# Patient Record
Sex: Male | Born: 1935 | Race: White | Hispanic: No | Marital: Married | Smoking: Former smoker
Health system: Southern US, Community
[De-identification: ages and names within clinical notes are randomized; demographics above are authoritative.]

## PROBLEM LIST (undated history)

## (undated) DIAGNOSIS — J189 Pneumonia, unspecified organism: Secondary | ICD-10-CM

## (undated) DIAGNOSIS — A809 Acute poliomyelitis, unspecified: Secondary | ICD-10-CM

## (undated) DIAGNOSIS — M419 Scoliosis, unspecified: Secondary | ICD-10-CM

## (undated) DIAGNOSIS — M810 Age-related osteoporosis without current pathological fracture: Secondary | ICD-10-CM

## (undated) DIAGNOSIS — F419 Anxiety disorder, unspecified: Secondary | ICD-10-CM

## (undated) DIAGNOSIS — C9110 Chronic lymphocytic leukemia of B-cell type not having achieved remission: Principal | ICD-10-CM

## (undated) HISTORY — DX: Chronic lymphocytic leukemia of B-cell type not having achieved remission: C91.10

## (undated) HISTORY — DX: Acute poliomyelitis, unspecified: A80.9

## (undated) HISTORY — PX: TONSILLECTOMY: SUR1361

---
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
## 1995-09-09 HISTORY — PX: RETINAL DETACHMENT SURGERY: SHX105

## 2002-04-04 ENCOUNTER — Emergency Department (HOSPITAL_COMMUNITY): Admission: EM | Admit: 2002-04-04 | Discharge: 2002-04-04 | Payer: Self-pay | Admitting: Emergency Medicine

## 2004-03-21 ENCOUNTER — Ambulatory Visit: Payer: Self-pay | Admitting: Internal Medicine

## 2004-04-01 ENCOUNTER — Ambulatory Visit: Payer: Self-pay | Admitting: Internal Medicine

## 2005-04-08 ENCOUNTER — Encounter: Admission: RE | Admit: 2005-04-08 | Discharge: 2005-07-07 | Payer: Self-pay | Admitting: Internal Medicine

## 2007-03-09 ENCOUNTER — Ambulatory Visit: Payer: Self-pay | Admitting: Internal Medicine

## 2007-03-12 HISTORY — PX: COLONOSCOPY: SHX174

## 2007-03-23 ENCOUNTER — Ambulatory Visit: Payer: Self-pay | Admitting: Internal Medicine

## 2007-03-23 ENCOUNTER — Encounter: Payer: Self-pay | Admitting: Internal Medicine

## 2010-07-09 ENCOUNTER — Emergency Department (HOSPITAL_BASED_OUTPATIENT_CLINIC_OR_DEPARTMENT_OTHER)
Admission: EM | Admit: 2010-07-09 | Discharge: 2010-07-09 | Disposition: A | Discharge status: Home / Self Care | Payer: Medicare Other | Attending: Emergency Medicine | Admitting: Emergency Medicine

## 2010-07-09 DIAGNOSIS — M810 Age-related osteoporosis without current pathological fracture: Secondary | ICD-10-CM | POA: Insufficient documentation

## 2010-07-09 DIAGNOSIS — F411 Generalized anxiety disorder: Secondary | ICD-10-CM | POA: Insufficient documentation

## 2010-07-09 DIAGNOSIS — W1809XA Striking against other object with subsequent fall, initial encounter: Secondary | ICD-10-CM | POA: Insufficient documentation

## 2010-07-09 DIAGNOSIS — S0180XA Unspecified open wound of other part of head, initial encounter: Secondary | ICD-10-CM | POA: Insufficient documentation

## 2010-07-09 DIAGNOSIS — Y92009 Unspecified place in unspecified non-institutional (private) residence as the place of occurrence of the external cause: Secondary | ICD-10-CM | POA: Insufficient documentation

## 2011-06-18 DIAGNOSIS — H35379 Puckering of macula, unspecified eye: Secondary | ICD-10-CM | POA: Diagnosis not present

## 2011-06-18 DIAGNOSIS — H43829 Vitreomacular adhesion, unspecified eye: Secondary | ICD-10-CM | POA: Diagnosis not present

## 2011-06-18 DIAGNOSIS — H2510 Age-related nuclear cataract, unspecified eye: Secondary | ICD-10-CM | POA: Diagnosis not present

## 2011-06-18 DIAGNOSIS — H33029 Retinal detachment with multiple breaks, unspecified eye: Secondary | ICD-10-CM | POA: Diagnosis not present

## 2011-07-21 DIAGNOSIS — Z125 Encounter for screening for malignant neoplasm of prostate: Secondary | ICD-10-CM | POA: Diagnosis not present

## 2011-07-21 DIAGNOSIS — F410 Panic disorder [episodic paroxysmal anxiety] without agoraphobia: Secondary | ICD-10-CM | POA: Diagnosis not present

## 2011-07-21 DIAGNOSIS — Z0000 Encounter for general adult medical examination without abnormal findings: Secondary | ICD-10-CM | POA: Diagnosis not present

## 2011-07-21 DIAGNOSIS — B91 Sequelae of poliomyelitis: Secondary | ICD-10-CM | POA: Diagnosis not present

## 2011-07-21 DIAGNOSIS — M810 Age-related osteoporosis without current pathological fracture: Secondary | ICD-10-CM | POA: Diagnosis not present

## 2011-07-28 DIAGNOSIS — R972 Elevated prostate specific antigen [PSA]: Secondary | ICD-10-CM | POA: Diagnosis not present

## 2011-07-28 DIAGNOSIS — N401 Enlarged prostate with lower urinary tract symptoms: Secondary | ICD-10-CM | POA: Diagnosis not present

## 2011-07-28 DIAGNOSIS — Z0000 Encounter for general adult medical examination without abnormal findings: Secondary | ICD-10-CM | POA: Diagnosis not present

## 2011-07-28 DIAGNOSIS — B91 Sequelae of poliomyelitis: Secondary | ICD-10-CM | POA: Diagnosis not present

## 2011-07-31 DIAGNOSIS — Z1212 Encounter for screening for malignant neoplasm of rectum: Secondary | ICD-10-CM | POA: Diagnosis not present

## 2011-08-11 DIAGNOSIS — M899 Disorder of bone, unspecified: Secondary | ICD-10-CM | POA: Diagnosis not present

## 2011-08-26 DIAGNOSIS — H35379 Puckering of macula, unspecified eye: Secondary | ICD-10-CM | POA: Diagnosis not present

## 2011-08-26 DIAGNOSIS — H2510 Age-related nuclear cataract, unspecified eye: Secondary | ICD-10-CM | POA: Diagnosis not present

## 2011-09-09 HISTORY — PX: CATARACT EXTRACTION: SUR2

## 2011-09-24 DIAGNOSIS — H2510 Age-related nuclear cataract, unspecified eye: Secondary | ICD-10-CM | POA: Diagnosis not present

## 2011-09-24 DIAGNOSIS — H43319 Vitreous membranes and strands, unspecified eye: Secondary | ICD-10-CM | POA: Diagnosis not present

## 2011-09-24 DIAGNOSIS — H35379 Puckering of macula, unspecified eye: Secondary | ICD-10-CM | POA: Diagnosis not present

## 2011-09-24 DIAGNOSIS — Z01818 Encounter for other preprocedural examination: Secondary | ICD-10-CM | POA: Diagnosis not present

## 2011-09-24 DIAGNOSIS — H269 Unspecified cataract: Secondary | ICD-10-CM | POA: Diagnosis not present

## 2011-09-28 DIAGNOSIS — F329 Major depressive disorder, single episode, unspecified: Secondary | ICD-10-CM | POA: Diagnosis not present

## 2011-09-28 DIAGNOSIS — A809 Acute poliomyelitis, unspecified: Secondary | ICD-10-CM | POA: Diagnosis not present

## 2011-09-28 DIAGNOSIS — M129 Arthropathy, unspecified: Secondary | ICD-10-CM | POA: Diagnosis not present

## 2011-09-28 DIAGNOSIS — H269 Unspecified cataract: Secondary | ICD-10-CM | POA: Diagnosis not present

## 2011-09-28 DIAGNOSIS — H43829 Vitreomacular adhesion, unspecified eye: Secondary | ICD-10-CM | POA: Diagnosis not present

## 2011-09-28 DIAGNOSIS — H35379 Puckering of macula, unspecified eye: Secondary | ICD-10-CM | POA: Diagnosis not present

## 2011-09-28 DIAGNOSIS — Z01810 Encounter for preprocedural cardiovascular examination: Secondary | ICD-10-CM | POA: Diagnosis not present

## 2011-09-28 DIAGNOSIS — H2510 Age-related nuclear cataract, unspecified eye: Secondary | ICD-10-CM | POA: Diagnosis not present

## 2011-09-28 DIAGNOSIS — F411 Generalized anxiety disorder: Secondary | ICD-10-CM | POA: Diagnosis not present

## 2011-09-28 DIAGNOSIS — H33029 Retinal detachment with multiple breaks, unspecified eye: Secondary | ICD-10-CM | POA: Diagnosis not present

## 2011-09-28 DIAGNOSIS — M810 Age-related osteoporosis without current pathological fracture: Secondary | ICD-10-CM | POA: Diagnosis not present

## 2011-09-29 DIAGNOSIS — H33029 Retinal detachment with multiple breaks, unspecified eye: Secondary | ICD-10-CM | POA: Diagnosis not present

## 2011-09-29 DIAGNOSIS — H43829 Vitreomacular adhesion, unspecified eye: Secondary | ICD-10-CM | POA: Diagnosis not present

## 2011-09-29 DIAGNOSIS — H2510 Age-related nuclear cataract, unspecified eye: Secondary | ICD-10-CM | POA: Diagnosis not present

## 2011-09-29 DIAGNOSIS — H35379 Puckering of macula, unspecified eye: Secondary | ICD-10-CM | POA: Diagnosis not present

## 2011-09-29 DIAGNOSIS — M810 Age-related osteoporosis without current pathological fracture: Secondary | ICD-10-CM | POA: Diagnosis not present

## 2011-09-29 DIAGNOSIS — A809 Acute poliomyelitis, unspecified: Secondary | ICD-10-CM | POA: Diagnosis not present

## 2011-10-07 DIAGNOSIS — H2510 Age-related nuclear cataract, unspecified eye: Secondary | ICD-10-CM | POA: Diagnosis not present

## 2011-10-07 DIAGNOSIS — Z48810 Encounter for surgical aftercare following surgery on the sense organs: Secondary | ICD-10-CM | POA: Diagnosis not present

## 2011-10-30 ENCOUNTER — Emergency Department (HOSPITAL_BASED_OUTPATIENT_CLINIC_OR_DEPARTMENT_OTHER)
Admission: EM | Admit: 2011-10-30 | Discharge: 2011-10-30 | Disposition: A | Discharge status: Home / Self Care | Payer: Medicare Other | Attending: Emergency Medicine | Admitting: Emergency Medicine

## 2011-10-30 ENCOUNTER — Encounter (HOSPITAL_BASED_OUTPATIENT_CLINIC_OR_DEPARTMENT_OTHER): Payer: Self-pay | Admitting: *Deleted

## 2011-10-30 DIAGNOSIS — W010XXA Fall on same level from slipping, tripping and stumbling without subsequent striking against object, initial encounter: Secondary | ICD-10-CM | POA: Insufficient documentation

## 2011-10-30 DIAGNOSIS — S0181XA Laceration without foreign body of other part of head, initial encounter: Secondary | ICD-10-CM

## 2011-10-30 DIAGNOSIS — S0180XA Unspecified open wound of other part of head, initial encounter: Secondary | ICD-10-CM | POA: Insufficient documentation

## 2011-10-30 DIAGNOSIS — Y92009 Unspecified place in unspecified non-institutional (private) residence as the place of occurrence of the external cause: Secondary | ICD-10-CM | POA: Insufficient documentation

## 2011-10-30 DIAGNOSIS — Z23 Encounter for immunization: Secondary | ICD-10-CM | POA: Insufficient documentation

## 2011-10-30 HISTORY — DX: Anxiety disorder, unspecified: F41.9

## 2011-10-30 HISTORY — DX: Age-related osteoporosis without current pathological fracture: M81.0

## 2011-10-30 MED ORDER — TETANUS-DIPHTH-ACELL PERTUSSIS 5-2.5-18.5 LF-MCG/0.5 IM SUSP
0.5000 mL | Freq: Once | INTRAMUSCULAR | Status: AC
  Administered 2011-10-30: 0.5 mL via INTRAMUSCULAR
  Filled 2011-10-30: qty 0.5

## 2011-10-30 NOTE — ED Notes (Signed)
Pt to triage with wife, smiling in nad. Pt reports was carrying items into the bathroom, and slipped on tile floor hitting chin on tile floor. Pt denies any other injury or c/o. dsd noted to chin, beneath noted approx 1/2" laceration with no active bleeding.

## 2011-10-30 NOTE — ED Provider Notes (Signed)
History     CSN: [id number]  Arrival date & time 10/30/11  1507   First MD Initiated Contact with Patient 10/30/11 1553      Chief Complaint  Patient presents with  . Head Injury    (Consider location/radiation/quality/duration/timing/severity/associated sxs/prior treatment) HPI This 78 year old male accidentally tripped and fell in his bathroom prior to arrival resulting in a small laceration on his chin which is painless. Is no foreign body sensation. He has no dental pain or facial bony pain. He has no headache no amnesia no lightheadedness no change in speech vision swallowing or understanding. He has weakness to his left arm and legs at baseline from polio. He is no numbness. He is no change in coordination. He has no new weakness. He has no neck pain back pain chest pain shortness breath or abdominal pain. He is no pain to his extremities. His only complaint is a small laceration on his chin which needs closure and needs a tetanus shot as well. This fall with chin laceration occurred just prior to arrival with no treatment prior to arrival. Past Medical History  Diagnosis Date  . Anxiety   . Osteoporosis     History reviewed. No pertinent past surgical history.  History reviewed. No pertinent family history.  History  Substance Use Topics  . Smoking status: Never Smoker   . Smokeless tobacco: Not on file  . Alcohol Use:       Review of Systems  Constitutional: Negative for fever.       10 Systems reviewed and are negative for acute change except as noted in the HPI.  HENT: Negative for congestion.   Eyes: Negative for discharge and redness.  Respiratory: Negative for cough and shortness of breath.   Cardiovascular: Negative for chest pain.  Gastrointestinal: Negative for vomiting and abdominal pain.  Musculoskeletal: Negative for back pain.  Skin: Positive for wound. Negative for rash.  Neurological: Negative for syncope, numbness and headaches.    Psychiatric/Behavioral:       No behavior change.    Allergies  Penicillins  Home Medications   Current Outpatient Rx  Name Route Sig Dispense Refill  . ALENDRONATE SODIUM 70 MG PO TABS Oral Take 70 mg by mouth every 7 (seven) days. Take with a full glass of water on an empty stomach.    Marland Kitchen LORAZEPAM 0.5 MG PO TABS Oral Take 0.5 mg by mouth every 8 (eight) hours.    Marland Kitchen PREDNISOLONE ACETATE 0.12 % OP SUSP Right Eye Place 1 drop into the right eye 4 (four) times daily.    . SERTRALINE HCL 50 MG PO TABS Oral Take 50 mg by mouth daily.      BP 132/82  Pulse 66  Temp 98.2 F (36.8 C) (Oral)  Resp 18  SpO2 100%  Physical Exam  Nursing note and vitals reviewed. Constitutional:       Awake, alert, nontoxic appearance with baseline speech for patient.  HENT:  Mouth/Throat: No oropharyngeal exudate.       Dentition intact, TMJs nontender, face nontender, normal jaw opening, normal jaw occlusion. His chin has a 1 cm nonbleeding laceration with no foreign body or significant deep structure involvement noted.  Eyes: EOM are normal. Pupils are equal, round, and reactive to light. Right eye exhibits no discharge. Left eye exhibits no discharge.  Neck: Neck supple.  Cardiovascular: Normal rate and regular rhythm.   No murmur heard. Pulmonary/Chest: Effort normal and breath sounds normal. No stridor. No respiratory distress. He  has no wheezes. He has no rales. He exhibits no tenderness.  Abdominal: Soft. Bowel sounds are normal. He exhibits no mass. There is no tenderness. There is no rebound.  Musculoskeletal: He exhibits no tenderness.       Baseline ROM, moves extremities with no obvious new focal weakness.  Lymphadenopathy:    He has no cervical adenopathy.  Neurological: He is alert.       Awake, alert, cooperative and aware of situation; motor strength baseline left arm and bilateral leg weakness; sensation normal to light touch bilaterally; peripheral visual fields full to  confrontation; no facial asymmetry; tongue midline; major cranial nerves appear intact; no pronator drift, normal finger to nose bilaterally  Skin: No rash noted.  Psychiatric: He has a normal mood and affect.    ED Course  Procedures (including critical care time) Time out taken, 1cm irregular chin laceration cleansed, chin laceration explored with no foreign body or deep structure involvement noted, laceration closed with good wound margin approximation with Dermabond which the patient tolerated well with no apparent immediate complications Labs Reviewed - No data to display No results found.   1. Chin laceration       MDM  Pt stable in ED with no significant deterioration in condition.Patient / Family / Caregiver informed of clinical course, understand medical decision-making process, and agree with plan.        Hurman Horn, MD 11/05/11 [phone]

## 2011-10-30 NOTE — ED Notes (Signed)
Pt states last td was >10 years ago.

## 2011-10-30 NOTE — Discharge Instructions (Signed)
You have had a head injury which does not appear to require admission at this time. A concussion is a state of changed mental ability from trauma. °SEEK IMMEDIATE MEDICAL ATTENTION IF: °There is confusion or drowsiness (although children frequently become drowsy after injury).  °You cannot awaken the injured person.  °There is nausea (feeling sick to your stomach) or continued, forceful vomiting.  °You notice dizziness or unsteadiness which is getting worse, or inability to walk.  °You have convulsions or unconsciousness.  °You experience severe, persistent headaches not relieved by Tylenol?. (Do not take aspirin as this impairs clotting abilities). Take other pain medications only as directed.  °You cannot use arms or legs normally.  °There are changes in pupil sizes. (This is the black center in the colored part of the eye)  °There is clear or bloody discharge from the nose or ears.  °Change in speech, vision, swallowing, or understanding.  °Localized weakness, numbness, tingling, or change in bowel or bladder control. ° °SEEK IMMEDIATE MEDICAL ATTENTION IF: °There is redness, swelling, increasing pain in the wound, or a red line that goes up your arm or leg.  °Pus is coming from wound.  °An unexplained temperature above 100.4 develops.  °You notice a foul smell coming from the wound from beneath the Dermabond.  °There is a breaking open of the wound (edged not staying together) and the Dermabond breaks open. °

## 2012-01-27 DIAGNOSIS — K5730 Diverticulosis of large intestine without perforation or abscess without bleeding: Secondary | ICD-10-CM | POA: Diagnosis not present

## 2012-01-27 DIAGNOSIS — Z23 Encounter for immunization: Secondary | ICD-10-CM | POA: Diagnosis not present

## 2012-01-27 DIAGNOSIS — D126 Benign neoplasm of colon, unspecified: Secondary | ICD-10-CM | POA: Diagnosis not present

## 2012-01-28 ENCOUNTER — Encounter: Payer: Self-pay | Admitting: Internal Medicine

## 2012-02-25 DIAGNOSIS — Z961 Presence of intraocular lens: Secondary | ICD-10-CM | POA: Diagnosis not present

## 2012-02-25 DIAGNOSIS — H33029 Retinal detachment with multiple breaks, unspecified eye: Secondary | ICD-10-CM | POA: Diagnosis not present

## 2012-02-25 DIAGNOSIS — H43829 Vitreomacular adhesion, unspecified eye: Secondary | ICD-10-CM | POA: Diagnosis not present

## 2012-02-25 DIAGNOSIS — H2510 Age-related nuclear cataract, unspecified eye: Secondary | ICD-10-CM | POA: Diagnosis not present

## 2012-02-25 DIAGNOSIS — H35379 Puckering of macula, unspecified eye: Secondary | ICD-10-CM | POA: Diagnosis not present

## 2012-03-09 ENCOUNTER — Ambulatory Visit (AMBULATORY_SURGERY_CENTER): Payer: Medicare Other | Admitting: *Deleted

## 2012-03-09 VITALS — Ht 71.0 in | Wt 205.0 lb

## 2012-03-09 DIAGNOSIS — Z86010 Personal history of colonic polyps: Secondary | ICD-10-CM

## 2012-03-09 DIAGNOSIS — A809 Acute poliomyelitis, unspecified: Secondary | ICD-10-CM | POA: Insufficient documentation

## 2012-03-09 DIAGNOSIS — Z1211 Encounter for screening for malignant neoplasm of colon: Secondary | ICD-10-CM

## 2012-03-09 MED ORDER — MOVIPREP 100 G PO SOLR: ORAL | Status: DC

## 2012-03-09 NOTE — Progress Notes (Signed)
During previsit today patient states he had eye surgery few months ago at [facility], he was told then by the anesthesiologist that he did not want to put him under general anesthesia due to having polio that he was breathing with diaphragm not with lungs. Patient then states he has had other surgeries without any problems. He has had previous colonoscopy also without any problems. During assessment today I did not see any signs of breathing problems,patient denies any history of breathing problems. Patient did have the eye surgery with local per patient.  This information sent to John Nulty,CRNA.

## 2012-03-23 ENCOUNTER — Ambulatory Visit (AMBULATORY_SURGERY_CENTER): Payer: Medicare Other | Admitting: Internal Medicine

## 2012-03-23 ENCOUNTER — Encounter: Payer: Self-pay | Admitting: Internal Medicine

## 2012-03-23 VITALS — BP 124/64 | HR 69 | Temp 98.6°F | Resp 15 | Ht 71.0 in | Wt 205.0 lb

## 2012-03-23 DIAGNOSIS — D126 Benign neoplasm of colon, unspecified: Secondary | ICD-10-CM

## 2012-03-23 DIAGNOSIS — Z860100 Personal history of colon polyps, unspecified: Secondary | ICD-10-CM

## 2012-03-23 DIAGNOSIS — F411 Generalized anxiety disorder: Secondary | ICD-10-CM | POA: Diagnosis not present

## 2012-03-23 DIAGNOSIS — Z800 Family history of malignant neoplasm of digestive organs: Secondary | ICD-10-CM | POA: Diagnosis not present

## 2012-03-23 DIAGNOSIS — Z1211 Encounter for screening for malignant neoplasm of colon: Secondary | ICD-10-CM | POA: Diagnosis not present

## 2012-03-23 MED ORDER — SODIUM CHLORIDE 0.9 % IV SOLN: 500.0000 mL | INTRAVENOUS | Status: DC

## 2012-03-23 NOTE — Progress Notes (Signed)
Patient did not experience any of the following events: a burn prior to discharge; a fall within the facility; wrong site/side/patient/procedure/implant event; or a hospital transfer or hospital admission upon discharge from the facility. (G8907) Patient did not have preoperative order for IV antibiotic SSI prophylaxis. (G8918)  

## 2012-03-23 NOTE — Patient Instructions (Addendum)
Findings:  Polyp, Diverticulosis Recommendations:  Wait for pathology results, Repeat colonoscopy in 5 years  YOU HAD AN ENDOSCOPIC PROCEDURE TODAY AT THE [facility]: Refer to the procedure report that was given to you for any specific questions about what was found during the examination.  If the procedure report does not answer your questions, please call your gastroenterologist to clarify.  If you requested that your care partner not be given the details of your procedure findings, then the procedure report has been included in a sealed envelope for you to review at your convenience later.  YOU SHOULD EXPECT: Some feelings of bloating in the abdomen. Passage of more gas than usual.  Walking can help get rid of the air that was put into your GI tract during the procedure and reduce the bloating. If you had a lower endoscopy (such as a colonoscopy or flexible sigmoidoscopy) you may notice spotting of blood in your stool or on the toilet paper. If you underwent a bowel prep for your procedure, then you may not have a normal bowel movement for a few days.  DIET: Your first meal following the procedure should be a light meal and then it is ok to progress to your normal diet.  A half-sandwich or bowl of soup is an example of a good first meal.  Heavy or fried foods are harder to digest and may make you feel nauseous or bloated.  Likewise meals heavy in dairy and vegetables can cause extra gas to form and this can also increase the bloating.  Drink plenty of fluids but you should avoid alcoholic beverages for 24 hours.  ACTIVITY: Your care partner should take you home directly after the procedure.  You should plan to take it easy, moving slowly for the rest of the day.  You can resume normal activity the day after the procedure however you should NOT DRIVE or use heavy machinery for 24 hours (because of the sedation medicines used during the test).    SYMPTOMS TO REPORT IMMEDIATELY: A  gastroenterologist can be reached at any hour.  During normal business hours, 8:30 AM to 5:00 PM Monday through Friday, call [phone].  After hours and on weekends, please call the GI answering service at [phone] who will take a message and have the physician on call contact you.   Following lower endoscopy (colonoscopy or flexible sigmoidoscopy):  Excessive amounts of blood in the stool  Significant tenderness or worsening of abdominal pains  Swelling of the abdomen that is new, acute  Fever of 100F or higher  Following upper endoscopy (EGD)  Vomiting of blood or coffee ground material  New chest pain or pain under the shoulder blades  Painful or persistently difficult swallowing  New shortness of breath  Fever of 100F or higher  Black, tarry-looking stools  FOLLOW UP: If any biopsies were taken you will be contacted by phone or by letter within the next 1-3 weeks.  Call your gastroenterologist if you have not heard about the biopsies in 3 weeks.  Our staff will call the home number listed on your records the next business day following your procedure to check on you and address any questions or concerns that you may have at that time regarding the information given to you following your procedure. This is a courtesy call and so if there is no answer at the home number and we have not heard from you through the emergency physician on call, we will assume that  you have returned to your regular daily activities without incident.  SIGNATURES/CONFIDENTIALITY: You and/or your care partner have signed paperwork which will be entered into your electronic medical record.  These signatures attest to the fact that that the information above on your After Visit Summary has been reviewed and is understood.  Full responsibility of the confidentiality of this discharge information lies with you and/or your care-partner.   Please follow all discharge instructions given to you by the recovery  room nurse. If you have any questions or problems after discharge please call one of the numbers listed above. You will receive a phone call in the am to see how you are doing and answer any questions you may have. Thank you for choosing [facility] for your health care needs.

## 2012-03-23 NOTE — Op Note (Signed)
[facility] [address] [place], [address]   COLONOSCOPY PROCEDURE REPORT  PATIENT: Manuel Reeves, Manuel Reeves  MR#: [id number] BIRTHDATE: 05/25/1932 , 79  yrs. old GENDER: Male ENDOSCOPIST: Hart Carwin, MD REFERRED BY:  Rodrigo Ran, M.D. PROCEDURE DATE:  03/23/2012 PROCEDURE:   Colonoscopy with snare polypectomy ASA CLASS:   Class II INDICATIONS:patient's immediate family history of colon cancer, patient's personal history of adenomatous colon polyps, and adenom polyps 2005 and 2008, sister with colon cancer. MEDICATIONS: MAC sedation, administered by CRNA and Fentanyl 150 mcg IV  DESCRIPTION OF PROCEDURE:   After the risks and benefits and of the procedure were explained, informed consent was obtained.  A digital rectal exam revealed no abnormalities of the rectum.    The LB [id number]  endoscope was introduced through the anus and advanced to the cecum, which was identified by both the appendix and ileocecal valve .  The quality of the prep was Moviprep fair . The instrument was then slowly withdrawn as the colon was fully examined.     COLON FINDINGS: A smooth sessile polyp ranging between 5-66mm in size was found at the hepatic flexure.  A polypectomy was performed with a cold snare.  The resection was complete and the polyp tissue was completely retrieved.   Moderate diverticulosis was noted. Retroflexed views revealed no abnormalities.     The scope was then withdrawn from the patient and the procedure completed.  COMPLICATIONS: There were no complications. ENDOSCOPIC IMPRESSION: 1.   Sessile polyp ranging between 5-38mm in size was found at the hepatic flexure; polypectomy was performed with a cold snare 2.   Moderate diverticulosis was noted  RECOMMENDATIONS: Await pathology results   REPEAT EXAM: In 5 year(s)  for Colonoscopy. , suggest additional prep  cc:  _______________________________ eSigned:  Hart Carwin, MD 03/23/2012  11:44 AM     PATIENT NAME:  Manuel Reeves, Manuel Reeves MR#: [id number]

## 2012-03-24 ENCOUNTER — Telehealth: Payer: Self-pay | Admitting: *Deleted

## 2012-03-24 NOTE — Telephone Encounter (Signed)
  Follow up Call-  Call back number 03/23/2012  Post procedure Call Back phone  # [phone] MAY SPEAK WITH WIFE  Permission to leave phone message Yes     Patient questions:  Do you have a fever, pain , or abdominal swelling? no Pain Score  0 *  Have you tolerated food without any problems? yes  Have you been able to return to your normal activities? yes  Do you have any questions about your discharge instructions: Diet   no Medications  no Follow up visit  no  Do you have questions or concerns about your Care? no  Actions: * If pain score is 4 or above: No action needed, pain <4.

## 2012-03-28 ENCOUNTER — Encounter: Payer: Self-pay | Admitting: Internal Medicine

## 2012-04-01 DIAGNOSIS — H35379 Puckering of macula, unspecified eye: Secondary | ICD-10-CM | POA: Diagnosis not present

## 2012-04-01 DIAGNOSIS — Z961 Presence of intraocular lens: Secondary | ICD-10-CM | POA: Diagnosis not present

## 2012-04-01 DIAGNOSIS — H2510 Age-related nuclear cataract, unspecified eye: Secondary | ICD-10-CM | POA: Diagnosis not present

## 2012-08-25 DIAGNOSIS — H43829 Vitreomacular adhesion, unspecified eye: Secondary | ICD-10-CM | POA: Diagnosis not present

## 2012-08-25 DIAGNOSIS — H35379 Puckering of macula, unspecified eye: Secondary | ICD-10-CM | POA: Diagnosis not present

## 2012-08-25 DIAGNOSIS — Z961 Presence of intraocular lens: Secondary | ICD-10-CM | POA: Diagnosis not present

## 2012-08-25 DIAGNOSIS — H2510 Age-related nuclear cataract, unspecified eye: Secondary | ICD-10-CM | POA: Diagnosis not present

## 2012-08-25 DIAGNOSIS — H33029 Retinal detachment with multiple breaks, unspecified eye: Secondary | ICD-10-CM | POA: Diagnosis not present

## 2012-09-22 DIAGNOSIS — R109 Unspecified abdominal pain: Secondary | ICD-10-CM | POA: Diagnosis not present

## 2012-09-22 DIAGNOSIS — R509 Fever, unspecified: Secondary | ICD-10-CM | POA: Diagnosis not present

## 2012-09-22 DIAGNOSIS — K5730 Diverticulosis of large intestine without perforation or abscess without bleeding: Secondary | ICD-10-CM | POA: Diagnosis not present

## 2012-09-22 DIAGNOSIS — K219 Gastro-esophageal reflux disease without esophagitis: Secondary | ICD-10-CM | POA: Diagnosis not present

## 2012-10-05 DIAGNOSIS — Z125 Encounter for screening for malignant neoplasm of prostate: Secondary | ICD-10-CM | POA: Diagnosis not present

## 2012-10-05 DIAGNOSIS — D539 Nutritional anemia, unspecified: Secondary | ICD-10-CM | POA: Diagnosis not present

## 2012-10-05 DIAGNOSIS — D7589 Other specified diseases of blood and blood-forming organs: Secondary | ICD-10-CM | POA: Diagnosis not present

## 2012-10-05 DIAGNOSIS — M810 Age-related osteoporosis without current pathological fracture: Secondary | ICD-10-CM | POA: Diagnosis not present

## 2012-10-05 DIAGNOSIS — Z79899 Other long term (current) drug therapy: Secondary | ICD-10-CM | POA: Diagnosis not present

## 2012-10-13 DIAGNOSIS — Z0000 Encounter for general adult medical examination without abnormal findings: Secondary | ICD-10-CM | POA: Diagnosis not present

## 2012-10-13 DIAGNOSIS — D7589 Other specified diseases of blood and blood-forming organs: Secondary | ICD-10-CM | POA: Diagnosis not present

## 2012-10-13 DIAGNOSIS — Z1331 Encounter for screening for depression: Secondary | ICD-10-CM | POA: Diagnosis not present

## 2012-10-13 DIAGNOSIS — Z6829 Body mass index (BMI) 29.0-29.9, adult: Secondary | ICD-10-CM | POA: Diagnosis not present

## 2012-10-13 DIAGNOSIS — M810 Age-related osteoporosis without current pathological fracture: Secondary | ICD-10-CM | POA: Diagnosis not present

## 2012-10-13 DIAGNOSIS — R972 Elevated prostate specific antigen [PSA]: Secondary | ICD-10-CM | POA: Diagnosis not present

## 2012-10-13 DIAGNOSIS — N401 Enlarged prostate with lower urinary tract symptoms: Secondary | ICD-10-CM | POA: Diagnosis not present

## 2012-10-13 DIAGNOSIS — B91 Sequelae of poliomyelitis: Secondary | ICD-10-CM | POA: Diagnosis not present

## 2012-12-05 DIAGNOSIS — IMO0001 Reserved for inherently not codable concepts without codable children: Secondary | ICD-10-CM | POA: Diagnosis not present

## 2013-01-13 DIAGNOSIS — Z6829 Body mass index (BMI) 29.0-29.9, adult: Secondary | ICD-10-CM | POA: Diagnosis not present

## 2013-01-13 DIAGNOSIS — J309 Allergic rhinitis, unspecified: Secondary | ICD-10-CM | POA: Diagnosis not present

## 2013-01-13 DIAGNOSIS — Z23 Encounter for immunization: Secondary | ICD-10-CM | POA: Diagnosis not present

## 2013-02-23 DIAGNOSIS — Z23 Encounter for immunization: Secondary | ICD-10-CM | POA: Diagnosis not present

## 2013-03-02 DIAGNOSIS — Z961 Presence of intraocular lens: Secondary | ICD-10-CM | POA: Diagnosis not present

## 2013-03-02 DIAGNOSIS — H33029 Retinal detachment with multiple breaks, unspecified eye: Secondary | ICD-10-CM | POA: Diagnosis not present

## 2013-03-02 DIAGNOSIS — H35379 Puckering of macula, unspecified eye: Secondary | ICD-10-CM | POA: Diagnosis not present

## 2013-03-02 DIAGNOSIS — H2510 Age-related nuclear cataract, unspecified eye: Secondary | ICD-10-CM | POA: Diagnosis not present

## 2013-03-02 DIAGNOSIS — H43829 Vitreomacular adhesion, unspecified eye: Secondary | ICD-10-CM | POA: Diagnosis not present

## 2013-08-31 DIAGNOSIS — H33029 Retinal detachment with multiple breaks, unspecified eye: Secondary | ICD-10-CM | POA: Diagnosis not present

## 2013-08-31 DIAGNOSIS — H43829 Vitreomacular adhesion, unspecified eye: Secondary | ICD-10-CM | POA: Diagnosis not present

## 2013-08-31 DIAGNOSIS — H35379 Puckering of macula, unspecified eye: Secondary | ICD-10-CM | POA: Diagnosis not present

## 2013-08-31 DIAGNOSIS — Z961 Presence of intraocular lens: Secondary | ICD-10-CM | POA: Diagnosis not present

## 2013-08-31 DIAGNOSIS — H2510 Age-related nuclear cataract, unspecified eye: Secondary | ICD-10-CM | POA: Diagnosis not present

## 2013-09-13 DIAGNOSIS — Z111 Encounter for screening for respiratory tuberculosis: Secondary | ICD-10-CM | POA: Diagnosis not present

## 2013-10-31 DIAGNOSIS — Z125 Encounter for screening for malignant neoplasm of prostate: Secondary | ICD-10-CM | POA: Diagnosis not present

## 2013-10-31 DIAGNOSIS — Z79899 Other long term (current) drug therapy: Secondary | ICD-10-CM | POA: Diagnosis not present

## 2013-10-31 DIAGNOSIS — F410 Panic disorder [episodic paroxysmal anxiety] without agoraphobia: Secondary | ICD-10-CM | POA: Diagnosis not present

## 2013-10-31 DIAGNOSIS — M810 Age-related osteoporosis without current pathological fracture: Secondary | ICD-10-CM | POA: Diagnosis not present

## 2013-10-31 DIAGNOSIS — D7589 Other specified diseases of blood and blood-forming organs: Secondary | ICD-10-CM | POA: Diagnosis not present

## 2013-11-07 DIAGNOSIS — Z0000 Encounter for general adult medical examination without abnormal findings: Secondary | ICD-10-CM | POA: Diagnosis not present

## 2013-11-07 DIAGNOSIS — R972 Elevated prostate specific antigen [PSA]: Secondary | ICD-10-CM | POA: Diagnosis not present

## 2013-11-07 DIAGNOSIS — N401 Enlarged prostate with lower urinary tract symptoms: Secondary | ICD-10-CM | POA: Diagnosis not present

## 2013-11-07 DIAGNOSIS — M810 Age-related osteoporosis without current pathological fracture: Secondary | ICD-10-CM | POA: Diagnosis not present

## 2013-11-07 DIAGNOSIS — K5730 Diverticulosis of large intestine without perforation or abscess without bleeding: Secondary | ICD-10-CM | POA: Diagnosis not present

## 2013-11-07 DIAGNOSIS — Z1331 Encounter for screening for depression: Secondary | ICD-10-CM | POA: Diagnosis not present

## 2013-11-07 DIAGNOSIS — F410 Panic disorder [episodic paroxysmal anxiety] without agoraphobia: Secondary | ICD-10-CM | POA: Diagnosis not present

## 2013-11-07 DIAGNOSIS — B91 Sequelae of poliomyelitis: Secondary | ICD-10-CM | POA: Diagnosis not present

## 2013-11-07 DIAGNOSIS — J309 Allergic rhinitis, unspecified: Secondary | ICD-10-CM | POA: Diagnosis not present

## 2013-11-13 DIAGNOSIS — Z1212 Encounter for screening for malignant neoplasm of rectum: Secondary | ICD-10-CM | POA: Diagnosis not present

## 2013-11-15 DIAGNOSIS — M810 Age-related osteoporosis without current pathological fracture: Secondary | ICD-10-CM | POA: Diagnosis not present

## 2013-11-17 ENCOUNTER — Emergency Department (HOSPITAL_COMMUNITY): Payer: Medicare Other

## 2013-11-17 ENCOUNTER — Encounter (HOSPITAL_COMMUNITY): Payer: Self-pay | Admitting: Emergency Medicine

## 2013-11-17 ENCOUNTER — Emergency Department (HOSPITAL_COMMUNITY)
Admission: EM | Admit: 2013-11-17 | Discharge: 2013-11-17 | Disposition: A | Discharge status: Home / Self Care | Payer: Medicare Other | Attending: Emergency Medicine | Admitting: Emergency Medicine

## 2013-11-17 DIAGNOSIS — F411 Generalized anxiety disorder: Secondary | ICD-10-CM | POA: Insufficient documentation

## 2013-11-17 DIAGNOSIS — M810 Age-related osteoporosis without current pathological fracture: Secondary | ICD-10-CM | POA: Diagnosis not present

## 2013-11-17 DIAGNOSIS — IMO0002 Reserved for concepts with insufficient information to code with codable children: Secondary | ICD-10-CM | POA: Insufficient documentation

## 2013-11-17 DIAGNOSIS — Z79899 Other long term (current) drug therapy: Secondary | ICD-10-CM | POA: Diagnosis not present

## 2013-11-17 DIAGNOSIS — J189 Pneumonia, unspecified organism: Secondary | ICD-10-CM

## 2013-11-17 DIAGNOSIS — J159 Unspecified bacterial pneumonia: Secondary | ICD-10-CM | POA: Insufficient documentation

## 2013-11-17 DIAGNOSIS — R0602 Shortness of breath: Secondary | ICD-10-CM | POA: Diagnosis not present

## 2013-11-17 DIAGNOSIS — Z8619 Personal history of other infectious and parasitic diseases: Secondary | ICD-10-CM | POA: Insufficient documentation

## 2013-11-17 DIAGNOSIS — R6889 Other general symptoms and signs: Secondary | ICD-10-CM | POA: Diagnosis not present

## 2013-11-17 DIAGNOSIS — Z880 Allergy status to penicillin: Secondary | ICD-10-CM | POA: Insufficient documentation

## 2013-11-17 MED ORDER — ALBUTEROL SULFATE HFA 108 (90 BASE) MCG/ACT IN AERS
1.0000 | INHALATION_SPRAY | RESPIRATORY_TRACT | Status: DC | PRN
  Administered 2013-11-17: 1 via RESPIRATORY_TRACT
  Filled 2013-11-17: qty 6.7

## 2013-11-17 MED ORDER — AZITHROMYCIN 250 MG PO TABS: ORAL_TABLET | ORAL | Status: DC

## 2013-11-17 MED ORDER — GUAIFENESIN ER 600 MG PO TB12: 1200.0000 mg | ORAL_TABLET | Freq: Two times a day (BID) | ORAL | Status: DC

## 2013-11-17 MED ORDER — AEROCHAMBER Z-STAT PLUS/MEDIUM MISC
Status: AC
  Administered 2013-11-17: 1
  Filled 2013-11-17: qty 1

## 2013-11-17 MED ORDER — AZITHROMYCIN 250 MG PO TABS
500.0000 mg | ORAL_TABLET | Freq: Once | ORAL | Status: AC
  Administered 2013-11-17: 500 mg via ORAL
  Filled 2013-11-17: qty 2

## 2013-11-17 MED ORDER — IPRATROPIUM-ALBUTEROL 0.5-2.5 (3) MG/3ML IN SOLN
3.0000 mL | Freq: Once | RESPIRATORY_TRACT | Status: AC
  Administered 2013-11-17: 3 mL via RESPIRATORY_TRACT
  Filled 2013-11-17: qty 3

## 2013-11-17 MED ORDER — GUAIFENESIN ER 600 MG PO TB12
1200.0000 mg | ORAL_TABLET | Freq: Two times a day (BID) | ORAL | Status: DC
  Administered 2013-11-17: 1200 mg via ORAL
  Filled 2013-11-17: qty 2

## 2013-11-17 NOTE — ED Notes (Signed)
Per EMS report: pt from home: pt c/o SOB but has a hx of anxiety.  EMS noted pt was not moving air as he was breathing but was able to speak in complete sentences without difficulty. EMS gave pt 5mg  albuterol. Pt sinus tach on the monitor. Pt a/o x 4. Pt hx of polio. Pt reports feeling better after the breathing tx.

## 2013-11-17 NOTE — ED Notes (Signed)
Bed: ZY24 Expected date: 11/17/13 Expected time: 1:50 AM Means of arrival: Ambulance Comments: Short of breath

## 2013-11-17 NOTE — ED Notes (Signed)
Patient transported to X-ray 

## 2013-11-17 NOTE — ED Provider Notes (Signed)
CSN: [id number]     Arrival date & time 11/17/13  0207 History   First MD Initiated Contact with Patient 11/17/13 0304     Chief Complaint  Patient presents with  . Shortness of Breath     (Consider location/radiation/quality/duration/timing/severity/associated sxs/prior Treatment) HPI 80 year old male presents to emergency department from home via EMS with complaint of shortness of breath.  Patient reports he's had a day and a half of chest congestion cough.  He reports fever of 100.9.  Patient has history of polio as a child, and reports that when he gets congested and has difficulties breathing he has panic attacks.  EMS gave albuterol neb in route, patient reports that he is feeling much better.  Wife reports that patient has been diaphoretic and wheezing throughout the day.  Patient has history of pneumonia in the past, reports he feels that way today.  Patient was seen by his primary care Dr. last week and had a clean bill of health. Past Medical History  Diagnosis Date  . Anxiety   . Osteoporosis   . Polio    Past Surgical History  Procedure Laterality Date  . Cataract extraction  09/2011    at [facility]  . Retinal detachment surgery  09/1995  . Spinal fusion  10/1952  . Colonoscopy  03/2007   Family History  Problem Relation Age of Onset  . Colon cancer Sister 32   History  Substance Use Topics  . Smoking status: Never Smoker   . Smokeless tobacco: Never Used  . Alcohol Use: Yes     Comment: occasional beer or wine manybe monthly    Review of Systems   See History of Present Illness; otherwise all other systems are reviewed and negative  Allergies  Penicillins  Home Medications   Prior to Admission medications   Medication Sig Start Date End Date Taking? Authorizing Provider  alendronate (FOSAMAX) 70 MG tablet Take 70 mg by mouth every 7 (seven) days. Take with a full glass of water on an empty stomach.   Yes Historical Provider, MD  buPROPion (WELLBUTRIN SR)  150 MG 12 hr tablet Take 1 tablet by mouth Daily. 02/24/12  Yes Historical Provider, MD  Calcium Carbonate-Vitamin D (CALTRATE 600+D PO) Take 1 tablet by mouth daily.   Yes Historical Provider, MD  Docusate Calcium (STOOL SOFTENER PO) Take 1 capsule by mouth daily.   Yes Historical Provider, MD  Ergocalciferol (VITAMIN D2 PO) Take 1.25 mg by mouth every 14 (fourteen) days.   Yes Historical Provider, MD  fluticasone (FLONASE) 50 MCG/ACT nasal spray Place 2 sprays into both nostrils daily.   Yes Historical Provider, MD  LORazepam (ATIVAN) 0.5 MG tablet Take 0.5 mg by mouth every 8 (eight) hours.   Yes Historical Provider, MD  Multiple Vitamin (MULTIVITAMIN) tablet Take 1 tablet by mouth daily.   Yes Historical Provider, MD  Multiple Vitamins-Minerals (OCUVITE PO) Take 1 tablet by mouth daily.   Yes Historical Provider, MD  polycarbophil (FIBERCON) 625 MG tablet Take 1 tablet by mouth daily.   Yes Historical Provider, MD  Polyethyl Glycol-Propyl Glycol (SYSTANE) 0.4-0.3 % SOLN Apply 1 drop to eye daily.   Yes Historical Provider, MD  sertraline (ZOLOFT) 50 MG tablet Take 50 mg by mouth daily.   Yes Historical Provider, MD   BP 144/66  Pulse 101  Temp(Src) 98.6 F (37 C) (Oral)  Resp 24  SpO2 100% Physical Exam  Nursing note and vitals reviewed. Constitutional: He is oriented to person, place,  and time. He appears well-developed and well-nourished.  HENT:  Head: Normocephalic and atraumatic.  Right Ear: External ear normal.  Left Ear: External ear normal.  Nose: Nose normal.  Mouth/Throat: Oropharynx is clear and moist.  Eyes: Conjunctivae and EOM are normal. Pupils are equal, round, and reactive to light.  Neck: Normal range of motion. Neck supple. No JVD present. No tracheal deviation present. No thyromegaly present.  Cardiovascular: Normal rate, regular rhythm, normal heart sounds and intact distal pulses.  Exam reveals no gallop and no friction rub.   No murmur heard. Pulmonary/Chest:  Effort normal and breath sounds normal. No stridor. No respiratory distress. He has no wheezes. He has no rales. He exhibits no tenderness.  Coarse breath sounds left lower lung base  Abdominal: Soft. Bowel sounds are normal. He exhibits no distension and no mass. There is no tenderness. There is no rebound and no guarding.  Musculoskeletal: Normal range of motion. He exhibits no edema and no tenderness.  Lymphadenopathy:    He has no cervical adenopathy.  Neurological: He is alert and oriented to person, place, and time. He exhibits normal muscle tone. Coordination normal.  Skin: Skin is warm and dry. No rash noted. No erythema. No pallor.  Psychiatric: He has a normal mood and affect. His behavior is normal. Judgment and thought content normal.    ED Course  Procedures (including critical care time) Labs Review Labs Reviewed - No data to display  Imaging Review Dg Chest 2 View  11/17/2013   CLINICAL DATA:  Sudden onset shortness of breath.  EXAM: CHEST  2 VIEW  COMPARISON:  None.  FINDINGS: Marked thoracolumbar scoliosis. Given this limitation, heart size and pulmonary vascularity appear normal. No focal airspace disease or consolidation is suggested in the lungs. No blunting of costophrenic angles. No apparent pneumothorax.  IMPRESSION: No active cardiopulmonary disease.   Electronically Signed   By: Lucienne Capers M.D.   On: 11/17/2013 03:46     EKG Interpretation None      MDM   Final diagnoses:  CAP (community acquired pneumonia)   80 year old male with shortness of breath.  Chest x-ray does not show a pneumonia, however given his severe scoliosis is a difficult film.  On physical exam he has findings consistent with a mild pneumonia.  Plan for treatment with Zithromax for community acquired pneumonia, with albuterol inhaler as needed for wheezing.  Pt has sats 92-95% on room air.  No dyspnea, speaking in full paragraphs, no c/o air hunger.  Pt wishes to go home.  Pt and wife  given precautions for return.  Kalman Drape, MD 11/17/13 [phone]

## 2013-11-17 NOTE — Discharge Instructions (Signed)
Take medications as prescribed.  Return to the ER for worsening condition or new concerning symptoms.  Touch base with your doctor to let him know you were seen in the ER.      Pneumonia, Adult Pneumonia is an infection of the lungs.  CAUSES Pneumonia may be caused by bacteria or a virus. Usually, these infections are caused by breathing infectious particles into the lungs (respiratory tract). SYMPTOMS   Cough.  Fever.  Chest pain.  Increased rate of breathing.  Wheezing.  Mucus production. DIAGNOSIS  If you have the common symptoms of pneumonia, your caregiver will typically confirm the diagnosis with a chest X-ray. The X-ray will show an abnormality in the lung (pulmonary infiltrate) if you have pneumonia. Other tests of your blood, urine, or sputum may be done to find the specific cause of your pneumonia. Your caregiver may also do tests (blood gases or pulse oximetry) to see how well your lungs are working. TREATMENT  Some forms of pneumonia may be spread to other people when you cough or sneeze. You may be asked to wear a mask before and during your exam. Pneumonia that is caused by bacteria is treated with antibiotic medicine. Pneumonia that is caused by the influenza virus may be treated with an antiviral medicine. Most other viral infections must run their course. These infections will not respond to antibiotics.  PREVENTION A pneumococcal shot (vaccine) is available to prevent a common bacterial cause of pneumonia. This is usually suggested for:  People over 74 years old.  Patients on chemotherapy.  People with chronic lung problems, such as bronchitis or emphysema.  People with immune system problems. If you are over 65 or have a high risk condition, you may receive the pneumococcal vaccine if you have not received it before. In some countries, a routine influenza vaccine is also recommended. This vaccine can help prevent some cases of pneumonia.You may be offered the  influenza vaccine as part of your care. If you smoke, it is time to quit. You may receive instructions on how to stop smoking. Your caregiver can provide medicines and counseling to help you quit. HOME CARE INSTRUCTIONS   Cough suppressants may be used if you are losing too much rest. However, coughing protects you by clearing your lungs. You should avoid using cough suppressants if you can.  Your caregiver may have prescribed medicine if he or she thinks your pneumonia is caused by a bacteria or influenza. Finish your medicine even if you start to feel better.  Your caregiver may also prescribe an expectorant. This loosens the mucus to be coughed up.  Only take over-the-counter or prescription medicines for pain, discomfort, or fever as directed by your caregiver.  Do not smoke. Smoking is a common cause of bronchitis and can contribute to pneumonia. If you are a smoker and continue to smoke, your cough may last several weeks after your pneumonia has cleared.  A cold steam vaporizer or humidifier in your room or home may help loosen mucus.  Coughing is often worse at night. Sleeping in a semi-upright position in a recliner or using a couple pillows under your head will help with this.  Get rest as you feel it is needed. Your body will usually let you know when you need to rest. SEEK IMMEDIATE MEDICAL CARE IF:   Your illness becomes worse. This is especially true if you are elderly or weakened from any other disease.  You cannot control your cough with suppressants and are losing  sleep.  You begin coughing up blood.  You develop pain which is getting worse or is uncontrolled with medicines.  You have a fever.  Any of the symptoms which initially brought you in for treatment are getting worse rather than better.  You develop shortness of breath or chest pain. MAKE SURE YOU:   Understand these instructions.  Will watch your condition.  Will get help right away if you are not  doing well or get worse. Document Released: 04/27/2005 Document Revised: 07/20/2011 Document Reviewed: 07/17/2010 [facility] Patient Information 2015 [place], [place]. This information is not intended to replace advice given to you by your health care provider. Make sure you discuss any questions you have with your health care provider.

## 2013-11-20 DIAGNOSIS — Z6829 Body mass index (BMI) 29.0-29.9, adult: Secondary | ICD-10-CM | POA: Diagnosis not present

## 2013-11-20 DIAGNOSIS — J189 Pneumonia, unspecified organism: Secondary | ICD-10-CM | POA: Diagnosis not present

## 2014-01-17 DIAGNOSIS — M6281 Muscle weakness (generalized): Secondary | ICD-10-CM | POA: Diagnosis not present

## 2014-01-18 DIAGNOSIS — M6281 Muscle weakness (generalized): Secondary | ICD-10-CM | POA: Diagnosis not present

## 2014-01-22 DIAGNOSIS — M6281 Muscle weakness (generalized): Secondary | ICD-10-CM | POA: Diagnosis not present

## 2014-01-24 DIAGNOSIS — M6281 Muscle weakness (generalized): Secondary | ICD-10-CM | POA: Diagnosis not present

## 2014-01-25 DIAGNOSIS — M6281 Muscle weakness (generalized): Secondary | ICD-10-CM | POA: Diagnosis not present

## 2014-01-29 DIAGNOSIS — M6281 Muscle weakness (generalized): Secondary | ICD-10-CM | POA: Diagnosis not present

## 2014-01-31 DIAGNOSIS — M6281 Muscle weakness (generalized): Secondary | ICD-10-CM | POA: Diagnosis not present

## 2014-02-01 DIAGNOSIS — M6281 Muscle weakness (generalized): Secondary | ICD-10-CM | POA: Diagnosis not present

## 2014-02-07 DIAGNOSIS — M6281 Muscle weakness (generalized): Secondary | ICD-10-CM | POA: Diagnosis not present

## 2014-02-08 DIAGNOSIS — M6281 Muscle weakness (generalized): Secondary | ICD-10-CM | POA: Diagnosis not present

## 2014-02-12 DIAGNOSIS — Z23 Encounter for immunization: Secondary | ICD-10-CM | POA: Diagnosis not present

## 2014-02-12 DIAGNOSIS — D72820 Lymphocytosis (symptomatic): Secondary | ICD-10-CM | POA: Diagnosis not present

## 2014-02-14 DIAGNOSIS — M6281 Muscle weakness (generalized): Secondary | ICD-10-CM | POA: Diagnosis not present

## 2014-02-15 DIAGNOSIS — M6281 Muscle weakness (generalized): Secondary | ICD-10-CM | POA: Diagnosis not present

## 2014-02-20 ENCOUNTER — Encounter: Payer: Self-pay | Admitting: Hematology and Oncology

## 2014-02-20 ENCOUNTER — Ambulatory Visit: Payer: Medicare Other

## 2014-02-20 ENCOUNTER — Ambulatory Visit (HOSPITAL_BASED_OUTPATIENT_CLINIC_OR_DEPARTMENT_OTHER): Payer: Medicare Other | Admitting: Hematology and Oncology

## 2014-02-20 ENCOUNTER — Telehealth: Payer: Self-pay | Admitting: Hematology and Oncology

## 2014-02-20 VITALS — BP 138/69 | HR 87 | Temp 98.4°F | Resp 18 | Ht 71.0 in

## 2014-02-20 DIAGNOSIS — C9110 Chronic lymphocytic leukemia of B-cell type not having achieved remission: Secondary | ICD-10-CM | POA: Insufficient documentation

## 2014-02-20 HISTORY — DX: Chronic lymphocytic leukemia of B-cell type not having achieved remission: C91.10

## 2014-02-20 NOTE — Telephone Encounter (Signed)
, °

## 2014-02-20 NOTE — Progress Notes (Signed)
Checked in new patient with no financial issues prior to seeing the dr. He has prim and secondary. Has not been out of the country and has his appt card.

## 2014-02-20 NOTE — Assessment & Plan Note (Signed)
I suspect he has CLL given without additional workup. I felt that it is more meaningful to do blood work in December so that it would give me another point with reference to assess for disease progression. I will order flow cytometry and FISH panel when I see him back. Clinically, he is at stage 0. I reinforced the importance of preventive care due to the increased risk of infection and secondary malignancy with CLL patients. He has completed his vaccination program. He maybe benefit from Prevnar injection when he sees his PCP next year. I recommend against shingle vaccine. I recommend he contacts his dermatologist for annual skin check due increased risk of skin cancer.

## 2014-02-20 NOTE — Progress Notes (Signed)
[facility] CONSULT NOTE  Patient Care Team: Jerlyn Ly, MD as PCP - General (Internal Medicine)  CHIEF COMPLAINTS/PURPOSE OF CONSULTATION:  Leukocytosis, suspicious for CLL  HISTORY OF PRESENTING ILLNESS:  Manuel Reeves 80 y.o. male is here because of elevated WBC.  He was found to have abnormal CBC from routine blood work monitoring from his primary care physician. His last set of blood work show elevated total blood cell count of 14.7 with predominant lymphocytosis. Smudge cells were seen. He denies recent infection. The last prescription antibiotics was more than 3 months ago There is not reported symptoms of sinus congestion, cough, urinary frequency/urgency or dysuria, diarrhea, joint swelling/pain or abnormal skin rash.  He had no prior history or diagnosis of cancer. His age appropriate screening programs are up-to-date. The patient has no prior diagnosis of autoimmune disease and was not prescribed corticosteroids related products.  MEDICAL HISTORY:  Past Medical History  Diagnosis Date  . Anxiety   . Osteoporosis   . Polio   . CLL (chronic lymphocytic leukemia) 02/20/2014    SURGICAL HISTORY: Past Surgical History  Procedure Laterality Date  . Cataract extraction  09/2011    at [facility]  . Retinal detachment surgery  09/1995  . Spinal fusion  10/1952  . Colonoscopy  03/2007    SOCIAL HISTORY: History   Social History  . Marital Status: Married    Spouse Name: N/A    Number of Children: N/A  . Years of Education: N/A   Occupational History  . Not on file.   Social History Main Topics  . Smoking status: Never Smoker   . Smokeless tobacco: Never Used  . Alcohol Use: Yes     Comment: occasional beer or wine manybe monthly  . Drug Use: No  . Sexual Activity: Not on file   Other Topics Concern  . Not on file   Social History Narrative  . No narrative on file    FAMILY HISTORY: Family History  Problem Relation Age of Onset  .  Colon cancer Sister 24    ALLERGIES:  is allergic to penicillins.  MEDICATIONS:  Current Outpatient Prescriptions  Medication Sig Dispense Refill  . buPROPion (WELLBUTRIN SR) 150 MG 12 hr tablet Take 1 tablet by mouth Daily.      . Calcium Carbonate-Vitamin D (CALTRATE 600+D PO) Take 1 tablet by mouth daily.      Mariane Baumgarten Calcium (STOOL SOFTENER PO) Take 1 capsule by mouth daily.      . Ergocalciferol (VITAMIN D2 PO) Take 1.25 mg by mouth every 14 (fourteen) days.      . fluticasone (FLONASE) 50 MCG/ACT nasal spray Place 2 sprays into both nostrils daily.      Marland Kitchen LORazepam (ATIVAN) 0.5 MG tablet Take 0.5 mg by mouth every 8 (eight) hours.      . Multiple Vitamin (MULTIVITAMIN) tablet Take 1 tablet by mouth daily.      . Multiple Vitamins-Minerals (OCUVITE PO) Take 1 tablet by mouth daily.      . polycarbophil (FIBERCON) 625 MG tablet Take 1 tablet by mouth daily.      . sertraline (ZOLOFT) 50 MG tablet Take 50 mg by mouth daily.      Marland Kitchen guaiFENesin (MUCINEX) 600 MG 12 hr tablet Take 2 tablets (1,200 mg total) by mouth 2 (two) times daily.  60 tablet  0   No current facility-administered medications for this visit.    REVIEW OF SYSTEMS:   Constitutional: Denies  fevers, chills or abnormal night sweats Eyes: Denies blurriness of vision, double vision or watery eyes Ears, nose, mouth, throat, and face: Denies mucositis or sore throat Respiratory: Denies cough, dyspnea or wheezes Cardiovascular: Denies palpitation, chest discomfort or lower extremity swelling Gastrointestinal:  Denies nausea, heartburn or change in bowel habits Skin: Denies abnormal skin rashes Lymphatics: Denies new lymphadenopathy or easy bruising Neurological:Denies numbness, tingling or new weaknesses Behavioral/Psych: Mood is stable, no new changes  All other systems were reviewed with the patient and are negative.  PHYSICAL EXAMINATION: ECOG PERFORMANCE STATUS: 0 - Asymptomatic  Filed Vitals:   02/20/14 1239   BP: 138/69  Pulse: 87  Temp: 98.4 F (36.9 C)  Resp: 18   Filed Weights    GENERAL:alert, no distress and comfortable. He is wheelchair-bound due to polio SKIN: skin color, texture, turgor are normal, no rashes or significant lesions EYES: normal, conjunctiva are pink and non-injected, sclera clear OROPHARYNX:no exudate, no erythema and lips, buccal mucosa, and tongue normal  NECK: supple, thyroid normal size, non-tender, without nodularity LYMPH:  no palpable lymphadenopathy in the cervical, axillary or inguinal LUNGS: clear to auscultation and percussion with normal breathing effort HEART: regular rate & rhythm and no murmurs and no lower extremity edema ABDOMEN:abdomen soft, non-tender and normal bowel sounds Musculoskeletal:no cyanosis of digits and no clubbing  PSYCH: alert & oriented x 3 with fluent speech NEURO: no focal motor/sensory deficits. Full neuro exam was not performed due to his wheelchair-bound status  LABORATORY DATA:  I have reviewed the data as listed ASSESSMENT & PLAN CLL (chronic lymphocytic leukemia) I suspect he has CLL given without additional workup. I felt that it is more meaningful to do blood work in December so that it would give me another point with reference to assess for disease progression. I will order flow cytometry and FISH panel when I see him back. Clinically, he is at stage 0. I reinforced the importance of preventive care due to the increased risk of infection and secondary malignancy with CLL patients. He has completed his vaccination program. He maybe benefit from Prevnar injection when he sees his PCP next year. I recommend against shingle vaccine. I recommend he contacts his dermatologist for annual skin check due increased risk of skin cancer.

## 2014-05-07 ENCOUNTER — Other Ambulatory Visit (HOSPITAL_COMMUNITY)
Admission: RE | Admit: 2014-05-07 | Discharge: 2014-05-07 | Disposition: A | Discharge status: Home / Self Care | Payer: Medicare Other | Source: Ambulatory Visit | Attending: Hematology and Oncology | Admitting: Hematology and Oncology

## 2014-05-07 ENCOUNTER — Encounter: Payer: Self-pay | Admitting: Hematology and Oncology

## 2014-05-07 ENCOUNTER — Ambulatory Visit (HOSPITAL_BASED_OUTPATIENT_CLINIC_OR_DEPARTMENT_OTHER): Payer: Medicare Other | Admitting: Hematology and Oncology

## 2014-05-07 ENCOUNTER — Telehealth: Payer: Self-pay | Admitting: Hematology and Oncology

## 2014-05-07 ENCOUNTER — Other Ambulatory Visit (HOSPITAL_BASED_OUTPATIENT_CLINIC_OR_DEPARTMENT_OTHER): Payer: Medicare Other

## 2014-05-07 VITALS — BP 132/61 | HR 72 | Temp 98.4°F | Resp 18 | Ht 71.0 in

## 2014-05-07 DIAGNOSIS — C9110 Chronic lymphocytic leukemia of B-cell type not having achieved remission: Secondary | ICD-10-CM

## 2014-05-07 DIAGNOSIS — D72820 Lymphocytosis (symptomatic): Secondary | ICD-10-CM | POA: Diagnosis not present

## 2014-05-07 LAB — COMPREHENSIVE METABOLIC PANEL (CC13)
ALT: 17 U/L (ref 0–55)
ANION GAP: 4 meq/L (ref 3–11)
AST: 17 U/L (ref 5–34)
Albumin: 3.8 g/dL (ref 3.5–5.0)
Alkaline Phosphatase: 103 U/L (ref 40–150)
BILIRUBIN TOTAL: 0.32 mg/dL (ref 0.20–1.20)
BUN: 16.4 mg/dL (ref 7.0–26.0)
CALCIUM: 9.4 mg/dL (ref 8.4–10.4)
CO2: 32 mEq/L — ABNORMAL HIGH (ref 22–29)
Chloride: 105 mEq/L (ref 98–109)
Creatinine: 0.7 mg/dL (ref 0.7–1.3)
EGFR: >90 mL/min/{1.73_m2} (ref >90)
Glucose: 98 mg/dl (ref 70–140)
Potassium: 4.5 mEq/L (ref 3.5–5.1)
Sodium: 142 mEq/L (ref 136–145)
Total Protein: 6.2 g/dL — ABNORMAL LOW (ref 6.4–8.3)

## 2014-05-07 LAB — CBC & DIFF AND RETIC
BASO%: 0.1 % (ref 0.0–2.0)
Basophils Absolute: 0 10*3/uL (ref 0.0–0.1)
EOS%: 1 % (ref 0.0–7.0)
Eosinophils Absolute: 0.1 10*3/uL (ref 0.0–0.5)
HCT: 45.9 % (ref 38.4–49.9)
HGB: 15 g/dL (ref 13.0–17.1)
Immature Retic Fract: 5.9 % (ref 3.00–10.60)
LYMPH#: 7.4 10*3/uL — AB (ref 0.9–3.3)
LYMPH%: 61.4 % — ABNORMAL HIGH (ref 14.0–49.0)
MCH: 32.4 pg (ref 27.2–33.4)
MCHC: 32.7 g/dL (ref 32.0–36.0)
MCV: 99.1 fL — ABNORMAL HIGH (ref 79.3–98.0)
MONO#: 0.4 10*3/uL (ref 0.1–0.9)
MONO%: 3.6 % (ref 0.0–14.0)
NEUT#: 4.1 10*3/uL (ref 1.5–6.5)
NEUT%: 33.9 % — ABNORMAL LOW (ref 39.0–75.0)
Platelets: 208 10*3/uL (ref 140–400)
RBC: 4.63 10*6/uL (ref 4.20–5.82)
RDW: 13.9 % (ref 11.0–14.6)
RETIC CT ABS: 68.99 10*3/uL (ref 34.80–93.90)
Retic %: 1.49 % (ref 0.80–1.80)
WBC: 12.1 10*3/uL — ABNORMAL HIGH (ref 4.0–10.3)

## 2014-05-07 LAB — LACTATE DEHYDROGENASE (CC13): LDH: 154 U/L (ref 125–245)

## 2014-05-07 NOTE — Telephone Encounter (Signed)
Gave avs & cal for Sept 2016.

## 2014-05-08 NOTE — Assessment & Plan Note (Signed)
Clinically, he is at stage 0. I reinforced the importance of preventive care due to the increased risk of infection and secondary malignancy with CLL patients. He has completed his vaccination program. I recommend he contacts his dermatologist for annual skin check due increased risk of skin cancer.

## 2014-05-08 NOTE — Progress Notes (Signed)
[place] OFFICE PROGRESS NOTE  Patient Care Team: Jerlyn Ly, MD as PCP - General (Internal Medicine) Heath Lark, MD as Consulting Physician (Hematology and Oncology)  SUMMARY OF ONCOLOGIC HISTORY:  He was found to have abnormal CBC from routine blood work monitoring from his primary care physician. His last set of blood work show elevated total blood cell count of 14.7 with predominant lymphocytosis. Smudge cells were seen. Flow cytometry confirmed diagnosis of CLL  INTERVAL HISTORY: Please see below for problem oriented charting. He feels fine. Denies new lymphadenopathy. Denies recent infection.  REVIEW OF SYSTEMS:   Constitutional: Denies fevers, chills or abnormal weight loss Eyes: Denies blurriness of vision Ears, nose, mouth, throat, and face: Denies mucositis or sore throat Respiratory: Denies cough, dyspnea or wheezes Cardiovascular: Denies palpitation, chest discomfort or lower extremity swelling Gastrointestinal:  Denies nausea, heartburn or change in bowel habits Skin: Denies abnormal skin rashes Lymphatics: Denies new lymphadenopathy or easy bruising Neurological:Denies numbness, tingling or new weaknesses Behavioral/Psych: Mood is stable, no new changes  All other systems were reviewed with the patient and are negative.  I have reviewed the past medical history, past surgical history, social history and family history with the patient and they are unchanged from previous note.  ALLERGIES:  is allergic to penicillins.  MEDICATIONS:  Current Outpatient Prescriptions  Medication Sig Dispense Refill  . buPROPion (WELLBUTRIN SR) 150 MG 12 hr tablet Take 1 tablet by mouth Daily.    . Calcium Carbonate-Vitamin D (CALTRATE 600+D PO) Take 1 tablet by mouth daily.    Mariane Baumgarten Calcium (STOOL SOFTENER PO) Take 1 capsule by mouth daily.    . Ergocalciferol (VITAMIN D2 PO) Take 1.25 mg by mouth every 14 (fourteen) days.    . fluticasone (FLONASE) 50  MCG/ACT nasal spray Place 2 sprays into both nostrils daily.    Marland Kitchen guaiFENesin (MUCINEX) 600 MG 12 hr tablet Take 2 tablets (1,200 mg total) by mouth 2 (two) times daily. 60 tablet 0  . LORazepam (ATIVAN) 0.5 MG tablet Take 0.5 mg by mouth every 8 (eight) hours.    . Multiple Vitamin (MULTIVITAMIN) tablet Take 1 tablet by mouth daily.    . Multiple Vitamins-Minerals (OCUVITE PO) Take 1 tablet by mouth daily.    . polycarbophil (FIBERCON) 625 MG tablet Take 1 tablet by mouth daily.    . sertraline (ZOLOFT) 50 MG tablet Take 50 mg by mouth daily.     No current facility-administered medications for this visit.    PHYSICAL EXAMINATION: ECOG PERFORMANCE STATUS: 0 - Asymptomatic  Filed Vitals:   05/07/14 1346  BP: 132/61  Pulse: 72  Temp: 98.4 F (36.9 C)  Resp: 18   Filed Weights    GENERAL:alert, no distress and comfortable. He is sitting on the wheelchair SKIN: skin color, texture, turgor are normal, no rashes or significant lesions EYES: normal, Conjunctiva are pink and non-injected, sclera clear OROPHARYNX:no exudate, no erythema and lips, buccal mucosa, and tongue normal  NECK: supple, thyroid normal size, non-tender, without nodularity LYMPH:  no palpable lymphadenopathy in the cervical, axillary or inguinal LUNGS: clear to auscultation and percussion with normal breathing effort HEART: regular rate & rhythm and no murmurs and no lower extremity edema ABDOMEN:abdomen soft, non-tender and normal bowel sounds Musculoskeletal:no cyanosis of digits and no clubbing  NEURO: alert & oriented x 3 with fluent speech  LABORATORY DATA:  I have reviewed the data as listed    Component Value Date/Time   NA 142 05/07/2014  1249   K 4.5 05/07/2014 1249   CO2 32* 05/07/2014 1249   GLUCOSE 98 05/07/2014 1249   BUN 16.4 05/07/2014 1249   CREATININE 0.7 05/07/2014 1249   CALCIUM 9.4 05/07/2014 1249   PROT 6.2* 05/07/2014 1249   ALBUMIN 3.8 05/07/2014 1249   AST 17 05/07/2014 1249    ALT 17 05/07/2014 1249   ALKPHOS 103 05/07/2014 1249   BILITOT 0.32 05/07/2014 1249    No results found for: SPEP, UPEP  Lab Results  Component Value Date   WBC 12.1* 05/07/2014   NEUTROABS 4.1 05/07/2014   HGB 15.0 05/07/2014   HCT 45.9 05/07/2014   MCV 99.1* 05/07/2014   PLT 208 05/07/2014      Chemistry      Component Value Date/Time   NA 142 05/07/2014 1249   K 4.5 05/07/2014 1249   CO2 32* 05/07/2014 1249   BUN 16.4 05/07/2014 1249   CREATININE 0.7 05/07/2014 1249      Component Value Date/Time   CALCIUM 9.4 05/07/2014 1249   ALKPHOS 103 05/07/2014 1249   AST 17 05/07/2014 1249   ALT 17 05/07/2014 1249   BILITOT 0.32 05/07/2014 1249    ASSESSMENT & PLAN:  CLL (chronic lymphocytic leukemia) Clinically, he is at stage 0. I reinforced the importance of preventive care due to the increased risk of infection and secondary malignancy with CLL patients. He has completed his vaccination program. I recommend he contacts his dermatologist for annual skin check due increased risk of skin cancer.   Orders Placed This Encounter  Procedures  . CBC with Differential    Standing Status: Future     Number of Occurrences:      Standing Expiration Date: 06/11/2015  . Comprehensive metabolic panel    Standing Status: Future     Number of Occurrences:      Standing Expiration Date: 06/11/2015  . Lactate dehydrogenase    Standing Status: Future     Number of Occurrences:      Standing Expiration Date: 06/11/2015   All questions were answered. The patient knows to call the clinic with any problems, questions or concerns. No barriers to learning was detected. I spent 15 minutes counseling the patient face to face. The total time spent in the appointment was 20 minutes and more than 50% was on counseling and review of test results     [facility], [place], MD 05/08/2014 8:18 PM

## 2014-05-09 LAB — FLOW CYTOMETRY

## 2014-05-14 LAB — FISH, PERIPHERAL BLOOD

## 2014-05-14 LAB — TISSUE HYBRIDIZATION TO NCBH

## 2014-05-31 ENCOUNTER — Encounter (HOSPITAL_COMMUNITY): Payer: Self-pay

## 2014-07-26 DIAGNOSIS — H33021 Retinal detachment with multiple breaks, right eye: Secondary | ICD-10-CM | POA: Diagnosis not present

## 2014-07-26 DIAGNOSIS — H2512 Age-related nuclear cataract, left eye: Secondary | ICD-10-CM | POA: Diagnosis not present

## 2014-07-26 DIAGNOSIS — Z961 Presence of intraocular lens: Secondary | ICD-10-CM | POA: Diagnosis not present

## 2014-07-26 DIAGNOSIS — H43823 Vitreomacular adhesion, bilateral: Secondary | ICD-10-CM | POA: Diagnosis not present

## 2014-07-26 DIAGNOSIS — H35373 Puckering of macula, bilateral: Secondary | ICD-10-CM | POA: Diagnosis not present

## 2014-10-05 DIAGNOSIS — H2512 Age-related nuclear cataract, left eye: Secondary | ICD-10-CM | POA: Diagnosis not present

## 2014-10-05 DIAGNOSIS — H35373 Puckering of macula, bilateral: Secondary | ICD-10-CM | POA: Diagnosis not present

## 2014-11-06 DIAGNOSIS — M810 Age-related osteoporosis without current pathological fracture: Secondary | ICD-10-CM | POA: Diagnosis not present

## 2014-11-06 DIAGNOSIS — F410 Panic disorder [episodic paroxysmal anxiety] without agoraphobia: Secondary | ICD-10-CM | POA: Diagnosis not present

## 2014-11-06 DIAGNOSIS — D7589 Other specified diseases of blood and blood-forming organs: Secondary | ICD-10-CM | POA: Diagnosis not present

## 2014-11-06 DIAGNOSIS — Z125 Encounter for screening for malignant neoplasm of prostate: Secondary | ICD-10-CM | POA: Diagnosis not present

## 2014-11-06 DIAGNOSIS — D72829 Elevated white blood cell count, unspecified: Secondary | ICD-10-CM | POA: Diagnosis not present

## 2014-11-06 DIAGNOSIS — Z79899 Other long term (current) drug therapy: Secondary | ICD-10-CM | POA: Diagnosis not present

## 2014-11-13 DIAGNOSIS — R972 Elevated prostate specific antigen [PSA]: Secondary | ICD-10-CM | POA: Diagnosis not present

## 2014-11-13 DIAGNOSIS — Z87438 Personal history of other diseases of male genital organs: Secondary | ICD-10-CM | POA: Diagnosis not present

## 2014-11-13 DIAGNOSIS — D126 Benign neoplasm of colon, unspecified: Secondary | ICD-10-CM | POA: Diagnosis not present

## 2014-11-13 DIAGNOSIS — M1990 Unspecified osteoarthritis, unspecified site: Secondary | ICD-10-CM | POA: Diagnosis not present

## 2014-11-13 DIAGNOSIS — Z0000 Encounter for general adult medical examination without abnormal findings: Secondary | ICD-10-CM | POA: Diagnosis not present

## 2014-11-13 DIAGNOSIS — D72820 Lymphocytosis (symptomatic): Secondary | ICD-10-CM | POA: Diagnosis not present

## 2014-11-13 DIAGNOSIS — K219 Gastro-esophageal reflux disease without esophagitis: Secondary | ICD-10-CM | POA: Diagnosis not present

## 2014-11-13 DIAGNOSIS — N401 Enlarged prostate with lower urinary tract symptoms: Secondary | ICD-10-CM | POA: Diagnosis not present

## 2014-11-13 DIAGNOSIS — G14 Postpolio syndrome: Secondary | ICD-10-CM | POA: Diagnosis not present

## 2014-11-13 DIAGNOSIS — Z1389 Encounter for screening for other disorder: Secondary | ICD-10-CM | POA: Diagnosis not present

## 2014-11-13 DIAGNOSIS — Z6829 Body mass index (BMI) 29.0-29.9, adult: Secondary | ICD-10-CM | POA: Diagnosis not present

## 2014-11-13 DIAGNOSIS — K5730 Diverticulosis of large intestine without perforation or abscess without bleeding: Secondary | ICD-10-CM | POA: Diagnosis not present

## 2014-11-19 DIAGNOSIS — F419 Anxiety disorder, unspecified: Secondary | ICD-10-CM | POA: Diagnosis not present

## 2014-11-19 DIAGNOSIS — A809 Acute poliomyelitis, unspecified: Secondary | ICD-10-CM | POA: Diagnosis not present

## 2014-11-19 DIAGNOSIS — Z981 Arthrodesis status: Secondary | ICD-10-CM | POA: Diagnosis not present

## 2014-11-19 DIAGNOSIS — F411 Generalized anxiety disorder: Secondary | ICD-10-CM | POA: Diagnosis not present

## 2014-11-19 DIAGNOSIS — H35373 Puckering of macula, bilateral: Secondary | ICD-10-CM | POA: Diagnosis not present

## 2014-11-19 DIAGNOSIS — Z79899 Other long term (current) drug therapy: Secondary | ICD-10-CM | POA: Diagnosis not present

## 2014-11-19 DIAGNOSIS — Z87891 Personal history of nicotine dependence: Secondary | ICD-10-CM | POA: Diagnosis not present

## 2014-11-19 DIAGNOSIS — Q158 Other specified congenital malformations of eye: Secondary | ICD-10-CM | POA: Diagnosis not present

## 2014-11-19 DIAGNOSIS — F329 Major depressive disorder, single episode, unspecified: Secondary | ICD-10-CM | POA: Diagnosis not present

## 2014-11-19 DIAGNOSIS — H2512 Age-related nuclear cataract, left eye: Secondary | ICD-10-CM | POA: Diagnosis not present

## 2014-11-19 DIAGNOSIS — M810 Age-related osteoporosis without current pathological fracture: Secondary | ICD-10-CM | POA: Diagnosis not present

## 2014-11-19 DIAGNOSIS — M1990 Unspecified osteoarthritis, unspecified site: Secondary | ICD-10-CM | POA: Diagnosis not present

## 2014-11-19 DIAGNOSIS — H35372 Puckering of macula, left eye: Secondary | ICD-10-CM | POA: Diagnosis not present

## 2014-11-20 DIAGNOSIS — Z87891 Personal history of nicotine dependence: Secondary | ICD-10-CM | POA: Diagnosis not present

## 2014-11-20 DIAGNOSIS — H35372 Puckering of macula, left eye: Secondary | ICD-10-CM | POA: Diagnosis not present

## 2014-11-20 DIAGNOSIS — Z9889 Other specified postprocedural states: Secondary | ICD-10-CM | POA: Diagnosis not present

## 2014-11-26 DIAGNOSIS — H2512 Age-related nuclear cataract, left eye: Secondary | ICD-10-CM | POA: Diagnosis not present

## 2014-11-26 DIAGNOSIS — H35372 Puckering of macula, left eye: Secondary | ICD-10-CM | POA: Diagnosis not present

## 2014-12-12 ENCOUNTER — Encounter: Payer: Self-pay | Admitting: Internal Medicine

## 2015-01-29 ENCOUNTER — Ambulatory Visit (HOSPITAL_BASED_OUTPATIENT_CLINIC_OR_DEPARTMENT_OTHER): Payer: Medicare Other | Admitting: Hematology and Oncology

## 2015-01-29 ENCOUNTER — Encounter: Payer: Self-pay | Admitting: Hematology and Oncology

## 2015-01-29 ENCOUNTER — Other Ambulatory Visit (HOSPITAL_BASED_OUTPATIENT_CLINIC_OR_DEPARTMENT_OTHER): Payer: Medicare Other

## 2015-01-29 ENCOUNTER — Telehealth: Payer: Self-pay | Admitting: Hematology and Oncology

## 2015-01-29 VITALS — BP 113/76 | HR 70 | Temp 98.3°F | Resp 18 | Ht 71.0 in

## 2015-01-29 DIAGNOSIS — C9110 Chronic lymphocytic leukemia of B-cell type not having achieved remission: Secondary | ICD-10-CM

## 2015-01-29 DIAGNOSIS — Z23 Encounter for immunization: Secondary | ICD-10-CM | POA: Diagnosis not present

## 2015-01-29 LAB — COMPREHENSIVE METABOLIC PANEL (CC13)
ALT: 15 U/L (ref 0–55)
ANION GAP: 6 meq/L (ref 3–11)
AST: 19 U/L (ref 5–34)
Albumin: 3.8 g/dL (ref 3.5–5.0)
Alkaline Phosphatase: 90 U/L (ref 40–150)
BUN: 16.6 mg/dL (ref 7.0–26.0)
CHLORIDE: 105 meq/L (ref 98–109)
CO2: 31 mEq/L — ABNORMAL HIGH (ref 22–29)
CREATININE: 0.7 mg/dL (ref 0.7–1.3)
Calcium: 9.4 mg/dL (ref 8.4–10.4)
EGFR: 89 mL/min/{1.73_m2} — ABNORMAL LOW (ref >90)
Glucose: 92 mg/dl (ref 70–140)
POTASSIUM: 4.6 meq/L (ref 3.5–5.1)
Sodium: 142 mEq/L (ref 136–145)
Total Bilirubin: 0.5 mg/dL (ref 0.20–1.20)
Total Protein: 6.2 g/dL — ABNORMAL LOW (ref 6.4–8.3)

## 2015-01-29 LAB — TECHNOLOGIST REVIEW

## 2015-01-29 LAB — LACTATE DEHYDROGENASE (CC13): LDH: 176 U/L (ref 125–245)

## 2015-01-29 LAB — CBC WITH DIFFERENTIAL/PLATELET
BASO%: 0.3 % (ref 0.0–2.0)
BASOS ABS: 0 10*3/uL (ref 0.0–0.1)
EOS ABS: 0.1 10*3/uL (ref 0.0–0.5)
EOS%: 1.1 % (ref 0.0–7.0)
HEMATOCRIT: 46.3 % (ref 38.4–49.9)
HGB: 15.3 g/dL (ref 13.0–17.1)
LYMPH#: 7.9 10*3/uL — AB (ref 0.9–3.3)
LYMPH%: 67.8 % — ABNORMAL HIGH (ref 14.0–49.0)
MCH: 31.9 pg (ref 27.2–33.4)
MCHC: 33 g/dL (ref 32.0–36.0)
MCV: 96.8 fL (ref 79.3–98.0)
MONO#: 0.3 10*3/uL (ref 0.1–0.9)
MONO%: 2.9 % (ref 0.0–14.0)
NEUT#: 3.2 10*3/uL (ref 1.5–6.5)
NEUT%: 27.9 % — AB (ref 39.0–75.0)
PLATELETS: 207 10*3/uL (ref 140–400)
RBC: 4.78 10*6/uL (ref 4.20–5.82)
RDW: 14.5 % (ref 11.0–14.6)
WBC: 11.6 10*3/uL — ABNORMAL HIGH (ref 4.0–10.3)

## 2015-01-29 NOTE — Telephone Encounter (Signed)
Gave and printed papt sched and avs for pt for Sept 2017

## 2015-01-29 NOTE — Progress Notes (Signed)
[place] OFFICE PROGRESS NOTE  Patient Care Team: Crist Infante, MD as PCP - General (Internal Medicine) Heath Lark, MD as Consulting Physician (Hematology and Oncology)  SUMMARY OF ONCOLOGIC HISTORY:   CLL (chronic lymphocytic leukemia)   02/20/2014 Initial Diagnosis CLL (chronic lymphocytic leukemia)   05/07/2014 Pathology Results FISH showed deletion 13q    INTERVAL HISTORY: Please see below for problem oriented charting. He feels well. Denies recent infection. No new lymphadenopathy. 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  REVIEW OF SYSTEMS:   Constitutional: Denies fevers, chills or abnormal weight loss Eyes: Denies blurriness of vision Ears, nose, mouth, throat, and face: Denies mucositis or sore throat Respiratory: Denies cough, dyspnea or wheezes Cardiovascular: Denies palpitation, chest discomfort or lower extremity swelling Gastrointestinal:  Denies nausea, heartburn or change in bowel habits Skin: Denies abnormal skin rashes Lymphatics: Denies new lymphadenopathy or easy bruising Neurological:Denies numbness, tingling or new weaknesses Behavioral/Psych: Mood is stable, no new changes  All other systems were reviewed with the patient and are negative.  I have reviewed the past medical history, past surgical history, social history and family history with the patient and they are unchanged from previous note.  ALLERGIES:  is allergic to penicillins.  MEDICATIONS:  Current Outpatient Prescriptions  Medication Sig Dispense Refill  . buPROPion (WELLBUTRIN SR) 150 MG 12 hr tablet Take 1 tablet by mouth Daily.    . Calcium Carbonate-Vitamin D (CALTRATE 600+D PO) Take 1 tablet by mouth daily.    Mariane Baumgarten Calcium (STOOL SOFTENER PO) Take 1 capsule by mouth daily.    . Ergocalciferol (VITAMIN D2 PO) Take 1.25 mg by mouth every 14 (fourteen) days.    Marland Kitchen LORazepam (ATIVAN) 0.5 MG tablet Take 0.5 mg by mouth every 8 (eight) hours.     . Multiple Vitamin (MULTIVITAMIN) tablet Take 1 tablet by mouth daily.    . Multiple Vitamins-Minerals (OCUVITE PO) Take 1 tablet by mouth daily.    . polycarbophil (FIBERCON) 625 MG tablet Take 1 tablet by mouth daily.    . sertraline (ZOLOFT) 50 MG tablet Take 50 mg by mouth daily.     No current facility-administered medications for this visit.    PHYSICAL EXAMINATION: ECOG PERFORMANCE STATUS: 0 - Asymptomatic  Filed Vitals:   01/29/15 1331  BP: 113/76  Pulse: 70  Temp: 98.3 F (36.8 C)  Resp: 18   Filed Weights    GENERAL:alert, no distress and comfortable. He looks well, sitting on a wheelchair SKIN: skin color, texture, turgor are normal, no rashes or significant lesions EYES: normal, Conjunctiva are pink and non-injected, sclera clear OROPHARYNX:no exudate, no erythema and lips, buccal mucosa, and tongue normal  NECK: supple, thyroid normal size, non-tender, without nodularity LYMPH:  no palpable lymphadenopathy in the cervical, axillary or inguinal LUNGS: clear to auscultation and percussion with normal breathing effort HEART: regular rate & rhythm and no murmurs and no lower extremity edema ABDOMEN:abdomen soft, non-tender and normal bowel sounds Musculoskeletal:no cyanosis of digits and no clubbing  NEURO: alert & oriented x 3 with fluent speech, no focal motor/sensory deficits  LABORATORY DATA:  I have reviewed the data as listed    Component Value Date/Time   NA 142 01/29/2015 1316   K 4.6 01/29/2015 1316   CO2 31* 01/29/2015 1316   GLUCOSE 92 01/29/2015 1316   BUN 16.6 01/29/2015 1316   CREATININE 0.7 01/29/2015 1316   CALCIUM 9.4 01/29/2015 1316   PROT 6.2* 01/29/2015 1316  ALBUMIN 3.8 01/29/2015 1316   AST 19 01/29/2015 1316   ALT 15 01/29/2015 1316   ALKPHOS 90 01/29/2015 1316   BILITOT 0.50 01/29/2015 1316    No results found for: SPEP, UPEP  Lab Results  Component Value Date   WBC 11.6* 01/29/2015   NEUTROABS 3.2 01/29/2015   HGB 15.3  01/29/2015   HCT 46.3 01/29/2015   MCV 96.8 01/29/2015   PLT 207 01/29/2015      Chemistry      Component Value Date/Time   NA 142 01/29/2015 1316   K 4.6 01/29/2015 1316   CO2 31* 01/29/2015 1316   BUN 16.6 01/29/2015 1316   CREATININE 0.7 01/29/2015 1316      Component Value Date/Time   CALCIUM 9.4 01/29/2015 1316   ALKPHOS 90 01/29/2015 1316   AST 19 01/29/2015 1316   ALT 15 01/29/2015 1316   BILITOT 0.50 01/29/2015 1316      ASSESSMENT & PLAN:  CLL (chronic lymphocytic leukemia) Clinically, he is at stage 0. I reinforced the importance of preventive care due to the increased risk of infection and secondary malignancy with CLL patients. He is due for vaccination. I recommend flu vaccine but he declined as he will receive that from PCP      Orders Placed This Encounter  Procedures  . CBC with Differential/Platelet    Standing Status: Future     Number of Occurrences:      Standing Expiration Date: 03/04/2016   All questions were answered. The patient knows to call the clinic with any problems, questions or concerns. No barriers to learning was detected. I spent 15 minutes counseling the patient face to face. The total time spent in the appointment was 20 minutes and more than 50% was on counseling and review of test results     [facility], [place], MD 01/29/2015 2:54 PM

## 2015-01-29 NOTE — Assessment & Plan Note (Signed)
Clinically, he is at stage 0. I reinforced the importance of preventive care due to the increased risk of infection and secondary malignancy with CLL patients. He is due for vaccination. I recommend flu vaccine but he declined as he will receive that from PCP

## 2015-05-16 DIAGNOSIS — Z6829 Body mass index (BMI) 29.0-29.9, adult: Secondary | ICD-10-CM | POA: Diagnosis not present

## 2015-05-16 DIAGNOSIS — D72820 Lymphocytosis (symptomatic): Secondary | ICD-10-CM | POA: Diagnosis not present

## 2015-05-16 DIAGNOSIS — M1990 Unspecified osteoarthritis, unspecified site: Secondary | ICD-10-CM | POA: Diagnosis not present

## 2015-05-16 DIAGNOSIS — G14 Postpolio syndrome: Secondary | ICD-10-CM | POA: Diagnosis not present

## 2015-05-30 DIAGNOSIS — Z961 Presence of intraocular lens: Secondary | ICD-10-CM | POA: Diagnosis not present

## 2015-05-30 DIAGNOSIS — H35373 Puckering of macula, bilateral: Secondary | ICD-10-CM | POA: Diagnosis not present

## 2015-12-17 DIAGNOSIS — D72829 Elevated white blood cell count, unspecified: Secondary | ICD-10-CM | POA: Diagnosis not present

## 2015-12-17 DIAGNOSIS — Z79899 Other long term (current) drug therapy: Secondary | ICD-10-CM | POA: Diagnosis not present

## 2015-12-17 DIAGNOSIS — M810 Age-related osteoporosis without current pathological fracture: Secondary | ICD-10-CM | POA: Diagnosis not present

## 2015-12-17 DIAGNOSIS — Z125 Encounter for screening for malignant neoplasm of prostate: Secondary | ICD-10-CM | POA: Diagnosis not present

## 2015-12-23 DIAGNOSIS — Z0000 Encounter for general adult medical examination without abnormal findings: Secondary | ICD-10-CM | POA: Diagnosis not present

## 2015-12-23 DIAGNOSIS — H3323 Serous retinal detachment, bilateral: Secondary | ICD-10-CM | POA: Diagnosis not present

## 2015-12-23 DIAGNOSIS — Z87438 Personal history of other diseases of male genital organs: Secondary | ICD-10-CM | POA: Diagnosis not present

## 2015-12-23 DIAGNOSIS — J3089 Other allergic rhinitis: Secondary | ICD-10-CM | POA: Diagnosis not present

## 2015-12-23 DIAGNOSIS — R972 Elevated prostate specific antigen [PSA]: Secondary | ICD-10-CM | POA: Diagnosis not present

## 2015-12-23 DIAGNOSIS — M419 Scoliosis, unspecified: Secondary | ICD-10-CM | POA: Diagnosis not present

## 2015-12-23 DIAGNOSIS — G14 Postpolio syndrome: Secondary | ICD-10-CM | POA: Diagnosis not present

## 2015-12-23 DIAGNOSIS — D72820 Lymphocytosis (symptomatic): Secondary | ICD-10-CM | POA: Diagnosis not present

## 2015-12-23 DIAGNOSIS — Z1389 Encounter for screening for other disorder: Secondary | ICD-10-CM | POA: Diagnosis not present

## 2015-12-23 DIAGNOSIS — F410 Panic disorder [episodic paroxysmal anxiety] without agoraphobia: Secondary | ICD-10-CM | POA: Diagnosis not present

## 2015-12-23 DIAGNOSIS — M810 Age-related osteoporosis without current pathological fracture: Secondary | ICD-10-CM | POA: Diagnosis not present

## 2015-12-23 DIAGNOSIS — M1990 Unspecified osteoarthritis, unspecified site: Secondary | ICD-10-CM | POA: Diagnosis not present

## 2016-01-16 DIAGNOSIS — Z23 Encounter for immunization: Secondary | ICD-10-CM | POA: Diagnosis not present

## 2016-01-28 ENCOUNTER — Encounter: Payer: Self-pay | Admitting: Hematology and Oncology

## 2016-01-28 ENCOUNTER — Ambulatory Visit (HOSPITAL_BASED_OUTPATIENT_CLINIC_OR_DEPARTMENT_OTHER): Payer: Medicare Other | Admitting: Hematology and Oncology

## 2016-01-28 ENCOUNTER — Telehealth: Payer: Self-pay | Admitting: Hematology and Oncology

## 2016-01-28 ENCOUNTER — Other Ambulatory Visit: Payer: No Typology Code available for payment source

## 2016-01-28 VITALS — BP 142/81 | HR 80 | Temp 97.8°F | Resp 18 | Ht 71.0 in

## 2016-01-28 DIAGNOSIS — Z23 Encounter for immunization: Secondary | ICD-10-CM

## 2016-01-28 DIAGNOSIS — C9110 Chronic lymphocytic leukemia of B-cell type not having achieved remission: Secondary | ICD-10-CM

## 2016-01-28 MED ORDER — INFLUENZA VAC SPLIT QUAD 0.5 ML IM SUSY
0.5000 mL | PREFILLED_SYRINGE | Freq: Once | INTRAMUSCULAR | Status: DC
  Filled 2016-01-28: qty 0.5

## 2016-01-28 NOTE — Telephone Encounter (Signed)
Gave patient avs report and appointments for October 2018.

## 2016-01-28 NOTE — Progress Notes (Signed)
[facility] OFF[facility] PROGRESS NOTE  Patient Care Team: Crist Infante, MD as PCP - General (Internal Medicine) Heath Lark, MD as Consulting Physician (Hematology and Oncology)  SUMMARY OF ONCOLOGIC HISTORY:   CLL (chronic lymphocytic leukemia) ([place])   02/20/2014 Initial Diagnosis    CLL (chronic lymphocytic leukemia)      05/07/2014 Pathology Results    FISH showed deletion 13q       INTERVAL HISTORY: Please see below for problem oriented charting. He feels well. No new lymphadenopathy. Denies recent infection. He feels well. Denies appetite change, weight loss or night sweats He has recent blood work done through his primary care doctor's office. White blood cell count was 13.8, hemoglobin 14.7 and platelet count 318.  REVIEW OF SYSTEMS:   Constitutional: Denies fevers, chills or abnormal weight loss Eyes: Denies blurriness of vision Ears, nose, mouth, throat, and face: Denies mucositis or sore throat Respiratory: Denies cough, dyspnea or wheezes Cardiovascular: Denies palpitation, chest discomfort or lower extremity swelling Gastrointestinal:  Denies nausea, heartburn or change in bowel habits Skin: Denies abnormal skin rashes Lymphatics: Denies new lymphadenopathy or easy bruising Neurological:Denies numbness, tingling or new weaknesses Behavioral/Psych: Mood is stable, no new changes  All other systems were reviewed with the patient and are negative.  I have reviewed the past medical history, past surgical history, social history and family history with the patient and they are unchanged from previous note.  ALLERGIES:  is allergic to penicillins.  MEDICATIONS:  Current Outpatient Prescriptions  Medication Sig Dispense Refill  . buPROPion (WELLBUTRIN SR) 150 MG 12 hr tablet Take 1 tablet by mouth Daily.    . Calcium Carbonate-Vitamin D (CALTRATE 600+D PO) Take 1 tablet by mouth daily.    Mariane Baumgarten Calcium (STOOL SOFTENER PO) Take 1 capsule by mouth  daily.    . Ergocalciferol (VITAMIN D2 PO) Take 1.25 mg by mouth every 14 (fourteen) days.    Marland Kitchen LORazepam (ATIVAN) 0.5 MG tablet Take 0.5 mg by mouth every 8 (eight) hours.    . Multiple Vitamin (MULTIVITAMIN) tablet Take 1 tablet by mouth daily.    . Multiple Vitamins-Minerals (OCUVITE PO) Take 1 tablet by mouth daily.    . polycarbophil (FIBERCON) 625 MG tablet Take 1 tablet by mouth daily.    . sertraline (ZOLOFT) 50 MG tablet Take 50 mg by mouth daily.     Current Facility-Administered Medications  Medication Dose Route Frequency Provider Last Rate Last Dose  . Influenza vac split quadrivalent PF (FLUARIX) injection 0.5 mL  0.5 mL Intramuscular Once Heath Lark, MD        PHYSICAL EXAMINATION: ECOG PERFORMANCE STATUS: 1 - Symptomatic but completely ambulatory  Vitals:   01/28/16 1330  BP: (!) 142/81  Pulse: 80  Resp: 18  Temp: 97.8 F (36.6 C)   Filed Weights    GENERAL:alert, no distress and comfortable. He is sitting on the wheelchair SKIN: skin color, texture, turgor are normal, no rashes or significant lesions EYES: normal, Conjunctiva are pink and non-injected, sclera clear OROPHARYNX:no exudate, no erythema and lips, buccal mucosa, and tongue normal  NECK: supple, thyroid normal size, non-tender, without nodularity LYMPH:  no palpable lymphadenopathy in the cervical, axillary or inguinal LUNGS: clear to auscultation and percussion with normal breathing effort HEART: regular rate & rhythm and no murmurs and no lower extremity edema ABDOMEN:abdomen soft, non-tender and normal bowel sounds Musculoskeletal:no cyanosis of digits and no clubbing  NEURO: alert & oriented x 3 with fluent speech, with muscle wasting  related to polio  LABORATORY DATA:  I have reviewed the data as listed    Component Value Date/Time   NA 142 01/29/2015 1316   K 4.6 01/29/2015 1316   CO2 31 (H) 01/29/2015 1316   GLUCOSE 92 01/29/2015 1316   BUN 16.6 01/29/2015 1316   CREATININE 0.7  01/29/2015 1316   CALCIUM 9.4 01/29/2015 1316   PROT 6.2 (L) 01/29/2015 1316   ALBUMIN 3.8 01/29/2015 1316   AST 19 01/29/2015 1316   ALT 15 01/29/2015 1316   ALKPHOS 90 01/29/2015 1316   BILITOT 0.50 01/29/2015 1316    No results found for: SPEP, UPEP  Lab Results  Component Value Date   WBC 11.6 (H) 01/29/2015   NEUTROABS 3.2 01/29/2015   HGB 15.3 01/29/2015   HCT 46.3 01/29/2015   MCV 96.8 01/29/2015   PLT 207 01/29/2015      Chemistry      Component Value Date/Time   NA 142 01/29/2015 1316   K 4.6 01/29/2015 1316   CO2 31 (H) 01/29/2015 1316   BUN 16.6 01/29/2015 1316   CREATININE 0.7 01/29/2015 1316      Component Value Date/Time   CALCIUM 9.4 01/29/2015 1316   ALKPHOS 90 01/29/2015 1316   AST 19 01/29/2015 1316   ALT 15 01/29/2015 1316   BILITOT 0.50 01/29/2015 1316     ASSESSMENT & PLAN:  CLL (chronic lymphocytic leukemia) Clinically, he is at stage 0. I reinforced the importance of preventive care due to the increased risk of infection and secondary malignancy with CLL patients. He had received influenza vaccination from PCP I will continue to see him every year with history and physical examination. He will continue blood work monitoring once a year through his primary care doctor's office.      No orders of the defined types were placed in this encounter.  All questions were answered. The patient knows to call the clinic with any problems, questions or concerns. No barriers to learning was detected. I spent 10 minutes counseling the patient face to face. The total time spent in the appointment was 15 minutes and more than 50% was on counseling and review of test results     [facility], [place], MD 01/28/2016 2:05 PM

## 2016-01-28 NOTE — Assessment & Plan Note (Signed)
Clinically, he is at stage 0. I reinforced the importance of preventive care due to the increased risk of infection and secondary malignancy with CLL patients. He had received influenza vaccination from PCP I will continue to see him every year with history and physical examination. He will continue blood work monitoring once a year through his primary care doctor's office.

## 2016-03-23 DIAGNOSIS — G14 Postpolio syndrome: Secondary | ICD-10-CM | POA: Diagnosis not present

## 2016-03-23 DIAGNOSIS — R2689 Other abnormalities of gait and mobility: Secondary | ICD-10-CM | POA: Diagnosis not present

## 2016-03-23 DIAGNOSIS — M810 Age-related osteoporosis without current pathological fracture: Secondary | ICD-10-CM | POA: Diagnosis not present

## 2016-03-23 DIAGNOSIS — M1990 Unspecified osteoarthritis, unspecified site: Secondary | ICD-10-CM | POA: Diagnosis not present

## 2016-03-23 DIAGNOSIS — R600 Localized edema: Secondary | ICD-10-CM | POA: Diagnosis not present

## 2016-03-23 DIAGNOSIS — M6281 Muscle weakness (generalized): Secondary | ICD-10-CM | POA: Diagnosis not present

## 2016-03-23 DIAGNOSIS — M419 Scoliosis, unspecified: Secondary | ICD-10-CM | POA: Diagnosis not present

## 2016-04-08 DIAGNOSIS — R29898 Other symptoms and signs involving the musculoskeletal system: Secondary | ICD-10-CM | POA: Diagnosis not present

## 2016-04-08 DIAGNOSIS — G14 Postpolio syndrome: Secondary | ICD-10-CM | POA: Diagnosis not present

## 2016-05-01 DIAGNOSIS — G14 Postpolio syndrome: Secondary | ICD-10-CM | POA: Diagnosis not present

## 2016-05-01 DIAGNOSIS — R29898 Other symptoms and signs involving the musculoskeletal system: Secondary | ICD-10-CM | POA: Diagnosis not present

## 2016-06-18 DIAGNOSIS — H35373 Puckering of macula, bilateral: Secondary | ICD-10-CM | POA: Diagnosis not present

## 2016-06-18 DIAGNOSIS — Z961 Presence of intraocular lens: Secondary | ICD-10-CM | POA: Diagnosis not present

## 2016-07-27 DIAGNOSIS — G14 Postpolio syndrome: Secondary | ICD-10-CM | POA: Diagnosis not present

## 2016-07-27 DIAGNOSIS — R29898 Other symptoms and signs involving the musculoskeletal system: Secondary | ICD-10-CM | POA: Diagnosis not present

## 2016-08-07 DIAGNOSIS — R278 Other lack of coordination: Secondary | ICD-10-CM | POA: Diagnosis not present

## 2016-08-07 DIAGNOSIS — M6281 Muscle weakness (generalized): Secondary | ICD-10-CM | POA: Diagnosis not present

## 2017-01-29 DIAGNOSIS — Z125 Encounter for screening for malignant neoplasm of prostate: Secondary | ICD-10-CM | POA: Diagnosis not present

## 2017-01-29 DIAGNOSIS — Z79899 Other long term (current) drug therapy: Secondary | ICD-10-CM | POA: Diagnosis not present

## 2017-01-29 DIAGNOSIS — M810 Age-related osteoporosis without current pathological fracture: Secondary | ICD-10-CM | POA: Diagnosis not present

## 2017-02-10 DIAGNOSIS — M6281 Muscle weakness (generalized): Secondary | ICD-10-CM | POA: Diagnosis not present

## 2017-02-10 DIAGNOSIS — R2689 Other abnormalities of gait and mobility: Secondary | ICD-10-CM | POA: Diagnosis not present

## 2017-02-10 DIAGNOSIS — M810 Age-related osteoporosis without current pathological fracture: Secondary | ICD-10-CM | POA: Diagnosis not present

## 2017-02-10 DIAGNOSIS — D72820 Lymphocytosis (symptomatic): Secondary | ICD-10-CM | POA: Diagnosis not present

## 2017-02-10 DIAGNOSIS — D7589 Other specified diseases of blood and blood-forming organs: Secondary | ICD-10-CM | POA: Diagnosis not present

## 2017-02-10 DIAGNOSIS — K5730 Diverticulosis of large intestine without perforation or abscess without bleeding: Secondary | ICD-10-CM | POA: Diagnosis not present

## 2017-02-10 DIAGNOSIS — G14 Postpolio syndrome: Secondary | ICD-10-CM | POA: Diagnosis not present

## 2017-02-10 DIAGNOSIS — Z0000 Encounter for general adult medical examination without abnormal findings: Secondary | ICD-10-CM | POA: Diagnosis not present

## 2017-02-10 DIAGNOSIS — J3089 Other allergic rhinitis: Secondary | ICD-10-CM | POA: Diagnosis not present

## 2017-02-10 DIAGNOSIS — R600 Localized edema: Secondary | ICD-10-CM | POA: Diagnosis not present

## 2017-02-10 DIAGNOSIS — Z1389 Encounter for screening for other disorder: Secondary | ICD-10-CM | POA: Diagnosis not present

## 2017-02-10 DIAGNOSIS — M1990 Unspecified osteoarthritis, unspecified site: Secondary | ICD-10-CM | POA: Diagnosis not present

## 2017-02-11 ENCOUNTER — Encounter: Payer: Self-pay | Admitting: Gastroenterology

## 2017-03-02 ENCOUNTER — Encounter: Payer: Self-pay | Admitting: Hematology and Oncology

## 2017-03-02 ENCOUNTER — Telehealth: Payer: Self-pay | Admitting: Hematology and Oncology

## 2017-03-02 ENCOUNTER — Ambulatory Visit (HOSPITAL_BASED_OUTPATIENT_CLINIC_OR_DEPARTMENT_OTHER): Payer: Medicare Other | Admitting: Hematology and Oncology

## 2017-03-02 DIAGNOSIS — C9110 Chronic lymphocytic leukemia of B-cell type not having achieved remission: Secondary | ICD-10-CM

## 2017-03-02 NOTE — Assessment & Plan Note (Signed)
Clinically, he is at stage 0. His blood works are stable with no signs of disease progression, anemia or thrombocytopenia. I reinforced the importance of preventive care due to the increased risk of infection and secondary malignancy with CLL patients. He had received influenza vaccination from PCP I will continue to see him every year with history and physical examination. He will continue blood work monitoring once a year through his primary care doctor's office.

## 2017-03-02 NOTE — Telephone Encounter (Signed)
Scheduled appt per 10/23 los. Patient is aware of appts

## 2017-03-02 NOTE — Progress Notes (Signed)
[place] OFFICE PROGRESS NOTE  Patient Care Team: Crist Infante, MD as PCP - General (Internal Medicine) Heath Lark, MD as Consulting Physician (Hematology and Oncology)  SUMMARY OF ONCOLOGIC HISTORY:   CLL (chronic lymphocytic leukemia) ([place])   02/20/2014 Initial Diagnosis    CLL (chronic lymphocytic leukemia)      05/07/2014 Pathology Results    FISH showed deletion 13q       INTERVAL HISTORY: Please see below for problem oriented charting. He returns with his wife for further follow-up He is busy at the current assisted living place He denies recent infection He is up-to-date with his vaccination programs No new lymphadenopathy, abnormal weight loss or night sweats  REVIEW OF SYSTEMS:   Constitutional: Denies fevers, chills or abnormal weight loss Eyes: Denies blurriness of vision Ears, nose, mouth, throat, and face: Denies mucositis or sore throat Respiratory: Denies cough, dyspnea or wheezes Cardiovascular: Denies palpitation, chest discomfort or lower extremity swelling Gastrointestinal:  Denies nausea, heartburn or change in bowel habits Skin: Denies abnormal skin rashes Lymphatics: Denies new lymphadenopathy or easy bruising Neurological:Denies numbness, tingling or new weaknesses Behavioral/Psych: Mood is stable, no new changes  All other systems were reviewed with the patient and are negative.  I have reviewed the past medical history, past surgical history, social history and family history with the patient and they are unchanged from previous note.  ALLERGIES:  is allergic to penicillins.  MEDICATIONS:  Current Outpatient Prescriptions  Medication Sig Dispense Refill  . buPROPion (WELLBUTRIN SR) 150 MG 12 hr tablet Take 1 tablet by mouth Daily.    . Calcium Carbonate-Vitamin D (CALTRATE 600+D PO) Take 1 tablet by mouth daily.    Mariane Baumgarten Calcium (STOOL SOFTENER PO) Take 1 capsule by mouth daily.    . Ergocalciferol (VITAMIN D2 PO) Take  1.25 mg by mouth every 14 (fourteen) days.    Marland Kitchen LORazepam (ATIVAN) 0.5 MG tablet Take 0.5 mg by mouth every 8 (eight) hours.    . Multiple Vitamin (MULTIVITAMIN) tablet Take 1 tablet by mouth daily.    . Multiple Vitamins-Minerals (OCUVITE PO) Take 1 tablet by mouth daily.    . polycarbophil (FIBERCON) 625 MG tablet Take 1 tablet by mouth daily.    . sertraline (ZOLOFT) 50 MG tablet Take 50 mg by mouth daily.     No current facility-administered medications for this visit.     PHYSICAL EXAMINATION: ECOG PERFORMANCE STATUS: 2 - Symptomatic, <50% confined to bed  Vitals:   03/02/17 1332  BP: 126/65  Pulse: 78  Resp: 20  Temp: 97.6 F (36.4 C)  SpO2: 100%   Filed Weights   03/02/17 1332  Weight: 200 lb (90.7 kg)    GENERAL:alert, no distress and comfortable.  Limited examination due to wheelchair-bound situation.  The patient had polio SKIN: skin color, texture, turgor are normal, no rashes or significant lesions EYES: normal, Conjunctiva are pink and non-injected, sclera clear OROPHARYNX:no exudate, no erythema and lips, buccal mucosa, and tongue normal  NECK: supple, thyroid normal size, non-tender, without nodularity LYMPH:  no palpable lymphadenopathy in the cervical, axillary or inguinal LUNGS: clear to auscultation and percussion with normal breathing effort HEART: regular rate & rhythm and no murmurs and no lower extremity edema ABDOMEN:abdomen soft, non-tender and normal bowel sounds Musculoskeletal:no cyanosis of digits and no clubbing  NEURO: alert & oriented x 3 with fluent speech,   LABORATORY DATA:  I have reviewed the data as listed    Component Value Date/Time  NA 142 01/29/2015 1316   K 4.6 01/29/2015 1316   CO2 31 (H) 01/29/2015 1316   GLUCOSE 92 01/29/2015 1316   BUN 16.6 01/29/2015 1316   CREATININE 0.7 01/29/2015 1316   CALCIUM 9.4 01/29/2015 1316   PROT 6.2 (L) 01/29/2015 1316   ALBUMIN 3.8 01/29/2015 1316   AST 19 01/29/2015 1316   ALT 15  01/29/2015 1316   ALKPHOS 90 01/29/2015 1316   BILITOT 0.50 01/29/2015 1316    No results found for: SPEP, UPEP  Lab Results  Component Value Date   WBC 11.6 (H) 01/29/2015   NEUTROABS 3.2 01/29/2015   HGB 15.3 01/29/2015   HCT 46.3 01/29/2015   MCV 96.8 01/29/2015   PLT 207 01/29/2015      Chemistry      Component Value Date/Time   NA 142 01/29/2015 1316   K 4.6 01/29/2015 1316   CO2 31 (H) 01/29/2015 1316   BUN 16.6 01/29/2015 1316   CREATININE 0.7 01/29/2015 1316      Component Value Date/Time   CALCIUM 9.4 01/29/2015 1316   ALKPHOS 90 01/29/2015 1316   AST 19 01/29/2015 1316   ALT 15 01/29/2015 1316   BILITOT 0.50 01/29/2015 1316      ASSESSMENT & PLAN:  CLL (chronic lymphocytic leukemia) Clinically, he is at stage 0. His blood works are stable with no signs of disease progression, anemia or thrombocytopenia. I reinforced the importance of preventive care due to the increased risk of infection and secondary malignancy with CLL patients. He had received influenza vaccination from PCP I will continue to see him every year with history and physical examination. He will continue blood work monitoring once a year through his primary care doctor's office.      No orders of the defined types were placed in this encounter.  All questions were answered. The patient knows to call the clinic with any problems, questions or concerns. No barriers to learning was detected. I spent 10 minutes counseling the patient face to face. The total time spent in the appointment was 15 minutes and more than 50% was on counseling and review of test results     Heath Lark, MD 03/02/2017 4:51 PM

## 2017-04-12 ENCOUNTER — Encounter (INDEPENDENT_AMBULATORY_CARE_PROVIDER_SITE_OTHER): Payer: Self-pay

## 2017-04-12 ENCOUNTER — Encounter: Payer: Self-pay | Admitting: Gastroenterology

## 2017-04-12 ENCOUNTER — Ambulatory Visit (INDEPENDENT_AMBULATORY_CARE_PROVIDER_SITE_OTHER): Payer: Medicare Other | Admitting: Gastroenterology

## 2017-04-12 VITALS — BP 124/74 | HR 64 | Ht 71.0 in

## 2017-04-12 DIAGNOSIS — Z800 Family history of malignant neoplasm of digestive organs: Secondary | ICD-10-CM

## 2017-04-12 DIAGNOSIS — Z86010 Personal history of colonic polyps: Secondary | ICD-10-CM

## 2017-04-12 NOTE — Progress Notes (Signed)
[facility]    [id number]    1933/01/25  Primary Care Physician:Perini, Elta Guadeloupe, MD  Referring Physician: Crist Infante, [place], [place] [address]  Chief complaint:  History of colon adenomatous polyps HPI: 83 year old male with history of chronic lymphocytic leukemia, post polio syndrome, here to discuss colorectal cancer screening.  Sister had colon cancer at age 10.  He has had multiple flexible sigmoidoscopy and 3 colonoscopies as detailed below. Colonoscopy: 03/23/2012 : 1 sessile polyp, Tubular adenoma and sigmoid diverticulosis Colonoscopy March 23, 2007: 4 small sessile polyps, tubular adenomas.  Sigmoid diverticulosis Colonoscopy November 2005: 5 mm tubulovillous adenoma  Denies any nausea, vomiting, abdominal pain, melena or bright red blood per rectum   Outpatient Encounter Medications as of 04/12/2017  Medication Sig  . buPROPion (WELLBUTRIN SR) 150 MG 12 hr tablet Take 1 tablet by mouth Daily.  . Calcium Carbonate-Vitamin D (CALTRATE 600+D PO) Take 1 tablet by mouth daily.  Mariane Baumgarten Calcium (STOOL SOFTENER PO) Take 1 capsule by mouth daily.  . Ergocalciferol (VITAMIN D2 PO) Take 1.25 mg by mouth every 14 (fourteen) days.  Marland Kitchen LORazepam (ATIVAN) 0.5 MG tablet Take 0.5 mg by mouth every 8 (eight) hours.  . Multiple Vitamin (MULTIVITAMIN) tablet Take 1 tablet by mouth daily.  . Multiple Vitamins-Minerals (OCUVITE PO) Take 1 tablet by mouth daily.  . polycarbophil (FIBERCON) 625 MG tablet Take 1 tablet by mouth daily.  . sertraline (ZOLOFT) 50 MG tablet Take 50 mg by mouth daily.   No facility-administered encounter medications on file as of 04/12/2017.     Allergies as of 04/12/2017 - Review Complete 03/02/2017  Allergen Reaction Noted  . Penicillins Rash 10/30/2011    Past Medical History:  Diagnosis Date  . Anxiety   . CLL (chronic lymphocytic leukemia) ([place]) 02/20/2014  . Osteoporosis   . Polio     Past Surgical  History:  Procedure Laterality Date  . CATARACT EXTRACTION  09/2011   at [facility]  . COLONOSCOPY  03/2007  . RETINAL DETACHMENT SURGERY  09/1995  . SPINAL FUSION  10/1952    Family History  Problem Relation Age of Onset  . Colon cancer Sister 53    Social History   Socioeconomic History  . Marital status: Married    Spouse name: Not on file  . Number of children: Not on file  . Years of education: Not on file  . Highest education level: Not on file  Social Needs  . Financial resource strain: Not on file  . Food insecurity - worry: Not on file  . Food insecurity - inability: Not on file  . Transportation needs - medical: Not on file  . Transportation needs - non-medical: Not on file  Occupational History  . Not on file  Tobacco Use  . Smoking status: Never Smoker  . Smokeless tobacco: Never Used  Substance and Sexual Activity  . Alcohol use: Yes    Comment: occasional beer or wine manybe monthly  . Drug use: No  . Sexual activity: Not on file  Other Topics Concern  . Not on file  Social History Narrative  . Not on file      Review of systems: Review of Systems  Constitutional: Negative for fever and chills.  HENT: Negative.   Eyes: Negative for blurred vision.  Respiratory: Negative for cough, shortness of breath and wheezing.   Cardiovascular: Negative for chest pain and palpitations.  Gastrointestinal: as  per HPI Genitourinary: Negative for dysuria, urgency, frequency and hematuria.  Musculoskeletal: Positive for myalgias, back pain and joint pain.  Skin: Negative for itching and rash.  Neurological: Negative for dizziness, tremors, focal weakness, seizures and loss of consciousness.  Endo/Heme/Allergies: Positive for seasonal allergies.  Psychiatric/Behavioral: Negative for depression, suicidal ideas and hallucinations.  All other systems reviewed and are negative.   Physical Exam: Vitals:   04/12/17 1330  BP: 124/74  Pulse: 64  SpO2: 98%   Body  mass index is 27.89 kg/m. Gen:      No acute distress, wheel chair dependent HEENT:  EOMI, sclera anicteric Neck:     No masses; no thyromegaly Lungs:    Clear to auscultation bilaterally; normal respiratory effort CV:         Regular rate and rhythm; no murmurs Abd:      + bowel sounds; soft, non-tender; no palpable masses, no distension Ext:    +edema; adequate peripheral perfusion, R leg brace Skin:      Warm and dry; no rash Neuro: alert and oriented x 3 Psych: normal mood and affect  Data Reviewed:  Reviewed labs, radiology imaging, old records and pertinent past GI work up   Assessment and Plan/Recommendations:  20 yr M with h/o post polio syndrome, muscle weakness, wheel chair dependent, scoliosis, chronic lymphocytic leukemia here to discuss colonoscopy His sister had colon cancer in her 50's s/p resection, currently is 21 alive He had small sessile adenomatous polyps <42mm removed during his prior colonoscopies, most recent in November 2013.  Discussed in detail potential benefits and risks associated with anaesthesia and colonoscopy. Also current guidelines do not recommend continuing colorectal cancer screening or surveillance after age 62. Opted to hold of colonoscopy for colorectal cancer screening, patient and his wife were in agreement Return as needed   K. Denzil Magnuson , MD [phone] Mon-Fri 8a-5p [phone] after 5p, weekends, holidays  CC: Crist Infante, MD

## 2017-04-12 NOTE — Patient Instructions (Signed)
Follow up as needed

## 2017-07-07 DIAGNOSIS — H1030 Unspecified acute conjunctivitis, unspecified eye: Secondary | ICD-10-CM | POA: Diagnosis not present

## 2017-08-19 DIAGNOSIS — H35373 Puckering of macula, bilateral: Secondary | ICD-10-CM | POA: Diagnosis not present

## 2017-08-19 DIAGNOSIS — Z961 Presence of intraocular lens: Secondary | ICD-10-CM | POA: Diagnosis not present

## 2017-08-19 DIAGNOSIS — H35353 Cystoid macular degeneration, bilateral: Secondary | ICD-10-CM | POA: Diagnosis not present

## 2018-01-14 DIAGNOSIS — Z23 Encounter for immunization: Secondary | ICD-10-CM | POA: Diagnosis not present

## 2018-01-20 DIAGNOSIS — Z961 Presence of intraocular lens: Secondary | ICD-10-CM | POA: Diagnosis not present

## 2018-01-20 DIAGNOSIS — H35353 Cystoid macular degeneration, bilateral: Secondary | ICD-10-CM | POA: Diagnosis not present

## 2018-01-20 DIAGNOSIS — H35373 Puckering of macula, bilateral: Secondary | ICD-10-CM | POA: Diagnosis not present

## 2018-02-16 ENCOUNTER — Emergency Department (HOSPITAL_COMMUNITY)
Admission: EM | Admit: 2018-02-16 | Discharge: 2018-02-16 | Disposition: A | Discharge status: Home / Self Care | Payer: Medicare Other | Attending: Emergency Medicine | Admitting: Emergency Medicine

## 2018-02-16 ENCOUNTER — Emergency Department (HOSPITAL_COMMUNITY): Payer: Medicare Other

## 2018-02-16 ENCOUNTER — Encounter (HOSPITAL_COMMUNITY): Payer: Self-pay | Admitting: Emergency Medicine

## 2018-02-16 DIAGNOSIS — I959 Hypotension, unspecified: Secondary | ICD-10-CM | POA: Diagnosis not present

## 2018-02-16 DIAGNOSIS — S0990XA Unspecified injury of head, initial encounter: Secondary | ICD-10-CM | POA: Insufficient documentation

## 2018-02-16 DIAGNOSIS — W1811XA Fall from or off toilet without subsequent striking against object, initial encounter: Secondary | ICD-10-CM | POA: Diagnosis not present

## 2018-02-16 DIAGNOSIS — Z79899 Other long term (current) drug therapy: Secondary | ICD-10-CM | POA: Insufficient documentation

## 2018-02-16 DIAGNOSIS — W19XXXA Unspecified fall, initial encounter: Secondary | ICD-10-CM

## 2018-02-16 DIAGNOSIS — Y92002 Bathroom of unspecified non-institutional (private) residence single-family (private) house as the place of occurrence of the external cause: Secondary | ICD-10-CM | POA: Insufficient documentation

## 2018-02-16 DIAGNOSIS — Y999 Unspecified external cause status: Secondary | ICD-10-CM | POA: Diagnosis not present

## 2018-02-16 DIAGNOSIS — Y9389 Activity, other specified: Secondary | ICD-10-CM | POA: Diagnosis not present

## 2018-02-16 NOTE — ED Notes (Signed)
Patient's wife is going home to get special powered wheelchair for transport to the car. She stated he cannot use a regular wheelchair to transfer.

## 2018-02-16 NOTE — ED Provider Notes (Signed)
[place] DEPT Provider Note   CSN: [id number] Arrival date & time: 02/16/18  1320     History   Chief Complaint Chief Complaint  Patient presents with  . Fall    HPI Manuel Reeves is a 84 y.o. male.  84 year old male with prior medical history as detailed below presents for evaluation following a fall.  Patient with long-standing history of polio.  Patient reports that he lost his balance and fell off the toilet earlier today.  He resides at [facility]  The top of his scalp struck a wicker basket near the toilet.  He denies other injury.  He denies neck pain or loss of conscious.  He contacted his physician who suggested that he come to the ED for evaluation and CT imaging. He denies bleeding from the injury.   The history is provided by the patient and medical records.  Head Injury   The incident occurred 1 to 2 hours ago. He came to the ER via EMS. The injury mechanism was a direct blow and a fall. There was no loss of consciousness. There was no blood loss. Quality: no pain reported. The patient is experiencing no pain. Pertinent negatives include no numbness, no blurred vision, no vomiting, no tinnitus, no disorientation, no weakness and no memory loss. He was found conscious by EMS personnel. He has tried nothing for the symptoms.    Past Medical History:  Diagnosis Date  . Anxiety   . CLL (chronic lymphocytic leukemia) ([employer]) 02/20/2014  . Osteoporosis   . Polio     Patient Active Problem List   Diagnosis Date Noted  . CLL (chronic lymphocytic leukemia) ([place]) 02/20/2014  . Polio     Past Surgical History:  Procedure Laterality Date  . CATARACT EXTRACTION  09/2011   at [facility]  . COLONOSCOPY  03/2007  . RETINAL DETACHMENT SURGERY  09/1995  . SPINAL FUSION  10/1952        Home Medications    Prior to Admission medications   Medication Sig Start Date End Date Taking? Authorizing Provider  buPROPion  (WELLBUTRIN SR) 150 MG 12 hr tablet Take 1 tablet by mouth Daily. 02/24/12   [provider]  Calcium Carbonate-Vitamin D (CALTRATE 600+D PO) Take 1 tablet by mouth daily.    [provider]  Docusate Calcium (STOOL SOFTENER PO) Take 1 capsule by mouth daily.    [provider]  Ergocalciferol (VITAMIN D2 PO) Take 1.25 mg by mouth every 14 (fourteen) days.    [provider]  LORazepam (ATIVAN) 0.5 MG tablet Take 0.5 mg by mouth every 8 (eight) hours.    [provider]  Multiple Vitamin (MULTIVITAMIN) tablet Take 1 tablet by mouth daily.    [provider]  Multiple Vitamins-Minerals (OCUVITE PO) Take 1 tablet by mouth daily.    [provider]  polycarbophil (FIBERCON) 625 MG tablet Take 1 tablet by mouth daily.    [provider]  sertraline (ZOLOFT) 50 MG tablet Take 50 mg by mouth daily.    [provider]    Family History Family History  Problem Relation Age of Onset  . Colon cancer Sister 68    Social History Social History   Tobacco Use  . Smoking status: Never Smoker  . Smokeless tobacco: Never Used  Substance Use Topics  . Alcohol use: Yes    Comment: occasional beer or wine manybe monthly  . Drug use: No  Allergies   Penicillins   Review of Systems Review of Systems  HENT: Negative for tinnitus.   Eyes: Negative for blurred vision.  Gastrointestinal: Negative for vomiting.  Neurological: Negative for weakness and numbness.  Psychiatric/Behavioral: Negative for memory loss.  All other systems reviewed and are negative.    Physical Exam Updated Vital Signs BP 128/66 (BP Location: Left Arm)   Pulse 96   Temp 98.6 F (37 C) (Oral)   Resp 18   SpO2 92%   Physical Exam  Constitutional: He is oriented to person, place, and time. He appears well-developed and well-nourished. No distress.  HENT:  Head: Normocephalic and atraumatic.  Mouth/Throat: Oropharynx is clear and  moist.  Small Echymosis to anterior scalp   No abrasion  No bleeding   Eyes: Pupils are equal, round, and reactive to light. Conjunctivae and EOM are normal.  Neck: Normal range of motion. Neck supple.  Cardiovascular: Normal rate, regular rhythm and normal heart sounds.  Pulmonary/Chest: Effort normal and breath sounds normal. No respiratory distress.  Abdominal: Soft. He exhibits no distension. There is no tenderness.  Musculoskeletal: Normal range of motion. He exhibits no edema or deformity.  Neurological: He is alert and oriented to person, place, and time.  GCS 15  Skin: Skin is warm and dry.  Psychiatric: He has a normal mood and affect.  Nursing note and vitals reviewed.    ED Treatments / Results  Labs (all labs ordered are listed, but only abnormal results are displayed) Labs Reviewed - No data to display  EKG None  Radiology Ct Head Wo Contrast  Result Date: 02/16/2018 CLINICAL DATA:  Fall from toilet with head injury. EXAM: CT HEAD WITHOUT CONTRAST TECHNIQUE: Contiguous axial images were obtained from the base of the skull through the vertex without intravenous contrast. COMPARISON:  None. FINDINGS: Brain: No evidence of parenchymal hemorrhage or extra-axial fluid collection. No mass lesion, mass effect, or midline shift. No CT evidence of acute infarction. Nonspecific moderate subcortical and periventricular white matter hypodensity, most in keeping with chronic small vessel ischemic change. Cerebral volume is age appropriate. No ventriculomegaly. Vascular: No acute abnormality. Skull: No evidence of calvarial fracture. Sinuses/Orbits: The visualized paranasal sinuses are essentially clear. Other:  The mastoid air cells are unopacified. IMPRESSION: 1. No evidence of acute intracranial abnormality. No evidence of calvarial fracture. 2. Moderate chronic small vessel ischemic changes in the cerebral white matter. Electronically Signed   By: Ilona Sorrel M.D.   On: 02/16/2018  15:33    Procedures Procedures (including critical care time)  Medications Ordered in ED Medications - No data to display   Initial Impression / Assessment and Plan / ED Course  I have reviewed the triage vital signs and the nursing notes.  Pertinent labs & imaging results that were available during my care of the patient were reviewed by me and considered in my medical decision making (see chart for details).     MDM  Screen complete  Patient is presenting for evaluation of reported head injury.  Patient without evidence on exam of significant intracranial injury.  CT imaging not reveal significant cranial injury.  Patient otherwise is without complaint.  Patient desires discharge home.  Strict return precautions given and understood.  Importance of close follow-up was stressed.  Of note, the patient's wife presented near time of discharge and requested that additional laboratory tests to be done just "to make sure that everything is okay."  Additional laboratory testing was offered to the patient but  he declines same.  He desires discharge.  He reports that he is "ancy to leave."    Final Clinical Impressions(s) / ED Diagnoses   Final diagnoses:  Fall, initial encounter  Injury of head, initial encounter    ED Discharge Orders    None       Valarie Merino, MD 02/16/18 1547

## 2018-02-16 NOTE — Discharge Instructions (Signed)
Please return for any problem. Follow up with your regular physician in 2-3 days.  °

## 2018-02-16 NOTE — ED Triage Notes (Signed)
-  Brought in via [place] from [place] -Sitting on toilet and slipped off, hit head on wicker basket, small abrasion on R forehead area, no LOC, no neck pain or dizziness -physician recommended hospital  -post-polio syndrome, wheelchair bound, decent right and left arm, cannot get left above shoulder, can move left leg, issues moving right leg  -Alert and oriented x 4  -Vitals 110/54 HR 90 RR 16

## 2018-02-17 DIAGNOSIS — R82998 Other abnormal findings in urine: Secondary | ICD-10-CM | POA: Diagnosis not present

## 2018-02-17 DIAGNOSIS — C9100 Acute lymphoblastic leukemia not having achieved remission: Secondary | ICD-10-CM | POA: Diagnosis not present

## 2018-02-17 DIAGNOSIS — R2689 Other abnormalities of gait and mobility: Secondary | ICD-10-CM | POA: Diagnosis not present

## 2018-02-17 DIAGNOSIS — M6281 Muscle weakness (generalized): Secondary | ICD-10-CM | POA: Diagnosis not present

## 2018-02-17 DIAGNOSIS — G14 Postpolio syndrome: Secondary | ICD-10-CM | POA: Diagnosis not present

## 2018-02-17 DIAGNOSIS — L89899 Pressure ulcer of other site, unspecified stage: Secondary | ICD-10-CM | POA: Diagnosis not present

## 2018-02-17 DIAGNOSIS — Z125 Encounter for screening for malignant neoplasm of prostate: Secondary | ICD-10-CM | POA: Diagnosis not present

## 2018-02-17 DIAGNOSIS — R05 Cough: Secondary | ICD-10-CM | POA: Diagnosis not present

## 2018-02-17 DIAGNOSIS — F3289 Other specified depressive episodes: Secondary | ICD-10-CM | POA: Diagnosis not present

## 2018-02-17 DIAGNOSIS — M810 Age-related osteoporosis without current pathological fracture: Secondary | ICD-10-CM | POA: Diagnosis not present

## 2018-02-17 DIAGNOSIS — Z79899 Other long term (current) drug therapy: Secondary | ICD-10-CM | POA: Diagnosis not present

## 2018-02-19 DIAGNOSIS — K5730 Diverticulosis of large intestine without perforation or abscess without bleeding: Secondary | ICD-10-CM | POA: Diagnosis not present

## 2018-02-19 DIAGNOSIS — M6281 Muscle weakness (generalized): Secondary | ICD-10-CM | POA: Diagnosis not present

## 2018-02-19 DIAGNOSIS — N401 Enlarged prostate with lower urinary tract symptoms: Secondary | ICD-10-CM | POA: Diagnosis not present

## 2018-02-19 DIAGNOSIS — R2689 Other abnormalities of gait and mobility: Secondary | ICD-10-CM | POA: Diagnosis not present

## 2018-02-19 DIAGNOSIS — G14 Postpolio syndrome: Secondary | ICD-10-CM | POA: Diagnosis not present

## 2018-02-19 DIAGNOSIS — L89132 Pressure ulcer of right lower back, stage 2: Secondary | ICD-10-CM | POA: Diagnosis not present

## 2018-02-19 DIAGNOSIS — J189 Pneumonia, unspecified organism: Secondary | ICD-10-CM | POA: Diagnosis not present

## 2018-02-19 DIAGNOSIS — C9100 Acute lymphoblastic leukemia not having achieved remission: Secondary | ICD-10-CM | POA: Diagnosis not present

## 2018-02-19 DIAGNOSIS — H1032 Unspecified acute conjunctivitis, left eye: Secondary | ICD-10-CM | POA: Diagnosis not present

## 2018-02-19 DIAGNOSIS — M419 Scoliosis, unspecified: Secondary | ICD-10-CM | POA: Diagnosis not present

## 2018-02-19 DIAGNOSIS — M1990 Unspecified osteoarthritis, unspecified site: Secondary | ICD-10-CM | POA: Diagnosis not present

## 2018-02-19 DIAGNOSIS — H1031 Unspecified acute conjunctivitis, right eye: Secondary | ICD-10-CM | POA: Diagnosis not present

## 2018-02-21 DIAGNOSIS — R2689 Other abnormalities of gait and mobility: Secondary | ICD-10-CM | POA: Diagnosis not present

## 2018-02-21 DIAGNOSIS — L89132 Pressure ulcer of right lower back, stage 2: Secondary | ICD-10-CM | POA: Diagnosis not present

## 2018-02-21 DIAGNOSIS — G14 Postpolio syndrome: Secondary | ICD-10-CM | POA: Diagnosis not present

## 2018-02-21 DIAGNOSIS — M6281 Muscle weakness (generalized): Secondary | ICD-10-CM | POA: Diagnosis not present

## 2018-02-21 DIAGNOSIS — C9100 Acute lymphoblastic leukemia not having achieved remission: Secondary | ICD-10-CM | POA: Diagnosis not present

## 2018-02-21 DIAGNOSIS — K5730 Diverticulosis of large intestine without perforation or abscess without bleeding: Secondary | ICD-10-CM | POA: Diagnosis not present

## 2018-02-23 DIAGNOSIS — R2689 Other abnormalities of gait and mobility: Secondary | ICD-10-CM | POA: Diagnosis not present

## 2018-02-23 DIAGNOSIS — C9100 Acute lymphoblastic leukemia not having achieved remission: Secondary | ICD-10-CM | POA: Diagnosis not present

## 2018-02-23 DIAGNOSIS — M6281 Muscle weakness (generalized): Secondary | ICD-10-CM | POA: Diagnosis not present

## 2018-02-23 DIAGNOSIS — G14 Postpolio syndrome: Secondary | ICD-10-CM | POA: Diagnosis not present

## 2018-02-23 DIAGNOSIS — K5730 Diverticulosis of large intestine without perforation or abscess without bleeding: Secondary | ICD-10-CM | POA: Diagnosis not present

## 2018-02-23 DIAGNOSIS — L89132 Pressure ulcer of right lower back, stage 2: Secondary | ICD-10-CM | POA: Diagnosis not present

## 2018-02-24 ENCOUNTER — Other Ambulatory Visit: Payer: Self-pay

## 2018-02-24 ENCOUNTER — Inpatient Hospital Stay (HOSPITAL_COMMUNITY)
Admission: EM | Admit: 2018-02-24 | Discharge: 2018-02-27 | DRG: 683 | Disposition: A | Discharge status: Skilled Nursing Facility | Payer: Medicare Other | Attending: Internal Medicine | Admitting: Internal Medicine

## 2018-02-24 ENCOUNTER — Emergency Department (HOSPITAL_COMMUNITY): Payer: Medicare Other

## 2018-02-24 ENCOUNTER — Encounter (HOSPITAL_COMMUNITY): Payer: Self-pay | Admitting: Emergency Medicine

## 2018-02-24 DIAGNOSIS — G14 Postpolio syndrome: Secondary | ICD-10-CM

## 2018-02-24 DIAGNOSIS — C9110 Chronic lymphocytic leukemia of B-cell type not having achieved remission: Secondary | ICD-10-CM | POA: Diagnosis not present

## 2018-02-24 DIAGNOSIS — N138 Other obstructive and reflux uropathy: Secondary | ICD-10-CM | POA: Diagnosis present

## 2018-02-24 DIAGNOSIS — R410 Disorientation, unspecified: Secondary | ICD-10-CM | POA: Diagnosis not present

## 2018-02-24 DIAGNOSIS — Z7401 Bed confinement status: Secondary | ICD-10-CM | POA: Diagnosis not present

## 2018-02-24 DIAGNOSIS — K5900 Constipation, unspecified: Secondary | ICD-10-CM | POA: Diagnosis present

## 2018-02-24 DIAGNOSIS — N179 Acute kidney failure, unspecified: Secondary | ICD-10-CM | POA: Diagnosis present

## 2018-02-24 DIAGNOSIS — N139 Obstructive and reflux uropathy, unspecified: Secondary | ICD-10-CM | POA: Diagnosis present

## 2018-02-24 DIAGNOSIS — N401 Enlarged prostate with lower urinary tract symptoms: Secondary | ICD-10-CM | POA: Diagnosis present

## 2018-02-24 DIAGNOSIS — Z466 Encounter for fitting and adjustment of urinary device: Secondary | ICD-10-CM | POA: Diagnosis not present

## 2018-02-24 DIAGNOSIS — D72829 Elevated white blood cell count, unspecified: Secondary | ICD-10-CM | POA: Diagnosis not present

## 2018-02-24 DIAGNOSIS — S3992XA Unspecified injury of lower back, initial encounter: Secondary | ICD-10-CM | POA: Diagnosis not present

## 2018-02-24 DIAGNOSIS — E875 Hyperkalemia: Secondary | ICD-10-CM | POA: Diagnosis present

## 2018-02-24 DIAGNOSIS — M6281 Muscle weakness (generalized): Secondary | ICD-10-CM | POA: Diagnosis not present

## 2018-02-24 DIAGNOSIS — M545 Low back pain: Secondary | ICD-10-CM | POA: Diagnosis not present

## 2018-02-24 DIAGNOSIS — F329 Major depressive disorder, single episode, unspecified: Secondary | ICD-10-CM | POA: Diagnosis present

## 2018-02-24 DIAGNOSIS — R0902 Hypoxemia: Secondary | ICD-10-CM | POA: Diagnosis not present

## 2018-02-24 DIAGNOSIS — F419 Anxiety disorder, unspecified: Secondary | ICD-10-CM | POA: Diagnosis not present

## 2018-02-24 DIAGNOSIS — M810 Age-related osteoporosis without current pathological fracture: Secondary | ICD-10-CM | POA: Diagnosis present

## 2018-02-24 DIAGNOSIS — Z981 Arthrodesis status: Secondary | ICD-10-CM

## 2018-02-24 DIAGNOSIS — M2550 Pain in unspecified joint: Secondary | ICD-10-CM | POA: Diagnosis not present

## 2018-02-24 DIAGNOSIS — Z79899 Other long term (current) drug therapy: Secondary | ICD-10-CM | POA: Diagnosis not present

## 2018-02-24 DIAGNOSIS — R5382 Chronic fatigue, unspecified: Secondary | ICD-10-CM | POA: Diagnosis not present

## 2018-02-24 LAB — URINALYSIS, ROUTINE W REFLEX MICROSCOPIC
Bacteria, UA: NONE SEEN
Bilirubin Urine: NEGATIVE
Glucose, UA: NEGATIVE mg/dL
Ketones, ur: NEGATIVE mg/dL
Leukocytes, UA: NEGATIVE
Nitrite: NEGATIVE
PH: 6 (ref 5.0–8.0)
Protein, ur: NEGATIVE mg/dL
Specific Gravity, Urine: 1.013 (ref 1.005–1.030)

## 2018-02-24 LAB — CBC WITH DIFFERENTIAL/PLATELET
Abs Immature Granulocytes: 0.18 10*3/uL — ABNORMAL HIGH (ref 0.00–0.07)
Basophils Absolute: 0.1 10*3/uL (ref 0.0–0.1)
Basophils Relative: 0 %
EOS PCT: 0 %
Eosinophils Absolute: 0 10*3/uL (ref 0.0–0.5)
HEMATOCRIT: 44.1 % (ref 39.0–52.0)
Hemoglobin: 13.8 g/dL (ref 13.0–17.0)
Immature Granulocytes: 1 %
Lymphocytes Relative: 60 %
Lymphs Abs: 17.3 10*3/uL — ABNORMAL HIGH (ref 0.7–4.0)
MCH: 31 pg (ref 26.0–34.0)
MCHC: 31.3 g/dL (ref 30.0–36.0)
MCV: 99.1 fL (ref 80.0–100.0)
MONO ABS: 0.9 10*3/uL (ref 0.1–1.0)
Monocytes Relative: 3 %
Neutro Abs: 10.3 10*3/uL — ABNORMAL HIGH (ref 1.7–7.7)
Neutrophils Relative %: 36 %
Platelets: 398 10*3/uL (ref 150–400)
RBC: 4.45 MIL/uL (ref 4.22–5.81)
RDW: 13.2 % (ref 11.5–15.5)
WBC: 28.7 10*3/uL — ABNORMAL HIGH (ref 4.0–10.5)
nRBC: 0 % (ref 0.0–0.2)

## 2018-02-24 LAB — COMPREHENSIVE METABOLIC PANEL
ALK PHOS: 91 U/L (ref 38–126)
ALT: 15 U/L (ref 0–44)
ANION GAP: 10 (ref 5–15)
AST: 18 U/L (ref 15–41)
Albumin: 3.3 g/dL — ABNORMAL LOW (ref 3.5–5.0)
BILIRUBIN TOTAL: 0.6 mg/dL (ref 0.3–1.2)
BUN: 81 mg/dL — AB (ref 8–23)
CALCIUM: 9.2 mg/dL (ref 8.9–10.3)
CO2: 30 mmol/L (ref 22–32)
Chloride: 97 mmol/L — ABNORMAL LOW (ref 98–111)
Creatinine, Ser: 3.64 mg/dL — ABNORMAL HIGH (ref 0.61–1.24)
GFR calc Af Amer: 16 mL/min — ABNORMAL LOW (ref >60)
GFR calc non Af Amer: 14 mL/min — ABNORMAL LOW (ref >60)
Glucose, Bld: 121 mg/dL — ABNORMAL HIGH (ref 70–99)
Potassium: 6.6 mmol/L (ref 3.5–5.1)
Sodium: 137 mmol/L (ref 135–145)
TOTAL PROTEIN: 6.9 g/dL (ref 6.5–8.1)

## 2018-02-24 LAB — CK: Total CK: 46 U/L — ABNORMAL LOW (ref 49–397)

## 2018-02-24 LAB — URIC ACID: Uric Acid, Serum: 6.5 mg/dL (ref 3.7–8.6)

## 2018-02-24 LAB — MAGNESIUM: Magnesium: 2.6 mg/dL — ABNORMAL HIGH (ref 1.7–2.4)

## 2018-02-24 MED ORDER — INSULIN ASPART 100 UNIT/ML ~~LOC~~ SOLN
5.0000 [IU] | Freq: Once | SUBCUTANEOUS | Status: AC
  Administered 2018-02-24: 5 [IU] via INTRAVENOUS
  Filled 2018-02-24: qty 1

## 2018-02-24 MED ORDER — SODIUM CHLORIDE 0.9 % IV BOLUS
1000.0000 mL | Freq: Once | INTRAVENOUS | Status: AC
  Administered 2018-02-24: 1000 mL via INTRAVENOUS

## 2018-02-24 MED ORDER — SERTRALINE HCL 50 MG PO TABS
50.0000 mg | ORAL_TABLET | Freq: Every day | ORAL | Status: DC
  Administered 2018-02-24 – 2018-02-27 (×4): 50 mg via ORAL
  Filled 2018-02-24 (×4): qty 1

## 2018-02-24 MED ORDER — HEPARIN SODIUM (PORCINE) 5000 UNIT/ML IJ SOLN
5000.0000 [IU] | Freq: Three times a day (TID) | INTRAMUSCULAR | Status: DC
  Administered 2018-02-25 – 2018-02-26 (×6): 5000 [IU] via SUBCUTANEOUS
  Filled 2018-02-24 (×6): qty 1

## 2018-02-24 MED ORDER — POLYETHYLENE GLYCOL 3350 17 G PO PACK: 17.0000 g | PACK | Freq: Two times a day (BID) | ORAL | 0 refills | Status: DC

## 2018-02-24 MED ORDER — SODIUM CHLORIDE 0.9 % IV SOLN
INTRAVENOUS | Status: DC
  Administered 2018-02-24 – 2018-02-25 (×3): via INTRAVENOUS

## 2018-02-24 MED ORDER — LORAZEPAM 0.5 MG PO TABS
0.5000 mg | ORAL_TABLET | Freq: Three times a day (TID) | ORAL | Status: DC
  Administered 2018-02-24 (×2): 0.5 mg via ORAL
  Filled 2018-02-24 (×3): qty 1

## 2018-02-24 MED ORDER — ACETAMINOPHEN 325 MG PO TABS: 650.0000 mg | ORAL_TABLET | Freq: Four times a day (QID) | ORAL | Status: DC | PRN

## 2018-02-24 MED ORDER — ONDANSETRON HCL 4 MG PO TABS: 4.0000 mg | ORAL_TABLET | Freq: Four times a day (QID) | ORAL | Status: DC | PRN

## 2018-02-24 MED ORDER — ACETAMINOPHEN 650 MG RE SUPP: 650.0000 mg | Freq: Four times a day (QID) | RECTAL | Status: DC | PRN

## 2018-02-24 MED ORDER — ONDANSETRON HCL 4 MG/2ML IJ SOLN: 4.0000 mg | Freq: Four times a day (QID) | INTRAMUSCULAR | Status: DC | PRN

## 2018-02-24 MED ORDER — BUPROPION HCL ER (SR) 150 MG PO TB12
150.0000 mg | ORAL_TABLET | Freq: Once | ORAL | Status: AC
  Administered 2018-02-24: 150 mg via ORAL
  Filled 2018-02-24: qty 1

## 2018-02-24 MED ORDER — SENNA 8.6 MG PO TABS
1.0000 | ORAL_TABLET | Freq: Every day | ORAL | Status: DC
  Administered 2018-02-25 – 2018-02-27 (×3): 8.6 mg via ORAL
  Filled 2018-02-24 (×3): qty 1

## 2018-02-24 MED ORDER — CALCIUM GLUCONATE-NACL 1-0.675 GM/50ML-% IV SOLN
1.0000 g | Freq: Once | INTRAVENOUS | Status: AC
  Administered 2018-02-24: 1000 mg via INTRAVENOUS

## 2018-02-24 MED ORDER — SORBITOL 70 % SOLN
960.0000 mL | TOPICAL_OIL | Freq: Once | ORAL | Status: AC
  Administered 2018-02-24: 450 mL via RECTAL
  Filled 2018-02-24: qty 473

## 2018-02-24 MED ORDER — DEXTROSE 50 % IV SOLN
1.0000 | Freq: Once | INTRAVENOUS | Status: AC
  Administered 2018-02-24: 50 mL via INTRAVENOUS
  Filled 2018-02-24: qty 50

## 2018-02-24 MED ORDER — SODIUM CHLORIDE 0.9 % IV SOLN: 1.0000 g | Freq: Once | INTRAVENOUS | Status: DC

## 2018-02-24 MED ORDER — DOCUSATE SODIUM 100 MG PO CAPS
100.0000 mg | ORAL_CAPSULE | Freq: Two times a day (BID) | ORAL | Status: DC
  Administered 2018-02-25 – 2018-02-27 (×6): 100 mg via ORAL
  Filled 2018-02-24 (×6): qty 1

## 2018-02-24 NOTE — Discharge Instructions (Signed)
Use the MiraLAX, as directed, twice a day for 1 week or until you have daily soft stools.  After that use MiraLAX once a day until your constipation has resolved.  Make sure that you are drinking plenty of fluids and eat a high-fiber diet.

## 2018-02-24 NOTE — ED Notes (Signed)
Pt able to tolerate approximately 450 ml of enema solution.

## 2018-02-24 NOTE — ED Notes (Signed)
Bed: FH21 Expected date:  Expected time:  Means of arrival:  Comments: EMS constipation x5days

## 2018-02-24 NOTE — ED Notes (Signed)
ED TO INPATIENT HANDOFF REPORT  Name/Age/Gender Manuel Reeves 84 y.o. male  Code Status    Code Status Orders  (From admission, onward)         Start     Ordered   02/24/18 2223  Full code  Continuous     02/24/18 2224        Code Status History    This patient has a current code status but no historical code status.    Advance Directive Documentation     Most Recent Value  Type of Advance Directive  Living will  Pre-existing out of facility DNR order (yellow form or pink MOST form)  -  "MOST" Form in Place?  -      Home/SNF/Other Nursing Home  Chief Complaint constipation  Level of Care/Admitting Diagnosis ED Disposition    ED Disposition Condition [address]: [facility] [[id number]]  Level of Care: Telemetry [5]  Admit to tele based on following criteria: Other see comments  Comments: Hyperkalemia  Diagnosis: Acute kidney failure [facility]) [[id number]]  Admitting Physician: Doreatha Massed  Attending Physician: Etta Quill [phone]  Estimated length of stay: past midnight tomorrow  Certification:: I certify this patient will need inpatient services for at least 2 midnights  PT Class (Do Not Modify): Inpatient [101]  PT Acc Code (Do Not Modify): Private [1]       Medical History Past Medical History:  Diagnosis Date  . Anxiety   . CLL (chronic lymphocytic leukemia) ([place]) 02/20/2014  . Osteoporosis   . Polio     Allergies Allergies  Allergen Reactions  . Penicillins Rash    Has patient had a PCN reaction causing immediate rash, facial/tongue/throat swelling, SOB or lightheadedness with hypotension: Y Has patient had a PCN reaction causing severe rash involving mucus membranes or skin necrosis: N Has patient had a PCN reaction that required hospitalization: N Has patient had a PCN reaction occurring within the last 10 years: N If all of the above answers are "NO", then may proceed with Cephalosporin  use.       IV Location/Drains/Wounds Patient Lines/Drains/Airways Status   Active Line/Drains/Airways    Name:   Placement date:   Placement time:   Site:   Days:   Peripheral IV 02/24/18 Right Antecubital   02/24/18    1719    Antecubital   less than 1   Urethral Catheter Joesphine Schemm K RN Double-lumen 16 Fr.   02/24/18    2100    Double-lumen   less than 1          Labs/Imaging Results for orders placed or performed during the hospital encounter of 02/24/18 (from the past 48 hour(s))  Urinalysis, Routine w reflex microscopic     Status: Abnormal   Collection Time: 02/24/18  5:33 PM  Result Value Ref Range   Color, Urine YELLOW YELLOW   APPearance CLEAR CLEAR   Specific Gravity, Urine 1.013 1.005 - 1.030   pH 6.0 5.0 - 8.0   Glucose, UA NEGATIVE NEGATIVE mg/dL   Hgb urine dipstick SMALL (A) NEGATIVE   Bilirubin Urine NEGATIVE NEGATIVE   Ketones, ur NEGATIVE NEGATIVE mg/dL   Protein, ur NEGATIVE NEGATIVE mg/dL   Nitrite NEGATIVE NEGATIVE   Leukocytes, UA NEGATIVE NEGATIVE   RBC / HPF 11-20 0 - 5 RBC/hpf   WBC, UA 0-5 0 - 5 WBC/hpf   Bacteria, UA NONE SEEN NONE SEEN   Squamous Epithelial /  LPF 0-5 0 - 5   Mucus PRESENT     Comment: Performed at [facility], [place] [address] [place], [place] [address]  CBC with Differential     Status: Abnormal   Collection Time: 02/24/18  5:46 PM  Result Value Ref Range   WBC 28.7 (H) 4.0 - 10.5 K/uL   RBC 4.45 4.22 - 5.81 MIL/uL   Hemoglobin 13.8 13.0 - 17.0 g/dL   HCT 44.1 39.0 - 52.0 %   MCV 99.1 80.0 - 100.0 fL   MCH 31.0 26.0 - 34.0 pg   MCHC 31.3 30.0 - 36.0 g/dL   RDW 13.2 11.5 - 15.5 %   Platelets 398 150 - 400 K/uL   nRBC 0.0 0.0 - 0.2 %   Neutrophils Relative % 36 %   Neutro Abs 10.3 (H) 1.7 - 7.7 K/uL   Lymphocytes Relative 60 %   Lymphs Abs 17.3 (H) 0.7 - 4.0 K/uL   Monocytes Relative 3 %   Monocytes Absolute 0.9 0.1 - 1.0 K/uL   Eosinophils Relative 0 %   Eosinophils Absolute 0.0 0.0 - 0.5 K/uL    Basophils Relative 0 %   Basophils Absolute 0.1 0.0 - 0.1 K/uL   Immature Granulocytes 1 %   Abs Immature Granulocytes 0.18 (H) 0.00 - 0.07 K/uL   Abnormal Lymphocytes Present PRESENT    Smudge Cells PRESENT     Comment: Performed at [facility], [place] [address] [place], [place] [address]  Comprehensive metabolic panel     Status: Abnormal   Collection Time: 02/24/18  5:46 PM  Result Value Ref Range   Sodium 137 135 - 145 mmol/L   Potassium 6.6 (HH) 3.5 - 5.1 mmol/L    Comment: CRITICAL RESULT CALLED TO, READ BACK BY AND VERIFIED WITH: BINGHAM,S. RN '@1910'$  ON 10.17.19 BY COHEN,K    Chloride 97 (L) 98 - 111 mmol/L   CO2 30 22 - 32 mmol/L   Glucose, Bld 121 (H) 70 - 99 mg/dL   BUN 81 (H) 8 - 23 mg/dL   Creatinine, Ser 3.64 (H) 0.61 - 1.24 mg/dL   Calcium 9.2 8.9 - 10.3 mg/dL   Total Protein 6.9 6.5 - 8.1 g/dL   Albumin 3.3 (L) 3.5 - 5.0 g/dL   AST 18 15 - 41 U/L   ALT 15 0 - 44 U/L   Alkaline Phosphatase 91 38 - 126 U/L   Total Bilirubin 0.6 0.3 - 1.2 mg/dL   GFR calc non Af Amer 14 (L) >60 mL/min   GFR calc Af Amer 16 (L) >60 mL/min    Comment: (NOTE) The eGFR has been calculated using the CKD EPI equation. This calculation has not been validated in all clinical situations. eGFR's persistently <60 mL/min signify possible Chronic Kidney Disease.    Anion gap 10 5 - 15    Comment: Performed at [facility], [place] [address], [place], [place] [address]  Uric acid     Status: None   Collection Time: 02/24/18  5:46 PM  Result Value Ref Range   Uric Acid, Serum 6.5 3.7 - 8.6 mg/dL    Comment: Performed at [facility], [place] [address] [place], [place] [address]  CK     Status: Abnormal   Collection Time: 02/24/18  5:46 PM  Result Value Ref Range   Total CK 46 (L) 49 - 397 U/L    Comment: Performed at [facility], [place] [address]  [place], [place] [address]  Magnesium     Status: Abnormal   Collection  Time: 02/24/18  5:46 PM  Result Value Ref Range   Magnesium 2.6 (H) 1.7 - 2.4 mg/dL    Comment: Performed at [facility], [place] [address], [place], [place] [address]   Dg Lumbar Spine Complete  Result Date: 02/24/2018 CLINICAL DATA:  Low back pain after a fall. Initial encounter. EXAM: LUMBAR SPINE - COMPLETE 4+ VIEW COMPARISON:  None. FINDINGS: There appears to be transitional lumbosacral anatomy which balloon be considered a partially lumbarized S1 for the purposes of this report. Severe thoracic rotatory dextroscoliosis is partially visualized with compensatory moderate levoconvex lumbar curvature. Within limitations of obliquity created by the scoliosis, no fracture is identified. No listhesis is seen in the sagittal plane from L3-4 inferiorly with assessment of the upper lumbar spine limited by obliquity. Moderately bulky vertebral osteophyte formation is noted at L3-4. There is mild diffuse gaseous bowel distension throughout the visualized abdomen. IMPRESSION: Severe thoracolumbar scoliosis without acute osseous abnormality identified. Electronically Signed   By: Logan Bores M.D.   On: 02/24/2018 18:14   Dg Abdomen 1 View  Result Date: 02/24/2018 CLINICAL DATA:  Constipation, abdominal distention. EXAM: ABDOMEN - 1 VIEW COMPARISON:  None. FINDINGS: The bowel gas pattern is normal. Phleboliths are noted in the pelvis. Mild amount of stool seen throughout the colon. IMPRESSION: Mild stool burden is noted. No evidence of bowel obstruction or ileus. Electronically Signed   By: Marijo Conception, M.D.   On: 02/24/2018 11:38   EKG Interpretation  Date/Time:  Thursday February 24 2018 19:48:18 EDT Ventricular Rate:  96 PR Interval:    QRS Duration: 86 QT Interval:  317 QTC Calculation: 401 R Axis:   35 Text Interpretation:  Sinus rhythm Left atrial enlargement Borderline low voltage, extremity leads No previous ECGs available Confirmed by Gareth Morgan [phone]) on  02/24/2018 7:54:43 PM   Pending Labs Unresulted Labs (From admission, onward)    Start     Ordered   02/25/18 3419  Basic metabolic panel  Tomorrow morning,   R     02/24/18 2224   02/25/18 6222  Basic metabolic panel  Once-Timed,   R     02/24/18 2224   02/24/18 1651  Urine culture  STAT,   STAT     02/24/18 1650          Vitals/Pain Today's Vitals   02/24/18 1238 02/24/18 1523 02/24/18 1525 02/24/18 1943  BP: (!) 163/86 (!) 152/73  (!) 163/92  Pulse: 93 90  97  Resp: 16 16  (!) 24  Temp: 98 F (36.7 C)     TempSrc: Oral     SpO2: 96% 96%  92%  PainSc: 0-No pain 0-No pain 0-No pain     Isolation Precautions No active isolations  Medications Medications  LORazepam (ATIVAN) tablet 0.5 mg (0.5 mg Oral Given 02/24/18 1713)  sertraline (ZOLOFT) tablet 50 mg (50 mg Oral Given 02/24/18 1713)  0.9 %  sodium chloride infusion ( Intravenous New Bag/Given 02/24/18 2015)  acetaminophen (TYLENOL) tablet 650 mg (has no administration in time range)    Or  acetaminophen (TYLENOL) suppository 650 mg (has no administration in time range)  ondansetron (ZOFRAN) tablet 4 mg (has no administration in time range)    Or  ondansetron (ZOFRAN) injection 4 mg (has no administration in time range)  heparin injection 5,000 Units (has no administration in time range)  docusate sodium (COLACE) capsule 100  mg (has no administration in time range)  senna (SENOKOT) tablet 8.6 mg (has no administration in time range)  sorbitol, milk of mag, mineral oil, glycerin (SMOG) enema (450 mLs Rectal Given 02/24/18 1200)  buPROPion (WELLBUTRIN SR) 12 hr tablet 150 mg (150 mg Oral Given 02/24/18 1713)  sodium chloride 0.9 % bolus 1,000 mL (0 mLs Intravenous Stopped 02/24/18 1826)  sodium chloride 0.9 % bolus 1,000 mL (0 mLs Intravenous Stopped 02/24/18 2114)  insulin aspart (novoLOG) injection 5 Units (5 Units Intravenous Given 02/24/18 2010)  dextrose 50 % solution 50 mL (50 mLs Intravenous Given 02/24/18  2009)  calcium gluconate 1 g/ 50 mL sodium chloride IVPB (0 g Intravenous Stopped 02/24/18 2200)    Mobility power wheelchair

## 2018-02-24 NOTE — ED Notes (Signed)
Wife to bring w/c Lucianne Lei to ED for pt transport home.

## 2018-02-24 NOTE — Progress Notes (Addendum)
CSW following up.  CSW spoke with Education officer, museum at [facility]. Per [place], [facility] of nursing and admissions coordinator are not there today. They do have beds available for their SNF placement. Payment would $286 a day. Family agreeable to this.   CSW obtained PASSR Number and sent FL-2 to [place] spoke with [place]. Pt has personal care through [facility] over night. [facility] will be able to meet pt and pt's wife tomorrow to assess if pt would be a better fit for their SNF or Respite Care. Administrators for both SNF and Respite will be there tomorrow and able to assess pt then. CSW provided social worker at [facility] with pt's home phone number [phone]. Pt and pt's wife agreeable to this plan. When CSW spoke with pt, pt no longer agreeable to go to SNF. Pt's family would like pt to reconsider this. [facility] will assess pt for needs tomorrow.   CSW updated EDP.  Update: Pt is still undergoing medical workup. CSW will continue to follow if needs change.   Wendelyn Breslow, [facility]  [phone]

## 2018-02-24 NOTE — ED Notes (Signed)
Unable to get a reading from bladder scanner, after multiple tries.

## 2018-02-24 NOTE — ED Provider Notes (Addendum)
[place] DEPT Provider Note   CSN: [id number] Arrival date & time: 02/24/18  2863     History   Chief Complaint Chief Complaint  Patient presents with  . Constipation    HPI Manuel Reeves is a 84 y.o. male.  HPI   He complains of no bowel movements for 5 days despite using magnesium citrate.  He has had nausea without vomiting.  His appetite has been poor.  Patient is nonambulatory due to polio.  He lives in a independent living facility, with his wife.  He denies fever, chills, abdominal or back pain.  There are no other no modifying factors.  Past Medical History:  Diagnosis Date  . Anxiety   . CLL (chronic lymphocytic leukemia) ([place]) 02/20/2014  . Osteoporosis   . Polio     Patient Active Problem List   Diagnosis Date Noted  . CLL (chronic lymphocytic leukemia) ([place]) 02/20/2014  . Polio     Past Surgical History:  Procedure Laterality Date  . CATARACT EXTRACTION  09/2011   at [facility]  . COLONOSCOPY  03/2007  . RETINAL DETACHMENT SURGERY  09/1995  . SPINAL FUSION  10/1952        Home Medications    Prior to Admission medications   Medication Sig Start Date End Date Taking? Authorizing Provider  buPROPion (WELLBUTRIN SR) 150 MG 12 hr tablet Take 1 tablet by mouth Daily. 02/24/12  Yes [provider]  Calcium Carbonate-Vitamin D (CALTRATE 600+D PO) Take 1 tablet by mouth daily.   Yes [provider]  Docusate Calcium (STOOL SOFTENER PO) Take 1 capsule by mouth daily.   Yes [provider]  Ergocalciferol (VITAMIN D2 PO) Take 50,000 Units by mouth every 14 (fourteen) days.    Yes [provider]  LORazepam (ATIVAN) 0.5 MG tablet Take 0.5 mg by mouth every 8 (eight) hours.   Yes [provider]  Multiple Vitamin (MULTIVITAMIN) tablet Take 1 tablet by mouth daily.   Yes [provider]  Multiple Vitamins-Minerals (OCUVITE PO) Take 1 tablet by mouth daily.   Yes  [provider]  sertraline (ZOLOFT) 50 MG tablet Take 50 mg by mouth daily.   Yes [provider]  polyethylene glycol (MIRALAX / GLYCOLAX) packet Take 17 g by mouth 2 (two) times daily. 02/24/18   Daleen Bo, MD    Family History Family History  Problem Relation Age of Onset  . Colon cancer Sister 43    Social History Social History   Tobacco Use  . Smoking status: Never Smoker  . Smokeless tobacco: Never Used  Substance Use Topics  . Alcohol use: Yes    Comment: occasional beer or wine manybe monthly  . Drug use: No     Allergies   Penicillins   Review of Systems Review of Systems  All other systems reviewed and are negative.    Physical Exam Updated Vital Signs BP (!) 152/73 (BP Location: Left Arm)   Pulse 90   Temp 98 F (36.7 C) (Oral)   Resp 16   SpO2 96%   Physical Exam  Constitutional: He is oriented to person, place, and time. He appears well-developed and well-nourished.  HENT:  Head: Normocephalic and atraumatic.  Right Ear: External ear normal.  Left Ear: External ear normal.  Eyes: Pupils are equal, round, and reactive to light. Conjunctivae and EOM are normal.  Neck: Normal range of motion and phonation normal. Neck supple.  Cardiovascular: Normal rate, regular rhythm  and normal heart sounds.  Pulmonary/Chest: Effort normal and breath sounds normal. No respiratory distress. He exhibits no bony tenderness.  Abdominal: Soft. He exhibits no distension. There is no tenderness. There is no guarding.  Genitourinary:  Genitourinary Comments: Normal anus.  Small amount of brown rectal stool, without fecal impaction.  Musculoskeletal: Normal range of motion.  Neurological: He is alert and oriented to person, place, and time. No cranial nerve deficit or sensory deficit. He exhibits normal muscle tone. Coordination normal.  Skin: Skin is warm, dry and intact.  Psychiatric: He has a normal mood and affect. His behavior is normal.  Judgment and thought content normal.  Nursing note and vitals reviewed.    ED Treatments / Results  Labs (all labs ordered are listed, but only abnormal results are displayed) Labs Reviewed - No data to display  EKG None  Radiology Dg Abdomen 1 View  Result Date: 02/24/2018 CLINICAL DATA:  Constipation, abdominal distention. EXAM: ABDOMEN - 1 VIEW COMPARISON:  None. FINDINGS: The bowel gas pattern is normal. Phleboliths are noted in the pelvis. Mild amount of stool seen throughout the colon. IMPRESSION: Mild stool burden is noted. No evidence of bowel obstruction or ileus. Electronically Signed   By: Marijo Conception, M.D.   On: 02/24/2018 11:38    Procedures Procedures (including critical care time)  Medications Ordered in ED Medications  LORazepam (ATIVAN) tablet 0.5 mg (has no administration in time range)  sertraline (ZOLOFT) tablet 50 mg (has no administration in time range)  buPROPion (WELLBUTRIN SR) 12 hr tablet 150 mg (has no administration in time range)  sorbitol, milk of mag, mineral oil, glycerin (SMOG) enema (450 mLs Rectal Given 02/24/18 1200)     Initial Impression / Assessment and Plan / ED Course  I have reviewed the triage vital signs and the nursing notes.  Pertinent labs & imaging results that were available during my care of the patient were reviewed by me and considered in my medical decision making (see chart for details).  Clinical Course as of Feb 24 1618  Thu Feb 24, 2018  1619 I was called to room by family members were concerned about him being discharged, "like this."  This time the patient is alert, somewhat confused, and agitated.  He has not had any of his daily medications, I ordered Wellbutrin lorazepam and Zoloft.  Contacted Education officer, museum will see the patient and try to arrange for higher level care at his nursing facility.  Family members were appreciative and are in agreement with this plan.   [EW]    Clinical Course User Index [EW]  Daleen Bo, MD     Patient Vitals for the past 24 hrs:  BP Temp Temp src Pulse Resp SpO2  02/24/18 1523 (!) 152/73 - - 90 16 96 %  02/24/18 1238 (!) 163/86 98 F (36.7 C) Oral 93 16 96 %  02/24/18 0957 (!) 164/83 98 F (36.7 C) Oral 100 18 95 %    3:00 PM Reevaluation with update and discussion. After initial assessment and treatment, an updated evaluation reveals he has had a large and small bowel movement after the enema.  He feels comfortable.  He is tolerating some crackers and oral liquids.  Findings discussed with patient, and wife, all questions answered. Daleen Bo   Medical Decision Making: Evaluation consistent with constipation, without fecal impaction.  Patient improved after enema.  No hemodynamic instability.  Doubt bowel obstruction.  CRITICAL CARE-no Performed by: Daleen Bo    Final  Clinical Impressions(s) / ED Diagnoses   Final diagnoses:  Constipation, unspecified constipation type    Nursing Notes Reviewed/ Care Coordinated Applicable Imaging Reviewed Interpretation of Laboratory Data incorporated into ED treatment  The patient appears reasonably screened and/or stabilized for discharge and I doubt any other medical condition or other [facility] requiring further screening, evaluation, or treatment in the ED at this time prior to discharge.  Plan: Home Medications-continue home medication, use MiraLAX twice a day for 1 week and once a day, PRN constipation; Home Treatments-rest, fluids, high-fiber diet; return here if the recommended treatment, does not improve the symptoms; Recommended follow up-PCP, PRN   ED Discharge Orders         Ordered    polyethylene glycol (MIRALAX / GLYCOLAX) packet  2 times daily     02/24/18 1447           Daleen Bo, MD 02/24/18 1501    Daleen Bo, MD 02/24/18 1620

## 2018-02-24 NOTE — H&P (Signed)
History and Physical    Manuel Reeves:[id number] DOB: 10-28-1932 DOA: 02/24/2018  PCP: Crist Infante, MD  Patient coming from: ILF  I have personally briefly reviewed patient's old medical records in [place]  Chief Complaint: Constipation  HPI: Manuel Reeves is a 84 y.o. male with medical history significant of post-polio syndrome, CLL.  Patient presents to the ED with c/o no BM in past 5 days, nausea without vomiting.  Poor PO intake.  No fevers, chills, back pain.  Constipation not helped despite trying mag citrate at home.  Does have pelvic tenderness, and difficulty urinating.  Last night was getting up every hour to try and urinate but only produced a tablespoon full of urine.   ED Course: Had BM after enema.  Creat 3.6 (0.8 back in 2016), BUN 81.  K 6.6 with mild T wave peaking.  WBC 28.7k with 17k lymphs.  BM after enema in ED.  Given temp measures for hyperkalemia and hospitalist asked to admit.   Review of Systems: As per HPI otherwise 10 point review of systems negative.   Past Medical History:  Diagnosis Date  . Anxiety   . CLL (chronic lymphocytic leukemia) ([place]) 02/20/2014  . Osteoporosis   . Polio     Past Surgical History:  Procedure Laterality Date  . CATARACT EXTRACTION  09/2011   at [facility]  . COLONOSCOPY  03/2007  . RETINAL DETACHMENT SURGERY  09/1995  . SPINAL FUSION  10/1952     reports that he has never smoked. He has never used smokeless tobacco. He reports that he drinks alcohol. He reports that he does not use drugs.  Allergies  Allergen Reactions  . Penicillins Rash    Has patient had a PCN reaction causing immediate rash, facial/tongue/throat swelling, SOB or lightheadedness with hypotension: Y Has patient had a PCN reaction causing severe rash involving mucus membranes or skin necrosis: N Has patient had a PCN reaction that required hospitalization: N Has patient had a PCN reaction occurring within the last 10  years: N If all of the above answers are "NO", then may proceed with Cephalosporin use.       Family History  Problem Relation Age of Onset  . Colon cancer Sister 73     Prior to Admission medications   Medication Sig Start Date End Date Taking? Authorizing Provider  buPROPion (WELLBUTRIN SR) 150 MG 12 hr tablet Take 1 tablet by mouth Daily. 02/24/12  Yes [provider]  Calcium Carbonate-Vitamin D (CALTRATE 600+D PO) Take 1 tablet by mouth daily.   Yes [provider]  Docusate Calcium (STOOL SOFTENER PO) Take 1 capsule by mouth daily.   Yes [provider]  Ergocalciferol (VITAMIN D2 PO) Take 50,000 Units by mouth every 14 (fourteen) days.    Yes [provider]  LORazepam (ATIVAN) 0.5 MG tablet Take 0.5 mg by mouth every 8 (eight) hours.   Yes [provider]  Multiple Vitamin (MULTIVITAMIN) tablet Take 1 tablet by mouth daily.   Yes [provider]  Multiple Vitamins-Minerals (OCUVITE PO) Take 1 tablet by mouth daily.   Yes [provider]  sertraline (ZOLOFT) 50 MG tablet Take 50 mg by mouth daily.   Yes [provider]  polyethylene glycol (MIRALAX / GLYCOLAX) packet Take 17 g by mouth 2 (two) times daily. 02/24/18   Daleen Bo, MD    Physical Exam: Vitals:   02/24/18 0957 02/24/18 1238 02/24/18 1523 02/24/18 1943  BP: Marland Kitchen)  164/83 (!) 163/86 (!) 152/73 (!) 163/92  Pulse: 100 93 90 97  Resp: 18 16 16  (!) 24  Temp: 98 F (36.7 C) 98 F (36.7 C)    TempSrc: Oral Oral    SpO2: 95% 96% 96% 92%    Constitutional: NAD, calm, comfortable Eyes: PERRL, lids and conjunctivae normal ENMT: Mucous membranes are moist. Posterior pharynx clear of any exudate or lesions.Normal dentition.  Neck: normal, supple, no masses, no thyromegaly Respiratory: clear to auscultation bilaterally, no wheezing, no crackles. Normal respiratory effort. No accessory muscle use.  Cardiovascular: Regular rate and rhythm, no murmurs  / rubs / gallops. No extremity edema. 2+ pedal pulses. No carotid bruits.  Abdomen: no tenderness, no masses palpated. No hepatosplenomegaly. Bowel sounds positive.  Musculoskeletal: no clubbing / cyanosis. No joint deformity upper and lower extremities. Good ROM, no contractures. Normal muscle tone.  Skin: no rashes, lesions, ulcers. No induration Neurologic: CN 2-12 grossly intact. Sensation intact, DTR normal. Strength 5/5 in all 4.  Psychiatric: Normal judgment and insight. Alert and oriented x 3. Normal mood.    Labs on Admission: I have personally reviewed following labs and imaging studies  CBC: Recent Labs  Lab 02/24/18 1746  WBC 28.7*  NEUTROABS 10.3*  HGB 13.8  HCT 44.1  MCV 99.1  PLT 628   Basic Metabolic Panel: Recent Labs  Lab 02/24/18 1746  NA 137  K 6.6*  CL 97*  CO2 30  GLUCOSE 121*  BUN 81*  CREATININE 3.64*  CALCIUM 9.2  MG 2.6*   GFR: CrCl cannot be calculated (Unknown ideal weight.). Liver Function Tests: Recent Labs  Lab 02/24/18 1746  AST 18  ALT 15  ALKPHOS 91  BILITOT 0.6  PROT 6.9  ALBUMIN 3.3*   No results for input(s): LIPASE, AMYLASE in the last 168 hours. No results for input(s): AMMONIA in the last 168 hours. Coagulation Profile: No results for input(s): INR, PROTIME in the last 168 hours. Cardiac Enzymes: Recent Labs  Lab 02/24/18 1746  CKTOTAL 46*   BNP (last 3 results) No results for input(s): PROBNP in the last 8760 hours. HbA1C: No results for input(s): HGBA1C in the last 72 hours. CBG: No results for input(s): GLUCAP in the last 168 hours. Lipid Profile: No results for input(s): CHOL, HDL, LDLCALC, TRIG, CHOLHDL, LDLDIRECT in the last 72 hours. Thyroid Function Tests: No results for input(s): TSH, T4TOTAL, FREET4, T3FREE, THYROIDAB in the last 72 hours. Anemia Panel: No results for input(s): VITAMINB12, FOLATE, FERRITIN, TIBC, IRON, RETICCTPCT in the last 72 hours. Urine analysis:    Component Value Date/Time     COLORURINE YELLOW 02/24/2018 1733   APPEARANCEUR CLEAR 02/24/2018 1733   LABSPEC 1.013 02/24/2018 1733   PHURINE 6.0 02/24/2018 1733   GLUCOSEU NEGATIVE 02/24/2018 1733   HGBUR SMALL (A) 02/24/2018 1733   BILIRUBINUR NEGATIVE 02/24/2018 1733   KETONESUR NEGATIVE 02/24/2018 1733   PROTEINUR NEGATIVE 02/24/2018 1733   NITRITE NEGATIVE 02/24/2018 1733   LEUKOCYTESUR NEGATIVE 02/24/2018 1733    Radiological Exams on Admission: Dg Lumbar Spine Complete  Result Date: 02/24/2018 CLINICAL DATA:  Low back pain after a fall. Initial encounter. EXAM: LUMBAR SPINE - COMPLETE 4+ VIEW COMPARISON:  None. FINDINGS: There appears to be transitional lumbosacral anatomy which balloon be considered a partially lumbarized S1 for the purposes of this report. Severe thoracic rotatory dextroscoliosis is partially visualized with compensatory moderate levoconvex lumbar curvature. Within limitations of obliquity created by the scoliosis, no fracture is identified. No listhesis is seen  in the sagittal plane from L3-4 inferiorly with assessment of the upper lumbar spine limited by obliquity. Moderately bulky vertebral osteophyte formation is noted at L3-4. There is mild diffuse gaseous bowel distension throughout the visualized abdomen. IMPRESSION: Severe thoracolumbar scoliosis without acute osseous abnormality identified. Electronically Signed   By: Logan Bores M.D.   On: 02/24/2018 18:14   Dg Abdomen 1 View  Result Date: 02/24/2018 CLINICAL DATA:  Constipation, abdominal distention. EXAM: ABDOMEN - 1 VIEW COMPARISON:  None. FINDINGS: The bowel gas pattern is normal. Phleboliths are noted in the pelvis. Mild amount of stool seen throughout the colon. IMPRESSION: Mild stool burden is noted. No evidence of bowel obstruction or ileus. Electronically Signed   By: Marijo Conception, M.D.   On: 02/24/2018 11:38    EKG: Independently reviewed.  Assessment/Plan Principal Problem:   Acute kidney failure (HCC) Active  Problems:   CLL (chronic lymphocytic leukemia) (HCC)   Post-polio syndrome   Constipation   Hyperkalemia, diminished renal excretion   Delirium   Acute bilateral obstructive uropathy    1. AKF - appears to be obstructive uropathy with 800cc + UOP immediately after foley placement and 2L now UOP since foley placement ~1-2 hours ago.  Could have pre-renal component as well given HPI 1. IVF: 2L NS in ED + 125 cc/hr NS for now 2. Repeat BMP at [facility] and 0500 3. Call nephrology in AM if not improving 4. But anticipate renal recovery given the large volume UOP since foley placement 2. Hyperkalemia - 1. Insulin, glucose, calcium in ED 2. Temp measures in ED 3. Repeat BMPs at [facility] and tomorrow AM 4. Tele monitor 5. Anticipate rapid improvement / resolution given the current post obstructive diuresis we are seeing. 6. Repeat temp measures later tonight if needed. 7. Call nephrology in AM if needed 3. Constipation - 1. Improved with a BM in the ED following enema 2. Continue docusate, add senna 4. CLL - chronic 5. Delirium - due to above, improved  DVT prophylaxis: Heparin [place] Code Status: Full Family Communication: Family at bedside Disposition Plan: Home after admit Consults called: None Admission status: Admit to inpatient  Severity of Illness: The appropriate patient status for this patient is INPATIENT. Inpatient status is judged to be reasonable and necessary in order to provide the required intensity of service to ensure the patient's safety. The patient's presenting symptoms, physical exam findings, and initial radiographic and laboratory data in the context of their chronic comorbidities is felt to place them at high risk for further clinical deterioration. Furthermore, it is not anticipated that the patient will be medically stable for discharge from the hospital within 2 midnights of admission. The following factors support the patient status of inpatient.   " The patient's  presenting symptoms include constipation, AMS. " The worrisome physical exam findings include AMS. " The initial radiographic and laboratory data are worrisome because of AKF with creat 3.6 previously <1, BUN 81, K of 6.6 with peaked T waves on EKG. " The chronic co-morbidities include post-polio syndrome.   * I certify that at the point of admission it is my clinical judgment that the patient will require inpatient hospital care spanning beyond 2 midnights from the point of admission due to high intensity of service, high risk for further deterioration and high frequency of surveillance required.Etta Quill DO [facility] Pager [phone] Only works nights!  If 7AM-7PM, please contact the primary day team physician taking care of patient  [email or web address] Password [email or web address]  02/24/2018, 10:24 PM

## 2018-02-24 NOTE — Progress Notes (Signed)
2nd shift ED CSW received handoff from 1st shift ED RN CM and the CSW covering the ED earlier.  [facility] ED CSW on duty for 2nd shift standing by should any further CSW needs arise.  CSW will continue to follow for D/C needs.  Alphonse Guild. Quyen Cutsforth, LCSW, LCAS, CSI Clinical Social Worker Ph: [phone]

## 2018-02-24 NOTE — ED Notes (Signed)
Pt had small bowel movement post enema. Pt states feeling relief and improvement.

## 2018-02-24 NOTE — ED Notes (Signed)
Report given to Lisa RN

## 2018-02-24 NOTE — ED Notes (Signed)
Pt with another trace amt of stool passed. Liquid stool.

## 2018-02-24 NOTE — ED Triage Notes (Signed)
Pt brought in by EMS, c/o constipation. Pt states his last bm was approximately 5 days ago. Pt had a fall 8 days ago. Denies abdominal pain but continues with L hip pain.

## 2018-02-25 ENCOUNTER — Other Ambulatory Visit: Payer: Self-pay

## 2018-02-25 DIAGNOSIS — D72829 Elevated white blood cell count, unspecified: Secondary | ICD-10-CM | POA: Diagnosis present

## 2018-02-25 LAB — BASIC METABOLIC PANEL
Anion gap: 10 (ref 5–15)
Anion gap: 10 (ref 5–15)
BUN: 47 mg/dL — AB (ref 8–23)
BUN: 61 mg/dL — AB (ref 8–23)
CHLORIDE: 104 mmol/L (ref 98–111)
CO2: 26 mmol/L (ref 22–32)
CO2: 26 mmol/L (ref 22–32)
CREATININE: 1.68 mg/dL — AB (ref 0.61–1.24)
CREATININE: 2.48 mg/dL — AB (ref 0.61–1.24)
Calcium: 8.4 mg/dL — ABNORMAL LOW (ref 8.9–10.3)
Calcium: 8.3 mg/dL — ABNORMAL LOW (ref 8.9–10.3)
Chloride: 103 mmol/L (ref 98–111)
GFR calc Af Amer: 26 mL/min — ABNORMAL LOW (ref >60)
GFR calc Af Amer: 42 mL/min — ABNORMAL LOW (ref >60)
GFR calc non Af Amer: 22 mL/min — ABNORMAL LOW (ref >60)
GFR calc non Af Amer: 36 mL/min — ABNORMAL LOW (ref >60)
Glucose, Bld: 111 mg/dL — ABNORMAL HIGH (ref 70–99)
Glucose, Bld: 101 mg/dL — ABNORMAL HIGH (ref 70–99)
POTASSIUM: 4.6 mmol/L (ref 3.5–5.1)
Potassium: 5.2 mmol/L — ABNORMAL HIGH (ref 3.5–5.1)
SODIUM: 139 mmol/L (ref 135–145)
SODIUM: 140 mmol/L (ref 135–145)

## 2018-02-25 LAB — MRSA PCR SCREENING: MRSA by PCR: NEGATIVE

## 2018-02-25 MED ORDER — SODIUM CHLORIDE 0.9 % IV SOLN
INTRAVENOUS | Status: AC
  Administered 2018-02-25 (×2): via INTRAVENOUS

## 2018-02-25 MED ORDER — ENSURE ENLIVE PO LIQD
237.0000 mL | Freq: Two times a day (BID) | ORAL | Status: DC
  Administered 2018-02-25 – 2018-02-26 (×4): 237 mL via ORAL

## 2018-02-25 MED ORDER — LORAZEPAM 0.5 MG PO TABS
0.5000 mg | ORAL_TABLET | Freq: Two times a day (BID) | ORAL | Status: DC
  Administered 2018-02-25 – 2018-02-27 (×4): 0.5 mg via ORAL
  Filled 2018-02-25 (×4): qty 1

## 2018-02-25 NOTE — Progress Notes (Signed)
Nutrition Follow-up  DOCUMENTATION CODES:   Not applicable  INTERVENTION:  - Continue Ensure Enlive po BID, each supplement provides 350 kcal and 20 grams of protein - Diet liberalized from Heart Healthy to Regular. - Provided education on fiber and adequate fluids. - Continue to encourage PO intakes.    NUTRITION DIAGNOSIS:   Inadequate oral intake related to acute illness, poor appetite as evidenced by per patient/family report, meal completion < 50%.  GOAL:   Patient will meet greater than or equal to 90% of their needs  MONITOR:   PO intake, Supplement acceptance, Weight trends, Labs  REASON FOR ASSESSMENT:   Malnutrition Screening Tool  ASSESSMENT:   84 y.o. male with medical history significant of post-polio syndrome and CLL. Patient presented to the ED with c/o no BM in 5 days, nausea without vomiting, poor PO intake. No fevers, chills, back pain. Constipation not helped despite trying mag citrate at home. Does have pelvic tenderness and difficulty urinating.  No weight recorded since 03/02/17 when patient weighed 90.7 kg (199 lb). Per review of Care Everywhere, weight on 11/19/14 at [facility] was 210 lb. Will monitor for updated weight this admission.  No intakes documented since admission. Breakfast tray in room: 50% completion of grits, 100% completion of orange juice, 25% completion of scrambled eggs, toast and sausage untouched. Patient denies abdominal pain or nausea this AM. He reports some intermittent nausea without vomiting and no abdominal pain/pressure PTA. Noted that patient had not had a BM x5 days BM; flow sheet indicates he did have a BM today.   Two sons were at bedside and were, in addition to patient, able to provide information. He lives in a retirement community and meals are available in the dining room three times/day. Patient typically wakes up around 10-11 AM and eats a late breakfast in his room: cereal, a banana, and a cup of coffee. He then eats  dinner in the dining room, variable each day. Patient also drinks a protein shake (made with Pro30 protein powder) once/day. He drank ~1/3 of an Ensure this AM and likes this supplement.   He is wheelchair bound and is unable to use/has no strength in lower body, per son. Son also reports that patient was "self-regulating" fluid intake PTA d/t it being laborious to use the restroom and that this led to dehydration and constipation. Talked with family about purchasing a container that has (or marking on their own) times throughout the day by which patient should drink a given amount of fluid. Family interested in information on increased fiber intake; provided "High Fiber Nutrition Therapy" handout from the Academy of [employer] Diet[employer].    Medications reviewed; 1 g IV Ca gluconate x1 dose yesterday, 100 mg Colace BID, 1 tablet Senokot once/day. Labs reviewed; BUN: 47 mg/dL, creatinine: 1.68 mg/dL, Ca: 8.3 mg/dL, GFR: 36 mL/min.  IVF; NS @ 125 mL/hr.      NUTRITION - FOCUSED PHYSICAL EXAM:  Completed to upper body only given PMH; no muscle or fat wasting noted, no edema at this time.   Diet Order:   Diet Order            Diet regular Room service appropriate? Yes; Fluid consistency: Thin  Diet effective now              EDUCATION NEEDS:   Education needs have been addressed  Skin:  Skin Assessment: Reviewed RN Assessment  Last BM:  10/18  Height:   Ht Readings from Last 1 Encounters:  04/12/17 5\' 11"  (1.803 m)    Weight:   Wt Readings from Last 1 Encounters:  03/02/17 90.7 kg    Ideal Body Weight:     BMI:  There is no height or weight on file to calculate BMI.  Estimated Nutritional Needs:   Kcal:  1600-1800  Protein:  60-70 grams  Fluid:  >/= 2 L/day     Jarome Matin, MS, RD, LDN, [facility] Inpatient Clinical Dietitian Pager # [phone] After hours/weekend pager # [phone]

## 2018-02-25 NOTE — Progress Notes (Signed)
02/25/18  1616  Per patient's son after patient worked with PT patient became more confused.

## 2018-02-25 NOTE — Evaluation (Signed)
Physical Therapy Evaluation Patient Details Name: Manuel Reeves MRN: [id number] DOB: 11-16-1932 Today's Date: 02/25/2018   History of Present Illness  84 y.o. male with medical history significant of post-polio syndrome, CLL and admitted for acute kidney failure, also had recent fall at his ILF  Clinical Impression  Pt admitted with above diagnosis. Pt currently with functional limitations due to the deficits listed below (see PT Problem List).  Pt will benefit from skilled PT to increase their independence and safety with mobility to allow discharge to the venue listed below.   Pt is used to performing his transfers to his power w/c in his own environment as well as using his own methods however family reports these methods are no longer working and pt requires more assist lately.  Pt assisted with transfer board to his power w/c and required total assist +2.  Recommend d/c to SNF for improving weakness, balance and mobility prior to d/c back to ILF.     Follow Up Recommendations SNF    Equipment Recommendations  None recommended by PT    Recommendations for Other Services       Precautions / Restrictions Precautions Precautions: Fall Precaution Comments: power w/c      Mobility  Bed Mobility Overal bed mobility: Needs Assistance Bed Mobility: Supine to Sit     Supine to sit: Max assist;+2 for physical assistance;+2 for safety/equipment;HOB elevated     General bed mobility comments: attempted to perform bed mobility as pt would at home; pt requiring increased assist, increased time and effort as well  Transfers Overall transfer level: Needs assistance Equipment used: Sliding board Transfers: Lateral/Scoot Transfers          Lateral/Scoot Transfers: Total assist;+2 physical assistance;With slide board;From elevated surface General transfer comment: assisted pt with set up and then pt required total physical assist to complete transfer  Ambulation/Gait              General Gait Details: nonambulatory  Stairs            Wheelchair Mobility    Modified Rankin (Stroke Patients Only)       Balance Overall balance assessment: Needs assistance Sitting-balance support: Bilateral upper extremity supported;Feet supported Sitting balance-Leahy Scale: Zero Sitting balance - Comments: requires trunk support externally as well as needs Bil UE support Postural control: Posterior lean                                   Pertinent Vitals/Pain Pain Assessment: No/denies pain    Home Living Family/patient expects to be discharged to:: Skilled nursing facility Living Arrangements: Spouse/significant other Available Help at Discharge: Family Type of Home: Independent living facility         Home Equipment: Wheelchair - power;Other (comment)(adjustable bed, transfer board)      Prior Function Level of Independence: Independent with assistive device(s)         Comments: pt is typically modified independent with transfer board and power w/c however has been requiring more assist from caregivers and family in the last 4 weeks     Hand Dominance        Extremity/Trunk Assessment   Upper Extremity Assessment Upper Extremity Assessment: Generalized weakness    Lower Extremity Assessment Lower Extremity Assessment: RLE deficits/detail;LLE deficits/detail RLE Deficits / Details: post-polio flaccid LEs LLE Deficits / Details: post-polio flaccid LEs    Cervical / Trunk Assessment Cervical / Trunk Assessment:  Kyphotic;Other exceptions Cervical / Trunk Exceptions: scoliosis  Communication   Communication: No difficulties  Cognition Arousal/Alertness: Awake/alert Behavior During Therapy: WFL for tasks assessed/performed Overall Cognitive Status: Impaired/Different from baseline                                 General Comments: pt alert and following commands, stated he was from [place] and then end  of session thought he was in [place] Comments      Exercises     Assessment/Plan    PT Assessment Patient needs continued PT services  PT Problem List Decreased strength;Decreased mobility;Decreased balance;Decreased knowledge of use of DME;Decreased activity tolerance;Decreased cognition;Decreased safety awareness       PT Treatment Interventions DME instruction;Therapeutic activities;Therapeutic exercise;Patient/family education;Stair training;Balance training;Functional mobility training    PT Goals (Current goals can be found in the Care Plan section)  Acute Rehab PT Goals PT Goal Formulation: With patient/family Time For Goal Achievement: 03/11/18 Potential to Achieve Goals: Fair    Frequency Min 2X/week   Barriers to discharge        Co-evaluation               AM-PAC PT "6 Clicks" Daily Activity  Outcome Measure Difficulty turning over in bed (including adjusting bedclothes, sheets and blankets)?: Unable Difficulty moving from lying on back to sitting on the side of the bed? : Unable Difficulty sitting down on and standing up from a chair with arms (e.g., wheelchair, bedside commode, etc,.)?: Unable Help needed moving to and from a bed to chair (including a wheelchair)?: Total Help needed walking in hospital room?: Total Help needed climbing 3-5 steps with a railing? : Total 6 Click Score: 6    End of Session   Activity Tolerance: Patient limited by fatigue Patient left: in chair;with call bell/phone within reach;with family/visitor present(pt up in his power w/c, family present and agreeable to call for assist back to bed) Nurse Communication: Need for lift equipment(RN aware lift pad for maximove under pt in his power w/c to assist with transfering back to bed) PT Visit Diagnosis: Muscle weakness (generalized) (M62.81);Other abnormalities of gait and mobility (R26.89)    Time: 7628-3151 PT Time Calculation (min) (ACUTE ONLY): 33  min   Charges:   PT Evaluation $PT Eval Moderate Complexity: 1 Mod PT Treatments $Therapeutic Activity: 8-22 mins      Carmelia Bake, PT, DPT Acute Rehabilitation Services Office: [phone] Pager: [phone]  Trena Platt 02/25/2018, 3:41 PM

## 2018-02-25 NOTE — Progress Notes (Addendum)
PROGRESS NOTE  Manuel Reeves [id number]:[id number] DOB: Apr 19, 1933 DOA: 02/24/2018 PCP: Crist Infante, MD  HPI/Brief Narrative  Manuel Reeves is a 84 y.o. year old male with medical history significant for postpolio syndrome, CLL who presented on 02/24/2018 with acute change in mental status over the past 2 to 3 days, diminished appetite, abdominal pain/back pain, and decreased urination.  And was found to have postobstructive AKI presumed secondary to constipation. Subjective Patient doing well this morning.  Has no acute complaints.  Had normal BM overnight.  Urinating via foley  Assessment/Plan:  #AKI with hyperkalemia, post obstructive, improving.  Creatinine on admission greater than 3 has now improved significantly to 1.65 after initiation of Foley consistent with postobstructive etiology. Hyperkalemia resolved as well  Suspect likely related to constipation which is now resolved.  - voiding trial open (removal of Foley catheter) with scheduled bladder scans to ensure no longer retaining.  UA wnl -If patient retaining may need to put back in Foley and consult urology to allow an outpatient referral for further urologic testing (does have history of BPH per sons), in and out cath for now -BMP in am -Continue supportive fluids x 12 hours  #AMS, improving.  Currently alert and oriented x4 which is his baseline.  Sounds reportedly had some intermittent confusion prior to admission consistent with delirium most likely related to worsening kidney function and mildly constipation as mentioned above. - delirium precautions  #Constipation, improving. S/p enema in ED with bowel movement. Moderate stool burden on abdominal xr with no obstruction - Continue scheduled senna, add MiraLAX, monitor BM  #Leukocytosis. Suspect stress related to above. Has remained afebrile. No localized signs or symptoms of infection. ife febrile culture. Monitor daily CBC  #Depression, stable. -Continue home  Zoloft, wellbutrin -decrease ativan to BID dosing ( son's report increased agitation on TID dosing)   Code Status: Full code   Family Communication: sons updated at bedside   Disposition Plan: voiding trial over 24 hours, if retaining will need foley and urology outpatient referral ( discussed with sons). PT recommends SNF( coming from [place])   Consultants:  none     Procedures:  none   Antimicrobials: Anti-infectives (From admission, onward)   None         Cultures:  none  Telemetry:no  DVT prophylaxis: Heparin subcut  Objective: Vitals:   02/24/18 2230 02/24/18 2300 02/25/18 0010 02/25/18 0629  BP: (!) 166/91 (!) 168/83 (!) 156/85 (!) 141/78  Pulse: 96 99 98 94  Resp: 19 (!) 26 (!) 24 (!) 22  Temp:   97.8 F (36.6 C) 98.7 F (37.1 C)  TempSrc:   Oral Oral  SpO2: 93% 93% (!) 82% 93%    Intake/Output Summary (Last 24 hours) at 02/25/2018 1039 Last data filed at 02/25/2018 0645 Gross per 24 hour  Intake 1675.63 ml  Output 3950 ml  Net -2274.37 ml   There were no vitals filed for this visit.  Exam: Gen lying in bed in no distress Eyes EOMI, anicteric sclera CV RRR, no murmurs or gallops REsp normal respiratory effort on room air Abd soft, non distended, nontender, normal bowel sounds Extremities: 1+ pitting edema from ankle to low calf Neuro- unable to move legs bilaterally. Alert, oriented to person, place, time, and context    Data Reviewed: CBC: Recent Labs  Lab 02/24/18 1746  WBC 28.7*  NEUTROABS 10.3*  HGB 13.8  HCT 44.1  MCV 99.1  PLT 361   Basic Metabolic Panel: Recent Labs  Lab 02/24/18 1746 02/25/18 0026 02/25/18 0512  NA 137 140 139  K 6.6* 5.2* 4.6  CL 97* 104 103  CO2 30 26 26   GLUCOSE 121* 111* 101*  BUN 81* 61* 47*  CREATININE 3.64* 2.48* 1.68*  CALCIUM 9.2 8.4* 8.3*  MG 2.6*  --   --    GFR: CrCl cannot be calculated (Unknown ideal weight.). Liver Function Tests: Recent Labs  Lab 02/24/18 1746  AST 18   ALT 15  ALKPHOS 91  BILITOT 0.6  PROT 6.9  ALBUMIN 3.3*   No results for input(s): LIPASE, AMYLASE in the last 168 hours. No results for input(s): AMMONIA in the last 168 hours. Coagulation Profile: No results for input(s): INR, PROTIME in the last 168 hours. Cardiac Enzymes: Recent Labs  Lab 02/24/18 1746  CKTOTAL 46*   BNP (last 3 results) No results for input(s): PROBNP in the last 8760 hours. HbA1C: No results for input(s): HGBA1C in the last 72 hours. CBG: No results for input(s): GLUCAP in the last 168 hours. Lipid Profile: No results for input(s): CHOL, HDL, LDLCALC, TRIG, CHOLHDL, LDLDIRECT in the last 72 hours. Thyroid Function Tests: No results for input(s): TSH, T4TOTAL, FREET4, T3FREE, THYROIDAB in the last 72 hours. Anemia Panel: No results for input(s): VITAMINB12, FOLATE, FERRITIN, TIBC, IRON, RETICCTPCT in the last 72 hours. Urine analysis:    Component Value Date/Time   COLORURINE YELLOW 02/24/2018 1733   APPEARANCEUR CLEAR 02/24/2018 1733   LABSPEC 1.013 02/24/2018 1733   PHURINE 6.0 02/24/2018 1733   GLUCOSEU NEGATIVE 02/24/2018 1733   HGBUR SMALL (A) 02/24/2018 1733   BILIRUBINUR NEGATIVE 02/24/2018 1733   KETONESUR NEGATIVE 02/24/2018 1733   PROTEINUR NEGATIVE 02/24/2018 1733   NITRITE NEGATIVE 02/24/2018 1733   LEUKOCYTESUR NEGATIVE 02/24/2018 1733   Sepsis Labs: @LABRCNTIP (procalcitonin:4,lacticidven:4)  )No results found for this or any previous visit (from the past 240 hour(s)).    Studies: Dg Lumbar Spine Complete  Result Date: 02/24/2018 CLINICAL DATA:  Low back pain after a fall. Initial encounter. EXAM: LUMBAR SPINE - COMPLETE 4+ VIEW COMPARISON:  None. FINDINGS: There appears to be transitional lumbosacral anatomy which balloon be considered a partially lumbarized S1 for the purposes of this report. Severe thoracic rotatory dextroscoliosis is partially visualized with compensatory moderate levoconvex lumbar curvature. Within  limitations of obliquity created by the scoliosis, no fracture is identified. No listhesis is seen in the sagittal plane from L3-4 inferiorly with assessment of the upper lumbar spine limited by obliquity. Moderately bulky vertebral osteophyte formation is noted at L3-4. There is mild diffuse gaseous bowel distension throughout the visualized abdomen. IMPRESSION: Severe thoracolumbar scoliosis without acute osseous abnormality identified. Electronically Signed   By: Logan Bores M.D.   On: 02/24/2018 18:14   Dg Abdomen 1 View  Result Date: 02/24/2018 CLINICAL DATA:  Constipation, abdominal distention. EXAM: ABDOMEN - 1 VIEW COMPARISON:  None. FINDINGS: The bowel gas pattern is normal. Phleboliths are noted in the pelvis. Mild amount of stool seen throughout the colon. IMPRESSION: Mild stool burden is noted. No evidence of bowel obstruction or ileus. Electronically Signed   By: Marijo Conception, M.D.   On: 02/24/2018 11:38    Scheduled Meds: . docusate sodium  100 mg Oral BID  . feeding supplement (ENSURE ENLIVE)  237 mL Oral BID BM  . heparin  5,000 Units Subcutaneous Q8H  . LORazepam  0.5 mg Oral Q8H  . senna  1 tablet Oral Daily  . sertraline  50 mg Oral Daily  Continuous Infusions: . sodium chloride 125 mL/hr at 02/25/18 0441     LOS: 1 day     Desiree Hane, MD [facility] Pager [phone]  If 7PM-7AM, please contact night-coverage [email or web address] Password [email or web address] 02/25/2018, 10:39 AM

## 2018-02-25 NOTE — ED Provider Notes (Signed)
[facility] Provider Note   CSN: [id number] Arrival date & time: 02/24/18  5465     History   Chief Complaint Chief Complaint  Patient presents with  . Constipation    HPI Manuel Reeves is a 84 y.o. male.  HPI   Received care of patient from Dr. Eulis Foster.  Patient had initially presented to the emergency department with concern for constipation, Dr. Eulis Foster provided a smog enema, with improvement of patient's symptoms, patient having a bowel movement.  The patient did not have acute concerns, and was being prepared for discharge home, when the family came and reported that he has not been acting himself.  They report that over the last 1 to 2 weeks, he has had progressive generalized weakness.  Reports he does have a history of polio, however he is normally able to do transfer to his chair independently, however he has been able unable to do that in the last week.  Reports significant worsening and progressive generalized weakness.  Also report he has not been eating or drinking much over the last 3 days.  He has had nausea at home, but no vomiting.  He has not had a bowel movement for 5 days up until receiving the enema in the emergency department.  Family reports that he had a fall, and had CT done at the time which was within normal limits.  Since that time he has had back pain, and has not had imaging.  Past Medical History:  Diagnosis Date  . Anxiety   . CLL (chronic lymphocytic leukemia) ([place]) 02/20/2014  . Osteoporosis   . Polio     Patient Active Problem List   Diagnosis Date Noted  . 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 Acute kidney failure ([place]) 02/24/2018  . Constipation 02/24/2018  . Hyperkalemia, diminished renal excretion 02/24/2018  . Delirium 02/24/2018  . Acute bilateral obstructive uropathy 02/24/2018  . CLL (chronic lymphocytic leukemia) ([place]) 02/20/2014    Past Surgical History:  Procedure Laterality Date  .  CATARACT EXTRACTION  09/2011   at [facility]  . COLONOSCOPY  03/2007  . RETINAL DETACHMENT SURGERY  09/1995  . SPINAL FUSION  10/1952        Home Medications    Prior to Admission medications   Medication Sig Start Date End Date Taking? Authorizing Provider  buPROPion (WELLBUTRIN SR) 150 MG 12 hr tablet Take 1 tablet by mouth Daily. 02/24/12  Yes [provider]  Calcium Carbonate-Vitamin D (CALTRATE 600+D PO) Take 1 tablet by mouth daily.   Yes [provider]  Docusate Calcium (STOOL SOFTENER PO) Take 1 capsule by mouth daily.   Yes [provider]  Ergocalciferol (VITAMIN D2 PO) Take 50,000 Units by mouth every 14 (fourteen) days.    Yes [provider]  LORazepam (ATIVAN) 0.5 MG tablet Take 0.5 mg by mouth every 8 (eight) hours.   Yes [provider]  Multiple Vitamin (MULTIVITAMIN) tablet Take 1 tablet by mouth daily.   Yes [provider]  Multiple Vitamins-Minerals (OCUVITE PO) Take 1 tablet by mouth daily.   Yes [provider]  sertraline (ZOLOFT) 50 MG tablet Take 50 mg by mouth daily.   Yes [provider]  polyethylene glycol (MIRALAX / GLYCOLAX) packet Take 17 g by mouth 2 (two) times daily. 02/24/18   Daleen Bo, MD    Family History Family History  Problem Relation Age of Onset  . Colon cancer Sister 53  Social History Social History   Tobacco Use  . Smoking status: Never Smoker  . Smokeless tobacco: Never Used  Substance Use Topics  . Alcohol use: Yes    Comment: occasional beer or wine manybe monthly  . Drug use: No     Allergies   Penicillins   Review of Systems Review of Systems  Constitutional: Positive for fatigue. Negative for fever.  HENT: Negative for sore throat.   Eyes: Negative for visual disturbance.  Respiratory: Negative for shortness of breath.   Cardiovascular: Negative for chest pain.  Gastrointestinal: Positive for constipation and nausea. Negative for  abdominal pain and vomiting.  Genitourinary: Positive for frequency. Negative for dysuria.  Musculoskeletal: Negative for back pain and neck stiffness.  Skin: Negative for rash.  Neurological: Negative for syncope and headaches.  Psychiatric/Behavioral: Positive for confusion.     Physical Exam Updated Vital Signs BP (!) 156/85   Pulse 98   Temp 97.8 F (36.6 C) (Oral)   Resp (!) 24   SpO2 (!) 82%   Physical Exam  Constitutional: He appears well-developed and well-nourished. No distress.  HENT:  Head: Normocephalic and atraumatic.  Eyes: Conjunctivae and EOM are normal.  Neck: Normal range of motion.  Cardiovascular: Normal rate, regular rhythm, normal heart sounds and intact distal pulses. Exam reveals no gallop and no friction rub.  No murmur heard. Pulmonary/Chest: Effort normal and breath sounds normal. No respiratory distress. He has no wheezes. He has no rales.  Abdominal: Soft. He exhibits distension. There is no tenderness. There is no guarding.  Musculoskeletal: He exhibits no edema.  Scoliosis Tenderness left lateral back  Neurological: He is alert.  Skin: Skin is warm and dry. He is not diaphoretic.  Nursing note and vitals reviewed.    ED Treatments / Results  Labs (all labs ordered are listed, but only abnormal results are displayed) Labs Reviewed  CBC WITH DIFFERENTIAL/PLATELET - Abnormal; Notable for the following components:      Result Value   WBC 28.7 (*)    Neutro Abs 10.3 (*)    Lymphs Abs 17.3 (*)    Abs Immature Granulocytes 0.18 (*)    All other components within normal limits  COMPREHENSIVE METABOLIC PANEL - Abnormal; Notable for the following components:   Potassium 6.6 (*)    Chloride 97 (*)    Glucose, Bld 121 (*)    BUN 81 (*)    Creatinine, Ser 3.64 (*)    Albumin 3.3 (*)    GFR calc non Af Amer 14 (*)    GFR calc Af Amer 16 (*)    All other components within normal limits  URINALYSIS, ROUTINE W REFLEX MICROSCOPIC - Abnormal;  Notable for the following components:   Hgb urine dipstick SMALL (*)    All other components within normal limits  CK - Abnormal; Notable for the following components:   Total CK 46 (*)    All other components within normal limits  MAGNESIUM - Abnormal; Notable for the following components:   Magnesium 2.6 (*)    All other components within normal limits  BASIC METABOLIC PANEL - Abnormal; Notable for the following components:   Potassium 5.2 (*)    Glucose, Bld 111 (*)    BUN 61 (*)    Creatinine, Ser 2.48 (*)    Calcium 8.4 (*)    GFR calc non Af Amer 22 (*)    GFR calc Af Amer 26 (*)    All other components within normal limits  URINE CULTURE  URIC ACID  BASIC METABOLIC PANEL    EKG EKG Interpretation  Date/Time:  Thursday February 24 2018 19:48:18 EDT Ventricular Rate:  96 PR Interval:    QRS Duration: 86 QT Interval:  317 QTC Calculation: 401 R Axis:   35 Text Interpretation:  Sinus rhythm Left atrial enlargement Borderline low voltage, extremity leads No previous ECGs available Confirmed by Gareth Morgan [phone]) on 02/24/2018 7:54:43 PM   Radiology Dg Lumbar Spine Complete  Result Date: 02/24/2018 CLINICAL DATA:  Low back pain after a fall. Initial encounter. EXAM: LUMBAR SPINE - COMPLETE 4+ VIEW COMPARISON:  None. FINDINGS: There appears to be transitional lumbosacral anatomy which balloon be considered a partially lumbarized S1 for the purposes of this report. Severe thoracic rotatory dextroscoliosis is partially visualized with compensatory moderate levoconvex lumbar curvature. Within limitations of obliquity created by the scoliosis, no fracture is identified. No listhesis is seen in the sagittal plane from L3-4 inferiorly with assessment of the upper lumbar spine limited by obliquity. Moderately bulky vertebral osteophyte formation is noted at L3-4. There is mild diffuse gaseous bowel distension throughout the visualized abdomen. IMPRESSION: Severe thoracolumbar  scoliosis without acute osseous abnormality identified. Electronically Signed   By: Logan Bores M.D.   On: 02/24/2018 18:14   Dg Abdomen 1 View  Result Date: 02/24/2018 CLINICAL DATA:  Constipation, abdominal distention. EXAM: ABDOMEN - 1 VIEW COMPARISON:  None. FINDINGS: The bowel gas pattern is normal. Phleboliths are noted in the pelvis. Mild amount of stool seen throughout the colon. IMPRESSION: Mild stool burden is noted. No evidence of bowel obstruction or ileus. Electronically Signed   By: Marijo Conception, M.D.   On: 02/24/2018 11:38    Procedures .Critical Care Performed by: Gareth Morgan, MD Authorized by: Gareth Morgan, MD   Critical care provider statement:    Critical care time (minutes):  30   Critical care was time spent personally by me on the following activities:  Evaluation of patient's response to treatment, examination of patient, ordering and performing treatments and interventions, ordering and review of laboratory studies, ordering and review of radiographic studies, pulse oximetry, re-evaluation of patient's condition, obtaining history from patient or surrogate, review of old charts and development of treatment plan with patient or surrogate   (including critical care time)  Medications Ordered in ED Medications  LORazepam (ATIVAN) tablet 0.5 mg (0.5 mg Oral Given 02/24/18 2328)  sertraline (ZOLOFT) tablet 50 mg (50 mg Oral Given 02/24/18 1713)  0.9 %  sodium chloride infusion ( Intravenous New Bag/Given 02/24/18 2015)  acetaminophen (TYLENOL) tablet 650 mg (has no administration in time range)    Or  acetaminophen (TYLENOL) suppository 650 mg (has no administration in time range)  ondansetron (ZOFRAN) tablet 4 mg (has no administration in time range)    Or  ondansetron (ZOFRAN) injection 4 mg (has no administration in time range)  heparin injection 5,000 Units (5,000 Units Subcutaneous Given 02/25/18 0144)  docusate sodium (COLACE) capsule 100 mg (100  mg Oral Given 02/25/18 0144)  senna (SENOKOT) tablet 8.6 mg (has no administration in time range)  feeding supplement (ENSURE ENLIVE) (ENSURE ENLIVE) liquid 237 mL (has no administration in time range)  sorbitol, milk of mag, mineral oil, glycerin (SMOG) enema (450 mLs Rectal Given 02/24/18 1200)  buPROPion (WELLBUTRIN SR) 12 hr tablet 150 mg (150 mg Oral Given 02/24/18 1713)  sodium chloride 0.9 % bolus 1,000 mL (0 mLs Intravenous Stopped 02/24/18 1826)  sodium chloride 0.9 % bolus 1,000 mL (  0 mLs Intravenous Stopped 02/24/18 2114)  insulin aspart (novoLOG) injection 5 Units (5 Units Intravenous Given 02/24/18 2010)  dextrose 50 % solution 50 mL (50 mLs Intravenous Given 02/24/18 2009)  calcium gluconate 1 g/ 50 mL sodium chloride IVPB (0 g Intravenous Stopped 02/24/18 2200)     Initial Impression / Assessment and Plan / ED Course  I have reviewed the triage vital signs and the nursing notes.  Pertinent labs & imaging results that were available during my care of the patient were reviewed by me and considered in my medical decision making (see chart for details).  Clinical Course as of Feb 25 225  Thu Feb 24, 2018  1619 I was called to room by family members were concerned about him being discharged, "like this."  This time the patient is alert, somewhat confused, and agitated.  He has not had any of his daily medications, I ordered Wellbutrin lorazepam and Zoloft.  Contacted Education officer, museum will see the patient and try to arrange for higher level care at his nursing facility.  Family members were appreciative and are in agreement with this plan.   [EW]    Clinical Course User Index [EW] Daleen Bo, MD    84 year old male with a history above including polio, CLL presents with concern for constipation, with family coming in reporting worsening generalized weakness over the last week, nausea, decreased p.o. Intake.  Patient's constipation has improved post enema in the emergency  department.  I have low suspicion for bowel obstruction given patient has been pain is passing flatus, not vomiting, tolerating p.o., with improvement with smog enema.  They report he has some chronic abdominal distention, patient denies abdominal pain, and doubt SBO.  Given history provided by family, will obtain lab work for further evaluation, and give IV fluids with concern for dehydration.  Labs returned showing leukocytosis, and acute on chronic kidney injury.  Patient does not have source of infection as etiology of leukocytosis, suspect this is related to his CLL.  Patient was given calcium, insulin, dextrose for his hyperkalemia, was admitted to the hospital service for further care.  Of note, Foley catheter was placed with large amount of urine output.  Will be admitted for further care.   Final Clinical Impressions(s) / ED Diagnoses   Final diagnoses:  Constipation, unspecified constipation type  Hyperkalemia  Acute kidney injury [facility])    ED Discharge Orders         Ordered    polyethylene glycol (MIRALAX / GLYCOLAX) packet  2 times daily     02/24/18 1447           Gareth Morgan, MD 02/25/18 [phone]

## 2018-02-25 NOTE — Plan of Care (Signed)
Pt A&O X 2. Lab results are improving. Pt is calm and relaxed.

## 2018-02-25 NOTE — Care Management Note (Signed)
Case Management Note  CM consulted by Dr. Eulis Foster for assistance with transitions of care planning.  Pt is at [facility] with his spouse.  CM advised Dr. Eulis Foster a change in level of care at [facility] would be escalated through [facility].  CM consulted [facility] ED CSW for assistance.  CM facilitated communication between [place] and family via phone.  CM noted through conversation with pt, spouse, and son at bedside that pt has had acute changes in the last two weeks, worsening each day.  CM discussed concerns with Dr. Billy Fischer requesting possible further evaluation.  CM will follow for further needs.  CSW updated that pt is not medically clear at this time.  Nyx Keady, Benjaman Lobe, RN 02/24/2018 16:26

## 2018-02-25 NOTE — Plan of Care (Signed)
  Problem: Nutrition: Goal: Adequate nutrition will be maintained Outcome: Progressing   Problem: Pain Managment: Goal: General experience of comfort will improve Outcome: Progressing   Problem: Elimination: Goal: Will not experience complications related to urinary retention Outcome: Not Progressing

## 2018-02-26 DIAGNOSIS — N179 Acute kidney failure, unspecified: Principal | ICD-10-CM

## 2018-02-26 LAB — BASIC METABOLIC PANEL
ANION GAP: 6 (ref 5–15)
BUN: 20 mg/dL (ref 8–23)
CALCIUM: 7.9 mg/dL — AB (ref 8.9–10.3)
CHLORIDE: 105 mmol/L (ref 98–111)
CO2: 26 mmol/L (ref 22–32)
CREATININE: 0.57 mg/dL — AB (ref 0.61–1.24)
GFR calc Af Amer: >60 mL/min (ref >60)
GFR calc non Af Amer: >60 mL/min (ref >60)
GLUCOSE: 100 mg/dL — AB (ref 70–99)
POTASSIUM: 4.3 mmol/L (ref 3.5–5.1)
Sodium: 137 mmol/L (ref 135–145)

## 2018-02-26 LAB — CBC
HEMATOCRIT: 41.3 % (ref 39.0–52.0)
Hemoglobin: 12.8 g/dL — ABNORMAL LOW (ref 13.0–17.0)
MCH: 31.4 pg (ref 26.0–34.0)
MCHC: 31 g/dL (ref 30.0–36.0)
MCV: 101.5 fL — AB (ref 80.0–100.0)
PLATELETS: 304 10*3/uL (ref 150–400)
RBC: 4.07 MIL/uL — ABNORMAL LOW (ref 4.22–5.81)
RDW: 13.3 % (ref 11.5–15.5)
WBC: 19.1 10*3/uL — ABNORMAL HIGH (ref 4.0–10.5)
nRBC: 0 % (ref 0.0–0.2)

## 2018-02-26 LAB — URINE CULTURE: Culture: NO GROWTH

## 2018-02-26 MED ORDER — SORBITOL 70 % SOLN
960.0000 mL | TOPICAL_OIL | Freq: Once | ORAL | Status: AC
  Administered 2018-02-26: 960 mL via RECTAL
  Filled 2018-02-26: qty 473

## 2018-02-26 MED ORDER — GLYCERIN (LAXATIVE) 2.1 G RE SUPP
1.0000 | Freq: Once | RECTAL | Status: DC
  Filled 2018-02-26: qty 1

## 2018-02-26 MED ORDER — POLYETHYLENE GLYCOL 3350 17 G PO PACK
17.0000 g | PACK | Freq: Every day | ORAL | Status: DC
  Administered 2018-02-26 – 2018-02-27 (×2): 17 g via ORAL
  Filled 2018-02-26 (×2): qty 1

## 2018-02-26 MED ORDER — TAMSULOSIN HCL 0.4 MG PO CAPS
0.4000 mg | ORAL_CAPSULE | Freq: Every day | ORAL | Status: DC
  Administered 2018-02-26 – 2018-02-27 (×2): 0.4 mg via ORAL
  Filled 2018-02-26 (×2): qty 1

## 2018-02-26 NOTE — Progress Notes (Signed)
PROGRESS NOTE    Manuel Reeves  NFA:[id number] DOB: 15-Aug-1932 DOA: 02/24/2018 PCP: Crist Infante, MD   Brief Narrative: Patient is a 84 year old male with past medical history of postpolio syndrome, CLL who presented to the emergency department with complaints of altered mental status for 2 to 3 days, diminished appetite, abdominal pain/back pain and decreased urination.  He was found to have postoperative AKI.  His acute kidney injury has resolved.  He is waiting for skilled nursing facility bed.  Assessment & Plan:   Principal Problem:   Acute kidney failure (HCC) Active Problems:   CLL (chronic lymphocytic leukemia) (HCC)   Post-polio syndrome   Constipation   Hyperkalemia, diminished renal excretion   Delirium   Acute bilateral obstructive uropathy   Leukocytosis   Acute kidney injury with hyperkalemia: Most likely postobstuctive.  Resolved with Foley placement.  Foley was discontinued yesterday but he was not able to void so it had to be reinserted.  He will follow-up with urology as an outpatient.  Possible  BPH: Acute kidney injury most likely precipitated by obstructive etiology secondary to BPH.  Started on tamsulosin.  Follow-up with urology as an outpatient.  Constipation: Continue bowel regimen.  Leukocytosis: Most likely reactive.  Leukocytosis has been improving.  Depression: Continue home medications.  Disposition/debility: Sons were concerned about increased difficulty in taking care of the patient at home and requesting for skilled nursing facility.  Social worker has been consulted.  PT evaluated him and recommended skilled nursing facility as well.   DVT prophylaxis: heparin [place] Code Status: Full Family Communication: Discussed with son at the bedside Disposition Plan: SNF as soon as the bed is available   Consultants: None  Procedures:None  Antimicrobials: None  Subjective: Patient seen and examined the bedside this morning.  Remains  comfortable.  No active issues.  Had difficulty voiding last night and Foley catheter had to be reinserted.  Objective: Vitals:   02/25/18 1610 02/25/18 2127 02/25/18 2252 02/26/18 0543  BP: 125/73 (!) 152/82 (!) 155/85 (!) 148/83  Pulse: 93 92 90 86  Resp:  18  16  Temp:  98 F (36.7 C) 98.7 F (37.1 C) 97.9 F (36.6 C)  TempSrc:  Oral Oral Oral  SpO2: 95% 97% 94% 93%    Intake/Output Summary (Last 24 hours) at 02/26/2018 1104 Last data filed at 02/26/2018 0600 Gross per 24 hour  Intake 3573.97 ml  Output 1600 ml  Net 1973.97 ml   There were no vitals filed for this visit.  Examination:  General exam: Appears calm and comfortable ,Not in distress,obese HEENT:PERRL,Oral mucosa moist, Ear/Nose normal on gross exam Respiratory system: Bilateral equal air entry, normal vesicular breath sounds, no wheezes or crackles ,deformity of the thoracic cavity Cardiovascular system: S1 & S2 heard, RRR. No JVD, murmurs, rubs, gallops or clicks. No pedal edema. Gastrointestinal system: Abdomen is nondistended, soft and nontender. No organomegaly or masses felt. Normal bowel sounds heard. Central nervous system: Alert and oriented. Paraplegia Extremities: trace edema, no clubbing ,no cyanosis, distal peripheral pulses palpable.  Contractures of the bilateral lower extremities. Skin: No rashes, lesions or ulcers,no icterus ,no pallor     Data Reviewed: I have personally reviewed following labs and imaging studies  CBC: Recent Labs  Lab 02/24/18 1746 02/26/18 0534  WBC 28.7* 19.1*  NEUTROABS 10.3*  --   HGB 13.8 12.8*  HCT 44.1 41.3  MCV 99.1 101.5*  PLT 398 469   Basic Metabolic Panel: Recent Labs  Lab 02/24/18 1746  02/25/18 0026 02/25/18 0512 02/26/18 0534  NA 137 140 139 137  K 6.6* 5.2* 4.6 4.3  CL 97* 104 103 105  CO2 30 26 26 26   GLUCOSE 121* 111* 101* 100*  BUN 81* 61* 47* 20  CREATININE 3.64* 2.48* 1.68* 0.57*  CALCIUM 9.2 8.4* 8.3* 7.9*  MG 2.6*  --   --   --     GFR: CrCl cannot be calculated (Unknown ideal weight.). Liver Function Tests: Recent Labs  Lab 02/24/18 1746  AST 18  ALT 15  ALKPHOS 91  BILITOT 0.6  PROT 6.9  ALBUMIN 3.3*   No results for input(s): LIPASE, AMYLASE in the last 168 hours. No results for input(s): AMMONIA in the last 168 hours. Coagulation Profile: No results for input(s): INR, PROTIME in the last 168 hours. Cardiac Enzymes: Recent Labs  Lab 02/24/18 1746  CKTOTAL 46*   BNP (last 3 results) No results for input(s): PROBNP in the last 8760 hours. HbA1C: No results for input(s): HGBA1C in the last 72 hours. CBG: No results for input(s): GLUCAP in the last 168 hours. Lipid Profile: No results for input(s): CHOL, HDL, LDLCALC, TRIG, CHOLHDL, LDLDIRECT in the last 72 hours. Thyroid Function Tests: No results for input(s): TSH, T4TOTAL, FREET4, T3FREE, THYROIDAB in the last 72 hours. Anemia Panel: No results for input(s): VITAMINB12, FOLATE, FERRITIN, TIBC, IRON, RETICCTPCT in the last 72 hours. Sepsis Labs: No results for input(s): PROCALCITON, LATICACIDVEN in the last 168 hours.  Recent Results (from the past 240 hour(s))  Urine culture     Status: None   Collection Time: 02/24/18  5:33 PM  Result Value Ref Range Status   Specimen Description   Final    URINE, RANDOM Performed at [facility] [address] [place], [place] [address]    Special Requests   Final    NONE Performed at [facility], [place] [address] [place], [place] [address]    Culture   Final    NO GROWTH Performed at [facility], [place] [address], [place], [place] [address]    Report Status 02/26/2018 FINAL  Final  MRSA PCR Screening     Status: None   Collection Time: 02/25/18  6:33 AM  Result Value Ref Range Status   MRSA by PCR NEGATIVE NEGATIVE Final    Comment:        The GeneXpert MRSA Assay (FDA approved for NASAL specimens only), is one component of a comprehensive MRSA  colonization surveillance program. It is not intended to diagnose MRSA infection nor to guide or monitor treatment for MRSA infections. Performed at [facility], [place] [address] [place], [place] [address]          Radiology Studies: Dg Lumbar Spine Complete  Result Date: 02/24/2018 CLINICAL DATA:  Low back pain after a fall. Initial encounter. EXAM: LUMBAR SPINE - COMPLETE 4+ VIEW COMPARISON:  None. FINDINGS: There appears to be transitional lumbosacral anatomy which balloon be considered a partially lumbarized S1 for the purposes of this report. Severe thoracic rotatory dextroscoliosis is partially visualized with compensatory moderate levoconvex lumbar curvature. Within limitations of obliquity created by the scoliosis, no fracture is identified. No listhesis is seen in the sagittal plane from L3-4 inferiorly with assessment of the upper lumbar spine limited by obliquity. Moderately bulky vertebral osteophyte formation is noted at L3-4. There is mild diffuse gaseous bowel distension throughout the visualized abdomen. IMPRESSION: Severe thoracolumbar scoliosis without acute osseous abnormality identified. Electronically  Signed   By: Logan Bores M.D.   On: 02/24/2018 18:14   Dg Abdomen 1 View  Result Date: 02/24/2018 CLINICAL DATA:  Constipation, abdominal distention. EXAM: ABDOMEN - 1 VIEW COMPARISON:  None. FINDINGS: The bowel gas pattern is normal. Phleboliths are noted in the pelvis. Mild amount of stool seen throughout the colon. IMPRESSION: Mild stool burden is noted. No evidence of bowel obstruction or ileus. Electronically Signed   By: Marijo Conception, M.D.   On: 02/24/2018 11:38        Scheduled Meds: . docusate sodium  100 mg Oral BID  . feeding supplement (ENSURE ENLIVE)  237 mL Oral BID BM  . heparin  5,000 Units Subcutaneous Q8H  . LORazepam  0.5 mg Oral BID  . polyethylene glycol  17 g Oral Daily  . senna  1 tablet Oral Daily  . sertraline  50  mg Oral Daily  . tamsulosin  0.4 mg Oral Daily   Continuous Infusions:   LOS: 2 days    Time spent: 25 mins.More than 50% of that time was spent in counseling and/or coordination of care.      Shelly Coss, MD [facility] Pager [phone]  If 7PM-7AM, please contact night-coverage [email or web address] Password [email or web address] 02/26/2018, 11:04 AM

## 2018-02-26 NOTE — Progress Notes (Signed)
Bladder scan revealed 637 mL urine. MD notified. Order for foley given and foley placed at 0048. 800 mL was removed.   Carney Corners, RN

## 2018-02-26 NOTE — Progress Notes (Signed)
Pt c/o sensation of feeling like he has to have a bowel movement but cannot push it out.  [name] paged. New orders placed.  Will carry out new orders and continue to monitor pt closely. Manuel Reeves I

## 2018-02-26 NOTE — Progress Notes (Signed)
CCMD notified RN of 7 beat run V tach. Patients vitals stable. MD notified. No new orders placed. Will continue to monitor patient.

## 2018-02-27 DIAGNOSIS — D72829 Elevated white blood cell count, unspecified: Secondary | ICD-10-CM | POA: Diagnosis not present

## 2018-02-27 DIAGNOSIS — N179 Acute kidney failure, unspecified: Secondary | ICD-10-CM | POA: Diagnosis not present

## 2018-02-27 DIAGNOSIS — R410 Disorientation, unspecified: Secondary | ICD-10-CM | POA: Diagnosis not present

## 2018-02-27 DIAGNOSIS — R2681 Unsteadiness on feet: Secondary | ICD-10-CM | POA: Diagnosis not present

## 2018-02-27 DIAGNOSIS — F329 Major depressive disorder, single episode, unspecified: Secondary | ICD-10-CM | POA: Diagnosis not present

## 2018-02-27 DIAGNOSIS — N401 Enlarged prostate with lower urinary tract symptoms: Secondary | ICD-10-CM | POA: Diagnosis not present

## 2018-02-27 DIAGNOSIS — N139 Obstructive and reflux uropathy, unspecified: Secondary | ICD-10-CM | POA: Diagnosis not present

## 2018-02-27 DIAGNOSIS — E875 Hyperkalemia: Secondary | ICD-10-CM | POA: Diagnosis not present

## 2018-02-27 DIAGNOSIS — G14 Postpolio syndrome: Secondary | ICD-10-CM | POA: Diagnosis not present

## 2018-02-27 DIAGNOSIS — M6281 Muscle weakness (generalized): Secondary | ICD-10-CM | POA: Diagnosis not present

## 2018-02-27 DIAGNOSIS — R338 Other retention of urine: Secondary | ICD-10-CM | POA: Diagnosis not present

## 2018-02-27 DIAGNOSIS — Z7401 Bed confinement status: Secondary | ICD-10-CM | POA: Diagnosis not present

## 2018-02-27 DIAGNOSIS — R351 Nocturia: Secondary | ICD-10-CM | POA: Diagnosis not present

## 2018-02-27 DIAGNOSIS — K5900 Constipation, unspecified: Secondary | ICD-10-CM | POA: Diagnosis not present

## 2018-02-27 DIAGNOSIS — C9110 Chronic lymphocytic leukemia of B-cell type not having achieved remission: Secondary | ICD-10-CM | POA: Diagnosis not present

## 2018-02-27 DIAGNOSIS — Z960 Presence of urogenital implants: Secondary | ICD-10-CM | POA: Diagnosis not present

## 2018-02-27 DIAGNOSIS — M2550 Pain in unspecified joint: Secondary | ICD-10-CM | POA: Diagnosis not present

## 2018-02-27 DIAGNOSIS — Z466 Encounter for fitting and adjustment of urinary device: Secondary | ICD-10-CM | POA: Diagnosis not present

## 2018-02-27 DIAGNOSIS — K5901 Slow transit constipation: Secondary | ICD-10-CM | POA: Diagnosis not present

## 2018-02-27 DIAGNOSIS — F419 Anxiety disorder, unspecified: Secondary | ICD-10-CM | POA: Diagnosis not present

## 2018-02-27 DIAGNOSIS — M810 Age-related osteoporosis without current pathological fracture: Secondary | ICD-10-CM | POA: Diagnosis not present

## 2018-02-27 LAB — CBC WITH DIFFERENTIAL/PLATELET
BAND NEUTROPHILS: 0 %
BASOS ABS: 0 10*3/uL (ref 0.0–0.1)
BASOS PCT: 0 %
Blasts: 0 %
EOS ABS: 0.2 10*3/uL (ref 0.0–0.5)
Eosinophils Relative: 1 %
HEMATOCRIT: 41.4 % (ref 39.0–52.0)
Hemoglobin: 12.7 g/dL — ABNORMAL LOW (ref 13.0–17.0)
Lymphocytes Relative: 64 %
Lymphs Abs: 13.9 10*3/uL — ABNORMAL HIGH (ref 0.7–4.0)
MCH: 30.6 pg (ref 26.0–34.0)
MCHC: 30.7 g/dL (ref 30.0–36.0)
MCV: 99.8 fL (ref 80.0–100.0)
METAMYELOCYTES PCT: 0 %
MONO ABS: 0.7 10*3/uL (ref 0.1–1.0)
MONOS PCT: 3 %
Myelocytes: 0 %
NEUTROS ABS: 6.9 10*3/uL (ref 1.7–7.7)
Neutrophils Relative %: 32 %
Other: 0 %
Platelets: 360 10*3/uL (ref 150–400)
Promyelocytes Relative: 0 %
RBC: 4.15 MIL/uL — AB (ref 4.22–5.81)
RDW: 13 % (ref 11.5–15.5)
WBC: 21.7 10*3/uL — AB (ref 4.0–10.5)
nRBC: 0 % (ref 0.0–0.2)
nRBC: 0 /100 WBC

## 2018-02-27 MED ORDER — POLYETHYLENE GLYCOL 3350 17 G PO PACK: 17.0000 g | PACK | Freq: Two times a day (BID) | ORAL | 0 refills | Status: DC

## 2018-02-27 MED ORDER — LORAZEPAM 0.5 MG PO TABS: 0.5000 mg | ORAL_TABLET | Freq: Three times a day (TID) | ORAL | 0 refills | Status: DC | PRN

## 2018-02-27 MED ORDER — TAMSULOSIN HCL 0.4 MG PO CAPS: 0.4000 mg | ORAL_CAPSULE | Freq: Every day | ORAL | Status: DC

## 2018-02-27 NOTE — Clinical Social Work Note (Addendum)
Clinical Social Work Assessment  Patient Details  Name: Manuel Reeves MRN: [id number] Date of Birth: May 09, 1933  Date of referral:  02/24/18               Reason for consult:  Facility Placement                Permission sought to share information with:  Facility Art therapist granted to share information::  Yes, Verbal Permission Granted  Name::     Lambert Jeanty  Agency::  SNF  Relationship::  Spouse  Contact Information:  [phone]  Housing/Transportation Living arrangements for the past 2 months:  [place] of Information:  Patient, Adult Children, Spouse Patient Interpreter Needed:  None Criminal Activity/Legal Involvement Pertinent to Current Situation/Hospitalization:  No - Comment as needed Significant Relationships:  Adult Children, Other Family Members, Spouse Lives with:  Spouse Do you feel safe going back to the place where you live?  Yes Need for family participation in patient care:  Yes (Comment)  Care giving concerns:  Patient lives with spouse at [place]. He is currently +2 assist for transfers to w/c. He will need short term rehab to regain strength before returning to [place].   Social Worker assessment / plan:  CSW spoke with patient, spouse, and multiple family members at bedside to discuss discharge plan. Patient is a resident at [facility] and prefers to go to [facility] SN[facility]. Family states they toured facility yesterday and per prior CSW note, they have been contacted about patient potentially needing SNF placement.  CSW completed FL2 and attempted to contact [place] to coordinate discharge but there was no answer and no voicemail option. Will continue to try to contact [facility] for planned discharge today.  Update 11:28: Contacted [facility]. Spoke with staff member who confirmed patient is able to discharge to facility today. Staff provided fax information and is aware patient will be arriving this  afternoon.   Employment status:  Retired Forensic scientist:  Medicare PT Recommendations:  [place] / Referral to community resources:  [place]  Patient/Family's Response to care:  Family is supportive and involved in patient's care. Family has toured SNF facility and had appropriate questions regarding post d/c f/u with appropriate outpatient specialists.  Patient/Family's Understanding of and Emotional Response to Diagnosis, Current Treatment, and Prognosis:  Patient and family understanding of SNF process and patient's current physical limitations. CSW explained necessary steps before d/c and family reports understanding.   Emotional Assessment Appearance:  Appears stated age Attitude/Demeanor/Rapport:    Affect (typically observed):  Appropriate, Pleasant Orientation:  Oriented to Self, Oriented to Place, Oriented to  Time, Oriented to Situation Alcohol / Substance use:  Not Applicable Psych involvement (Current and /or in the community):  No (Comment)  Discharge Needs  Concerns to be addressed:  Care Coordination Readmission within the last 30 days:  No Current discharge risk:  Physical Impairment Barriers to Discharge:  Other(Waiting for facility confirmation.)   Pricilla Holm, LCSWA 02/27/2018, 11:15 AM

## 2018-02-27 NOTE — Progress Notes (Signed)
Report call to M. Nadara Mustard, RN at [facility].

## 2018-02-27 NOTE — Clinical Social Work Placement (Signed)
Patient discharging to [facility] SNF.  CSW confirmed bed availability and faxed appropriate documents. Patient will transport via [place].  RN call report: [phone] ext [phone]  CLINICAL SOCIAL WORK PLACEMENT  NOTE  Date:  02/27/2018  Patient Details  Name: Manuel Reeves MRN: [id number] Date of Birth: January 13, 1933  Clinical Social Work is seeking post-discharge placement for this patient at the [place] level of care (*CSW will initial, date and re-position this form in  chart as items are completed):      Patient/family provided with [place] Work Department's list of facilities offering this level of care within the geographic area requested by the patient (or if unable, by the patient's family).  Yes   Patient/family informed of their freedom to choose among providers that offer the needed level of care, that participate in Medicare, Medicaid or managed care program needed by the patient, have an available bed and are willing to accept the patient.      Patient/family informed of [facility]'s ownership interest in [facility] and [facility], as well as of the fact that they are under no obligation to receive care at these facilities.  PASRR submitted to EDS on       PASRR number received on       Existing PASRR number confirmed on 02/27/18     FL2 transmitted to all facilities in geographic area requested by pt/family on       FL2 transmitted to all facilities within larger geographic area on       Patient informed that his/her managed care company has contracts with or will negotiate with certain facilities, including the following:            Patient/family informed of bed offers received.  Patient chooses bed at [facility]     Physician recommends and patient chooses bed at      Patient to be transferred to [facility] on 02/27/18.  Patient to be transferred to facility by [facility]     Patient family  notified on 02/27/18 of transfer.  Name of family member notified:  Penelope Phillps, adult children     PHYSICIAN Please prepare priority discharge summary, including medications, Please prepare prescriptions     Additional Comment:    _______________________________________________ Pricilla Holm, [place] 02/27/2018, 12:06 PM

## 2018-02-27 NOTE — NC FL2 (Signed)
[place] LEVEL OF CARE SCREENING TOOL     IDENTIFICATION  Patient Name: Manuel Reeves Birthdate: 1932-08-16 Sex: male Admission Date (Current Location): 02/24/2018  [facility] and [place] Number:  Herbalist and Address:  [facility],  [place] [address] [place]      Provider Number: [id number]  Attending Physician Name and Address:  Shelly Coss, MD  Relative Name and Phone Number:  Edmon Magid: [phone]    Current Level of Care: Hospital Recommended Level of Care: [place] Prior Approval Number:    Date Approved/Denied: 02/24/18 PASRR Number: [id number]  Discharge Plan: SNF    Current Diagnoses: Patient Active Problem List   Diagnosis Date Noted  . Leukocytosis 02/25/2018  . 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 Acute kidney failure ([place]) 02/24/2018  . Constipation 02/24/2018  . Hyperkalemia, diminished renal excretion 02/24/2018  . Delirium 02/24/2018  . Acute bilateral obstructive uropathy 02/24/2018  . CLL (chronic lymphocytic leukemia) ([place]) 02/20/2014    Orientation RESPIRATION BLADDER Height & Weight     Self, Time, Situation, Place  Normal Indwelling catheter Weight:   Height:     BEHAVIORAL SYMPTOMS/MOOD NEUROLOGICAL BOWEL NUTRITION STATUS      Incontinent Diet  AMBULATORY STATUS COMMUNICATION OF NEEDS Skin   Extensive Assist Verbally Normal                       Personal Care Assistance Level of Assistance  Bathing, Feeding, Dressing Bathing Assistance: Maximum assistance Feeding assistance: Limited assistance Dressing Assistance: Maximum assistance     Functional Limitations Info  Sight, Hearing, Speech Sight Info: Adequate Hearing Info: Adequate      SPECIAL CARE FACTORS FREQUENCY  OT (By licensed OT), PT (By licensed PT)     PT Frequency: 5x/week OT Frequency: 5x/week            Contractures Contractures Info: Not present    Additional Factors  Info  Code Status, Allergies, Psychotropic Code Status Info: Full Allergies Info: PENICILLINS  Psychotropic Info: Zoloft         Current Medications (02/27/2018):  This is the current hospital active medication list Current Facility-Administered Medications  Medication Dose Route Frequency Provider Last Rate Last Dose  . acetaminophen (TYLENOL) tablet 650 mg  650 mg Oral Q6H PRN Etta Quill, DO       Or  . acetaminophen (TYLENOL) suppository 650 mg  650 mg Rectal Q6H PRN Etta Quill, DO      . docusate sodium (COLACE) capsule 100 mg  100 mg Oral BID Jennette Kettle M, DO   100 mg at 02/27/18 0848  . feeding supplement (ENSURE ENLIVE) (ENSURE ENLIVE) liquid 237 mL  237 mL Oral BID BM Jennette Kettle M, DO   237 mL at 02/26/18 1537  . Glycerin (Adult) 2.1 g suppository 1 suppository  1 suppository Rectal Once Sofia, Leslie K, PA-C      . heparin injection 5,000 Units  5,000 Units Subcutaneous Q8H Jennette Kettle M, DO   5,000 Units at 02/26/18 1537  . LORazepam (ATIVAN) tablet 0.5 mg  0.5 mg Oral BID Oretha Milch D, MD   0.5 mg at 02/27/18 0847  . ondansetron (ZOFRAN) tablet 4 mg  4 mg Oral Q6H PRN Etta Quill, DO       Or  . ondansetron [facility]) injection 4 mg  4 mg Intravenous Q6H PRN Etta Quill, DO      . polyethylene glycol (  MIRALAX / GLYCOLAX) packet 17 g  17 g Oral Daily Shelly Coss, MD   17 g at 02/27/18 0847  . senna (SENOKOT) tablet 8.6 mg  1 tablet Oral Daily Jennette Kettle M, DO   8.6 mg at 02/27/18 0848  . sertraline (ZOLOFT) tablet 50 mg  50 mg Oral Daily Daleen Bo, MD   50 mg at 02/27/18 0848  . tamsulosin (FLOMAX) capsule 0.4 mg  0.4 mg Oral Daily Shelly Coss, MD   0.4 mg at 02/27/18 0848     Discharge Medications: Please see discharge summary for a list of discharge medications.  Relevant Imaging Results:  Relevant Lab Results:   Additional Information SSN: [id number]  Pricilla Holm, [place]

## 2018-02-27 NOTE — Discharge Summary (Signed)
Physician Discharge Summary  Manuel Reeves VEL:[id number] DOB: 05-25-1932 DOA: 02/24/2018  PCP: Crist Infante, MD  Admit date: 02/24/2018 Discharge date: 02/27/2018  Admitted From: Home Disposition:  SNF  Discharge Condition:Stable CODE STATUS:FULL Diet recommendation:  Regular   Brief/Interim Summary:  Patient is a 84 year old male with past medical history of postpolio syndrome, CLL who presented to the emergency department with complaints of altered mental status for 2 to 3 days, diminished appetite, abdominal pain/back pain and decreased urination.  He was found to have postobstructive AKI. Foley has been placed.  The postobstructive pathology is most likely secondary to BPH.  Tamsulosin started. His acute kidney injury has resolved.  He was also evaluated by physical therapy during his hospitalization and he has been recommended skilled nursing facility .  Patient is stable for discharge to skilled nursing facility today.  He needs to follow-up with urology as an outpatient in a week.  Following problems were addressed during his hospitalization:  Acute kidney injury with hyperkalemia: Most likely postobstuctive.  Resolved with Foley placement.  Foley was discontinued but he was not able to void so it had to be reinserted.  He will follow-up with urology as an outpatient in a week.  Possible  BPH: Acute kidney injury most likely precipitated by obstructive etiology secondary to BPH.  Started on tamsulosin.  Follow-up with urology as an outpatient.  Constipation: Continue bowel regimen.  CLL/Leukocytosis: Most likely from his CLL.  He has a hematologist as an outpatient whom he  can follow-up with.  Depression: Continue home medications.  Disposition/debility: Sons were concerned about increased difficulty in taking care of the patient at home and requesting for skilled nursing facility.  Social worker was consulted.  PT evaluated him and recommended skilled nursing  facility as well.   Discharge Diagnoses:  Principal Problem:   Acute kidney failure (HCC) Active Problems:   CLL (chronic lymphocytic leukemia) (HCC)   Post-polio syndrome   Constipation   Hyperkalemia, diminished renal excretion   Delirium   Acute bilateral obstructive uropathy   Leukocytosis    Discharge Instructions  Discharge Instructions    Diet general   Complete by:  As directed    Discharge instructions   Complete by:  As directed    1) Please take prescribed medications as instructed. 2)Follow up with Urology as an outpatient in a week. 3)Name and number of the provider has been attached.   Increase activity slowly   Complete by:  As directed      Allergies as of 02/27/2018      Reactions   Penicillins Rash   Has patient had a PCN reaction causing immediate rash, facial/tongue/throat swelling, SOB or lightheadedness with hypotension: Y Has patient had a PCN reaction causing severe rash involving mucus membranes or skin necrosis: N Has patient had a PCN reaction that required hospitalization: N Has patient had a PCN reaction occurring within the last 10 years: N If all of the above answers are "NO", then may proceed with Cephalosporin use.         Medication List    TAKE these medications   buPROPion 150 MG 12 hr tablet Commonly known as:  WELLBUTRIN SR Take 1 tablet by mouth Daily.   CALTRATE 600+D PO Take 1 tablet by mouth daily.   LORazepam 0.5 MG tablet Commonly known as:  ATIVAN Take 1 tablet (0.5 mg total) by mouth every 8 (eight) hours as needed for anxiety. What changed:    when to take this  reasons to take this   multivitamin tablet Take 1 tablet by mouth daily.   OCUVITE PO Take 1 tablet by mouth daily.   polyethylene glycol packet Commonly known as:  MIRALAX / GLYCOLAX Take 17 g by mouth 2 (two) times daily.   sertraline 50 MG tablet Commonly known as:  ZOLOFT Take 50 mg by mouth daily.   STOOL SOFTENER PO Take 1 capsule  by mouth daily.   tamsulosin 0.4 MG Caps capsule Commonly known as:  FLOMAX Take 1 capsule (0.4 mg total) by mouth daily. Start taking on:  02/28/2018   VITAMIN D2 PO Take 50,000 Units by mouth every 14 (fourteen) days.      Follow-up Information    Crist Infante, MD In 1 week.   Specialty:  Internal Medicine Why:  To be seen for a checkup Contact information: [place] [address] [phone]        [facility]. Schedule an appointment as soon as possible for a visit in 1 week(s).   Contact information: [place] [phone]         Allergies  Allergen Reactions  . Penicillins Rash    Has patient had a PCN reaction causing immediate rash, facial/tongue/throat swelling, SOB or lightheadedness with hypotension: Y Has patient had a PCN reaction causing severe rash involving mucus membranes or skin necrosis: N Has patient had a PCN reaction that required hospitalization: N Has patient had a PCN reaction occurring within the last 10 years: N If all of the above answers are "NO", then may proceed with Cephalosporin use.       Consultations:  None   Procedures/Studies: Dg Lumbar Spine Complete  Result Date: 02/24/2018 CLINICAL DATA:  Low back pain after a fall. Initial encounter. EXAM: LUMBAR SPINE - COMPLETE 4+ VIEW COMPARISON:  None. FINDINGS: There appears to be transitional lumbosacral anatomy which balloon be considered a partially lumbarized S1 for the purposes of this report. Severe thoracic rotatory dextroscoliosis is partially visualized with compensatory moderate levoconvex lumbar curvature. Within limitations of obliquity created by the scoliosis, no fracture is identified. No listhesis is seen in the sagittal plane from L3-4 inferiorly with assessment of the upper lumbar spine limited by obliquity. Moderately bulky vertebral osteophyte formation is noted at L3-4. There is mild  diffuse gaseous bowel distension throughout the visualized abdomen. IMPRESSION: Severe thoracolumbar scoliosis without acute osseous abnormality identified. Electronically Signed   By: Logan Bores M.D.   On: 02/24/2018 18:14   Dg Abdomen 1 View  Result Date: 02/24/2018 CLINICAL DATA:  Constipation, abdominal distention. EXAM: ABDOMEN - 1 VIEW COMPARISON:  None. FINDINGS: The bowel gas pattern is normal. Phleboliths are noted in the pelvis. Mild amount of stool seen throughout the colon. IMPRESSION: Mild stool burden is noted. No evidence of bowel obstruction or ileus. Electronically Signed   By: Marijo Conception, M.D.   On: 02/24/2018 11:38   Ct Head Wo Contrast  Result Date: 02/16/2018 CLINICAL DATA:  Fall from toilet with head injury. EXAM: CT HEAD WITHOUT CONTRAST TECHNIQUE: Contiguous axial images were obtained from the base of the skull through the vertex without intravenous contrast. COMPARISON:  None. FINDINGS: Brain: No evidence of parenchymal hemorrhage or extra-axial fluid collection. No mass lesion, mass effect, or midline shift. No CT evidence of acute infarction. Nonspecific moderate subcortical and periventricular white matter hypodensity, most in keeping with chronic small vessel ischemic change. Cerebral volume is age appropriate. No ventriculomegaly.  Vascular: No acute abnormality. Skull: No evidence of calvarial fracture. Sinuses/Orbits: The visualized paranasal sinuses are essentially clear. Other:  The mastoid air cells are unopacified. IMPRESSION: 1. No evidence of acute intracranial abnormality. No evidence of calvarial fracture. 2. Moderate chronic small vessel ischemic changes in the cerebral white matter. Electronically Signed   By: Ilona Sorrel M.D.   On: 02/16/2018 15:33       Subjective: Patient seen and examined the bedside this morning.  Feels great.  No new events or concerns.  Stable for discharge to skilled nursing facility today.  Discharge Exam: Vitals:    02/26/18 2152 02/27/18 0418  BP: (!) 145/89 (!) 144/78  Pulse: 94 89  Resp: 18 18  Temp: 98.4 F (36.9 C) 97.7 F (36.5 C)  SpO2: 95% 94%   Vitals:   02/26/18 0543 02/26/18 1442 02/26/18 2152 02/27/18 0418  BP: (!) 148/83 138/68 (!) 145/89 (!) 144/78  Pulse: 86 93 94 89  Resp: 16 16 18 18   Temp: 97.9 F (36.6 C) 97.6 F (36.4 C) 98.4 F (36.9 C) 97.7 F (36.5 C)  TempSrc: Oral Oral Oral Oral  SpO2: 93% 92% 95% 94%    General: Pt is alert, awake, not in acute distress Cardiovascular: RRR, S1/S2 +, no rubs, no gallops Respiratory: CTA bilaterally, no wheezing, no rhonchi Abdominal: Soft, NT, ND, bowel sounds + Extremities: no edema, no cyanosis,paraplegia,contractures    The results of significant diagnostics from this hospitalization (including imaging, microbiology, ancillary and laboratory) are listed below for reference.     Microbiology: Recent Results (from the past 240 hour(s))  Urine culture     Status: None   Collection Time: 02/24/18  5:33 PM  Result Value Ref Range Status   Specimen Description   Final    URINE, RANDOM Performed at [place] [address], [place], [place] [address]    Special Requests   Final    NONE Performed at [facility], [place] [address] [place], [place] [address]    Culture   Final    NO GROWTH Performed at [facility], [place] [address] [place], [place] [address]    Report Status 02/26/2018 FINAL  Final  MRSA PCR Screening     Status: None   Collection Time: 02/25/18  6:33 AM  Result Value Ref Range Status   MRSA by PCR NEGATIVE NEGATIVE Final    Comment:        The GeneXpert MRSA Assay (FDA approved for NASAL specimens only), is one component of a comprehensive MRSA colonization surveillance program. It is not intended to diagnose MRSA infection nor to guide or monitor treatment for MRSA infections. Performed at [facility], [place] [address]  [place],  [address]      Labs: BNP (last 3 results) No results for input(s): BNP in the last 8760 hours. Basic Metabolic Panel: Recent Labs  Lab 02/24/18 1746 02/25/18 0026 02/25/18 0512 02/26/18 0534  NA 137 140 139 137  K 6.6* 5.2* 4.6 4.3  CL 97* 104 103 105  CO2 30 26 26 26   GLUCOSE 121* 111* 101* 100*  BUN 81* 61* 47* 20  CREATININE 3.64* 2.48* 1.68* 0.57*  CALCIUM 9.2 8.4* 8.3* 7.9*  MG 2.6*  --   --   --    Liver Function Tests: Recent Labs  Lab 02/24/18 1746  AST 18  ALT 15  ALKPHOS 91  BILITOT 0.6  PROT 6.9  ALBUMIN 3.3*  No results for input(s): LIPASE, AMYLASE in the last 168 hours. No results for input(s): AMMONIA in the last 168 hours. CBC: Recent Labs  Lab 02/24/18 1746 02/26/18 0534 02/27/18 0526  WBC 28.7* 19.1* 21.7*  NEUTROABS 10.3*  --  6.9  HGB 13.8 12.8* 12.7*  HCT 44.1 41.3 41.4  MCV 99.1 101.5* 99.8  PLT 398 304 360   Cardiac Enzymes: Recent Labs  Lab 02/24/18 1746  CKTOTAL 46*   BNP: Invalid input(s): POCBNP CBG: No results for input(s): GLUCAP in the last 168 hours. D-Dimer No results for input(s): DDIMER in the last 72 hours. Hgb A1c No results for input(s): HGBA1C in the last 72 hours. Lipid Profile No results for input(s): CHOL, HDL, LDLCALC, TRIG, CHOLHDL, LDLDIRECT in the last 72 hours. Thyroid function studies No results for input(s): TSH, T4TOTAL, T3FREE, THYROIDAB in the last 72 hours.  Invalid input(s): FREET3 Anemia work up No results for input(s): VITAMINB12, FOLATE, FERRITIN, TIBC, IRON, RETICCTPCT in the last 72 hours. Urinalysis    Component Value Date/Time   COLORURINE YELLOW 02/24/2018 1733   APPEARANCEUR CLEAR 02/24/2018 1733   LABSPEC 1.013 02/24/2018 1733   PHURINE 6.0 02/24/2018 1733   GLUCOSEU NEGATIVE 02/24/2018 1733   HGBUR SMALL (A) 02/24/2018 1733   BILIRUBINUR NEGATIVE 02/24/2018 1733   KETONESUR NEGATIVE 02/24/2018 1733   PROTEINUR NEGATIVE 02/24/2018 1733   NITRITE NEGATIVE  02/24/2018 1733   LEUKOCYTESUR NEGATIVE 02/24/2018 1733   Sepsis Labs Invalid input(s): PROCALCITONIN,  WBC,  LACTICIDVEN Microbiology Recent Results (from the past 240 hour(s))  Urine culture     Status: None   Collection Time: 02/24/18  5:33 PM  Result Value Ref Range Status   Specimen Description   Final    URINE, RANDOM Performed at [facility], [place] [address] [place], [place] [address]    Special Requests   Final    NONE Performed at [facility], [place] [address], [place], [place] [address]    Culture   Final    NO GROWTH Performed at [facility], [place] [address], [place],  [address]    Report Status 02/26/2018 FINAL  Final  MRSA PCR Screening     Status: None   Collection Time: 02/25/18  6:33 AM  Result Value Ref Range Status   MRSA by PCR NEGATIVE NEGATIVE Final    Comment:        The GeneXpert MRSA Assay (FDA approved for NASAL specimens only), is one component of a comprehensive MRSA colonization surveillance program. It is not intended to diagnose MRSA infection nor to guide or monitor treatment for MRSA infections. Performed at [facility], [place] [address] [place],  [address]     Please note: You were cared for by a hospitalist during your hospital stay. Once you are discharged, your primary care physician will handle any further medical issues. Please note that NO REFILLS for any discharge medications will be authorized once you are discharged, as it is imperative that you return to your primary care physician (or establish a relationship with a primary care physician if you do not have one) for your post hospital discharge needs so that they can reassess your need for medications and monitor your lab values.    Time coordinating discharge: 40 minutes  SIGNED:   Shelly Coss, MD  [facility] 02/27/2018, 11:39 AM Pager [phone]  If 7PM-7AM, please contact  night-coverage [email or web address] Password [email or web address]

## 2018-03-01 ENCOUNTER — Non-Acute Institutional Stay (SKILLED_NURSING_FACILITY): Payer: Medicare Other | Admitting: Family Medicine

## 2018-03-01 ENCOUNTER — Encounter: Payer: Self-pay | Admitting: Family Medicine

## 2018-03-01 DIAGNOSIS — R410 Disorientation, unspecified: Secondary | ICD-10-CM | POA: Diagnosis not present

## 2018-03-01 DIAGNOSIS — N139 Obstructive and reflux uropathy, unspecified: Secondary | ICD-10-CM

## 2018-03-01 DIAGNOSIS — G14 Postpolio syndrome: Secondary | ICD-10-CM

## 2018-03-01 DIAGNOSIS — K5900 Constipation, unspecified: Secondary | ICD-10-CM | POA: Diagnosis not present

## 2018-03-01 DIAGNOSIS — C9110 Chronic lymphocytic leukemia of B-cell type not having achieved remission: Secondary | ICD-10-CM

## 2018-03-01 DIAGNOSIS — N179 Acute kidney failure, unspecified: Secondary | ICD-10-CM | POA: Diagnosis not present

## 2018-03-01 LAB — PATHOLOGIST SMEAR REVIEW

## 2018-03-01 NOTE — Progress Notes (Signed)
Provider:  Alain Honey, MD Location:  [facility] Room Number: 38A Place of Service:  SNF (58)  PCP: Crist Infante, MD Patient Care Team: Crist Infante, MD as PCP - General (Internal Medicine) Heath Lark, MD as Consulting Physician (Hematology and Oncology)  Extended Emergency Contact Information Primary Emergency Contact: Pangborn,Penelope G Address: [address]          [place] [address] [place] Phone: [phone] Relation: Spouse  Code Status: FULL Goals of Care: Advanced Directive information Advanced Directives 03/01/2018  Does Patient Have a Medical Advance Directive? No  Type of Advance Directive -  Does patient want to make changes to medical advance directive? No - Patient declined  Copy of [place] in Chart? -  Would patient like information on creating a medical advance directive? -      Chief Complaint  Patient presents with  . Readmit To SNF    Admit to SNF at [facility]    HPI: Patient is a 84 y.o. male seen today for admission to [facility] with skilled nursing facility.  He had been living independently with his wife he developed some altered mental status and disorientation about 1 week ago apparently he thought he was in [place] and very disoriented.  Wife also noticed diminished appetite and abdominal pain and decreased urination.  He was taken to [facility] long emergency room distractive acute kidney failure.  Foley was placed.  The postobstructive pathology was felt first due to BPH in retrospect have been related to his patient.  Symptoms resolved quickly after he was given enema and bladder drained.  He did undergo head CT which showed no evidence of acute intracranial abnormality. He also has chronic lymphocytic leukemia and is followed by hematology White blood counts on this admission ranged from 19.1-28.7.  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  Family was concerned about difficulty taking care of patient at home requesting to be admitted to the skilled nursing social worker concurred PT evaluated him and recommended skilled nursing.  Discharge instructions were to follow-up with urology for removal of Foley cath [facility] which is new medicine as well as Colace.  He will also receive physical therapy for strengthening with goal to get him back to independent living  Past Medical History:  Diagnosis Date  . Anxiety   . CLL (chronic lymphocytic leukemia) ([place]) 02/20/2014  . Osteoporosis   . Polio    Past Surgical History:  Procedure Laterality Date  . CATARACT EXTRACTION  09/2011   at [facility]  . COLONOSCOPY  03/2007  . RETINAL DETACHMENT SURGERY  09/1995  . SPINAL FUSION  10/1952    reports that he has never smoked. He has never used smokeless tobacco. He reports that he drinks alcohol. He reports that he does not use drugs. Social History   Socioeconomic History  . Marital status: Married    Spouse name: Not on file  . Number of children: Not on file  . Years of education: Not on file  . Highest education level: Not on file  Occupational History  . Not on file  Social Needs  . Financial resource strain: Not on file  . Food insecurity:    Worry: Not on file    Inability: Not on file  . Transportation needs:    Medical: Not on file    Non-medical: Not on file  Tobacco Use  . Smoking status: Never Smoker  . Smokeless tobacco: Never Used  Substance and Sexual Activity  . Alcohol use: Yes    Comment: occasional beer or wine manybe monthly  . Drug use: No  . Sexual activity: Not on file  Lifestyle  . Physical activity:    Days per week: Not on file    Minutes per session: Not on file  . Stress: Not on file  Relationships  . Social connections:    Talks on phone: Not on file    Gets together: Not on file    Attends religious service: Not on file    Active member of club or  organization: Not on file    Attends meetings of clubs or organizations: Not on file    Relationship status: Not on file  . Intimate partner violence:    Fear of current or ex partner: Not on file    Emotionally abused: Not on file    Physically abused: Not on file    Forced sexual activity: Not on file  Other Topics Concern  . Not on file  Social History Narrative  . Not on file    Functional Status Survey:    Family History  Problem Relation Age of Onset  . Colon cancer Sister 17    Health Maintenance  Topic Date Due  . PNA vac Low Risk Adult (1 of 2 - PCV13) 09/25/1999  . COLONOSCOPY  03/23/2017  . INFLUENZA VACCINE  12/09/2017  . TETANUS/TDAP  10/29/2021    Allergies  Allergen Reactions  . Penicillins Rash    Has patient had a PCN reaction causing immediate rash, facial/tongue/throat swelling, SOB or lightheadedness with hypotension: Y Has patient had a PCN reaction causing severe rash involving mucus membranes or skin necrosis: N Has patient had a PCN reaction that required hospitalization: N Has patient had a PCN reaction occurring within the last 10 years: N If all of the above answers are "NO", then may proceed with Cephalosporin use.       Outpatient Encounter Medications as of 03/01/2018  Medication Sig  . buPROPion (WELLBUTRIN SR) 150 MG 12 hr tablet Take 1 tablet by mouth Daily.  . Calcium Carbonate-Vitamin D (CALTRATE 600+D PO) Take 1 tablet by mouth daily.  Marland Kitchen docusate sodium (COLACE) 100 MG capsule Take 100 mg by mouth daily.  . ergocalciferol (VITAMIN D2) 50000 units capsule Take 50,000 Units by mouth every 14 (fourteen) days. On Monday  . LORazepam (ATIVAN) 0.5 MG tablet Take 1 tablet (0.5 mg total) by mouth every 8 (eight) hours as needed for anxiety.  . Multiple Vitamins-Minerals (OCUVITE PO) Take 1 tablet by mouth daily.  . Multiple Vitamins-Minerals (THEREMS-H) TABS Take 1 tablet by mouth daily.  . polyethylene glycol (MIRALAX / GLYCOLAX) packet  Take 17 g by mouth 2 (two) times daily.  . sertraline (ZOLOFT) 50 MG tablet Take 50 mg by mouth daily.  . tamsulosin (FLOMAX) 0.4 MG CAPS capsule Take 1 capsule (0.4 mg total) by mouth daily.  Marland Kitchen zinc oxide 20 % ointment Apply 1 application topically as needed for irritation. To buttocks after every incontinent episode and as needed for redness. May keep at bedside.  . [DISCONTINUED] Docusate Calcium (STOOL SOFTENER PO) Take 1 capsule by mouth daily.  . [DISCONTINUED] Ergocalciferol (VITAMIN D2 PO) Take 50,000 Units by mouth every 14 (fourteen) days.   . [DISCONTINUED] Multiple Vitamin (MULTIVITAMIN) tablet Take 1 tablet by mouth daily.   No facility-administered encounter medications on  file as of 03/01/2018.     Review of Systems  Constitutional: Positive for activity change.  HENT: Negative.   Respiratory: Negative.   Gastrointestinal: Positive for constipation.  Endocrine: Negative.   Genitourinary: Positive for difficulty urinating.  Musculoskeletal: Positive for gait problem.  Allergic/Immunologic: Negative.   Psychiatric/Behavioral: Negative.     Vitals:   03/01/18 1132  BP: 138/65  Pulse: 91  Resp: 20  Temp: (!) 96.8 F (36 C)  TempSrc: Oral  SpO2: 95%  Height: 5\' 11"  (1.803 m)   Body mass index is 27.89 kg/m. Physical Exam  Constitutional: He is oriented to person, place, and time. He appears well-developed.  HENT:  Head: Normocephalic.  Mouth/Throat: Oropharynx is clear and moist.  Eyes: Pupils are equal, round, and reactive to light. Conjunctivae and EOM are normal.  Cardiovascular: Normal rate, regular rhythm and normal heart sounds.  Pulmonary/Chest: Effort normal and breath sounds normal.  Abdominal: Soft. Bowel sounds are normal. He exhibits no distension. There is no tenderness.  Musculoskeletal:  Patient has little use of his lower extremities but able to pivot on right leg according to history  Neurological: He is alert and oriented to person, place,  and time.  Skin: Skin is warm and dry.  Psychiatric: He has a normal mood and affect. His behavior is normal. Judgment and thought content normal.  There are no reported issues regarding cognitive abilities but the fact that he developed acute delirium with constipation and urinary retention due to obstruction release raises suspicion that there may be some underlying issues.  Nursing note and vitals reviewed.   Labs reviewed: Basic Metabolic Panel: Recent Labs    02/24/18 1746 02/25/18 0026 02/25/18 0512 02/26/18 0534  NA 137 140 139 137  K 6.6* 5.2* 4.6 4.3  CL 97* 104 103 105  CO2 30 26 26 26   GLUCOSE 121* 111* 101* 100*  BUN 81* 61* 47* 20  CREATININE 3.64* 2.48* 1.68* 0.57*  CALCIUM 9.2 8.4* 8.3* 7.9*  MG 2.6*  --   --   --    Liver Function Tests: Recent Labs    02/24/18 1746  AST 18  ALT 15  ALKPHOS 91  BILITOT 0.6  PROT 6.9  ALBUMIN 3.3*   No results for input(s): LIPASE, AMYLASE in the last 8760 hours. No results for input(s): AMMONIA in the last 8760 hours. CBC: Recent Labs    02/24/18 1746 02/26/18 0534 02/27/18 0526  WBC 28.7* 19.1* 21.7*  NEUTROABS 10.3*  --  6.9  HGB 13.8 12.8* 12.7*  HCT 44.1 41.3 41.4  MCV 99.1 101.5* 99.8  PLT 398 304 360   Cardiac Enzymes: Recent Labs    02/24/18 1746  CKTOTAL 46*   BNP: Invalid input(s): POCBNP No results found for: HGBA1C No results found for: TSH No results found for: VITAMINB12 No results found for: FOLATE No results found for: IRON, TIBC, FERRITIN  Imaging and Procedures obtained prior to SNF admission: Dg Lumbar Spine Complete  Result Date: 02/24/2018 CLINICAL DATA:  Low back pain after a fall. Initial encounter. EXAM: LUMBAR SPINE - COMPLETE 4+ VIEW COMPARISON:  None. FINDINGS: There appears to be transitional lumbosacral anatomy which balloon be considered a partially lumbarized S1 for the purposes of this report. Severe thoracic rotatory dextroscoliosis is partially visualized with  compensatory moderate levoconvex lumbar curvature. Within limitations of obliquity created by the scoliosis, no fracture is identified. No listhesis is seen in the sagittal plane from L3-4 inferiorly with assessment of the upper lumbar  spine limited by obliquity. Moderately bulky vertebral osteophyte formation is noted at L3-4. There is mild diffuse gaseous bowel distension throughout the visualized abdomen. IMPRESSION: Severe thoracolumbar scoliosis without acute osseous abnormality identified. Electronically Signed   By: Logan Bores M.D.   On: 02/24/2018 18:14   Dg Abdomen 1 View  Result Date: 02/24/2018 CLINICAL DATA:  Constipation, abdominal distention. EXAM: ABDOMEN - 1 VIEW COMPARISON:  None. FINDINGS: The bowel gas pattern is normal. Phleboliths are noted in the pelvis. Mild amount of stool seen throughout the colon. IMPRESSION: Mild stool burden is noted. No evidence of bowel obstruction or ileus. Electronically Signed   By: Marijo Conception, M.D.   On: 02/24/2018 11:38    Assessment/Plan 1. Post-polio syndrome Patient has minimal use of lower extremities.  Uses electric chair for some support and  2. Delirium Acute confusion and delirium resolved after constipation and urinary obstruction relieved patient is back to baseline with no residual sequelae  3. Acute renal failure, unspecified acute renal failure type ([place]) Creatinine rose to 3.64 the time of admission but as the obstruction resolved creatinine normalized to 0.57.  BUN also normalized from 81 back to 20.  Patient will be encouraged to continue fluid intake  4. Acute bilateral obstructive uropathy This resolved with Foley cath.  He will follow-up with urology later this week  5. CLL (chronic lymphocytic leukemia) ([facility]) White count at the time of discharge is 18.7 with high of 28.7 these results will be made available to his oncologist  6. Constipation, unspecified constipation type Bowels arm now moving normally.  He will  continue on with MiraLAX and Colace   Family/ staff Communication: Results communicated to wife and staff  Labs/tests ordered:  Lillette Boxer. Sabra Heck, [place] [address] [place], [place] Office [phone]

## 2018-03-04 DIAGNOSIS — R351 Nocturia: Secondary | ICD-10-CM | POA: Diagnosis not present

## 2018-03-04 DIAGNOSIS — N401 Enlarged prostate with lower urinary tract symptoms: Secondary | ICD-10-CM | POA: Diagnosis not present

## 2018-03-04 DIAGNOSIS — R338 Other retention of urine: Secondary | ICD-10-CM | POA: Diagnosis not present

## 2018-03-08 ENCOUNTER — Inpatient Hospital Stay: Payer: Medicare Other | Admitting: Hematology and Oncology

## 2018-03-15 DIAGNOSIS — R338 Other retention of urine: Secondary | ICD-10-CM | POA: Diagnosis not present

## 2018-03-29 DIAGNOSIS — R338 Other retention of urine: Secondary | ICD-10-CM | POA: Diagnosis not present

## 2018-03-31 ENCOUNTER — Non-Acute Institutional Stay (SKILLED_NURSING_FACILITY): Payer: Medicare Other | Admitting: Family

## 2018-03-31 ENCOUNTER — Encounter: Payer: Self-pay | Admitting: Family

## 2018-03-31 DIAGNOSIS — F329 Major depressive disorder, single episode, unspecified: Secondary | ICD-10-CM | POA: Diagnosis not present

## 2018-03-31 DIAGNOSIS — Z960 Presence of urogenital implants: Secondary | ICD-10-CM | POA: Diagnosis not present

## 2018-03-31 DIAGNOSIS — K5901 Slow transit constipation: Secondary | ICD-10-CM

## 2018-03-31 DIAGNOSIS — C9110 Chronic lymphocytic leukemia of B-cell type not having achieved remission: Secondary | ICD-10-CM

## 2018-03-31 DIAGNOSIS — D72829 Elevated white blood cell count, unspecified: Secondary | ICD-10-CM

## 2018-03-31 DIAGNOSIS — G14 Postpolio syndrome: Secondary | ICD-10-CM | POA: Diagnosis not present

## 2018-03-31 DIAGNOSIS — Z978 Presence of other specified devices: Secondary | ICD-10-CM

## 2018-03-31 MED ORDER — POLYETHYLENE GLYCOL 3350 17 G PO PACK: 17.0000 g | PACK | Freq: Every day | ORAL | 0 refills | Status: DC | PRN

## 2018-03-31 NOTE — Progress Notes (Signed)
Location:  [facility] Room Number: 38A Place of Service:  SNF (31) Provider: Dinah Ngetich FNP-C   Crist Infante, MD  Patient Care Team: Crist Infante, MD as PCP - General (Internal Medicine) Heath Lark, MD as Consulting Physician (Hematology and Oncology)  Extended Emergency Contact Information Primary Emergency Contact: Smyre,Penelope G Address: [address]          [place] [address] [place] Phone: [phone] Relation: Spouse  Code Status: Full code   Goals of care: Advanced Directive information Advanced Directives 03/31/2018  Does Patient Have a Medical Advance Directive? Yes  Type of Advance Directive [place]  Does patient want to make changes to medical advance directive? No - Patient declined  Copy of [place] in Chart? Yes - validated most recent copy scanned in chart (See row information)  Would patient like information on creating a medical advance directive? -     Chief Complaint  Patient presents with  . Medical Management of Chronic Issues    Routine Visit  . Health Maintenance    Flu and Pna Vaccine    HPI:  Pt is a 84 y.o. male seen today [place] for medical management of chronic diseases.He has a medical history of post polio syndrome,chronic lymphocytic leukemia,Depression,anxiety among other conditions.He is seen in his room today with facility Nurse present at bedside.He denies any acute issues.He was recently seen by Urology 03/15/2018 failed.Foley catheter was replaced and one time dose of Cipro 500 mg tablet was given.Also recommended to continue use of Neosporin on urethral meatus as needed basis.patient to follow up in 2-4 weeks. He continues to work with PT/OT 5 X per week with goal to get back to his Independent apartment.His weight log reviewed shows progressive weight gain: 178.6 lbs (03/09/2018); 178.6 lbs (03/11/2018);182 lbs (03/16/2018); 185.2 lbs  (03/23/2018).He states has a good appetite.No fall episodes reported. He would like his miralax changed to as needed basis states has had multiple stool per day.   Past Medical History:  Diagnosis Date  . Anxiety   . CLL (chronic lymphocytic leukemia) ([place]) 02/20/2014  . Osteoporosis   . Polio    Past Surgical History:  Procedure Laterality Date  . CATARACT EXTRACTION  09/2011   at [facility]  . COLONOSCOPY  03/2007  . RETINAL DETACHMENT SURGERY  09/1995  . SPINAL FUSION  10/1952    Allergies  Allergen Reactions  . Penicillins Rash    Has patient had a PCN reaction causing immediate rash, facial/tongue/throat swelling, SOB or lightheadedness with hypotension: Y Has patient had a PCN reaction causing severe rash involving mucus membranes or skin necrosis: N Has patient had a PCN reaction that required hospitalization: N Has patient had a PCN reaction occurring within the last 10 years: N If all of the above answers are "NO", then may proceed with Cephalosporin use.       Allergies as of 03/31/2018      Reactions   Penicillins Rash   Has patient had a PCN reaction causing immediate rash, facial/tongue/throat swelling, SOB or lightheadedness with hypotension: Y Has patient had a PCN reaction causing severe rash involving mucus membranes or skin necrosis: N Has patient had a PCN reaction that required hospitalization: N Has patient had a PCN reaction occurring within the last 10 years: N If all of the above answers are "NO", then may proceed with Cephalosporin use.         Medication  List        Accurate as of 03/31/18  2:39 PM. Always use your most recent med list.          buPROPion 150 MG 12 hr tablet Commonly known as:  WELLBUTRIN SR Take 1 tablet by mouth Daily.   CALTRATE 600+D PO Take 1 tablet by mouth daily.   docusate sodium 100 MG capsule Commonly known as:  COLACE Take 100 mg by mouth daily.   ergocalciferol 1.25 MG (50000 UT) capsule Commonly known  as:  VITAMIN D2 Take 50,000 Units by mouth every 14 (fourteen) days. On Monday   LORazepam 0.5 MG tablet Commonly known as:  ATIVAN Take 1 tablet (0.5 mg total) by mouth every 8 (eight) hours as needed for anxiety.   THEREMS-H Tabs Take 1 tablet by mouth daily.   OCUVITE PO Take 1 tablet by mouth daily.   polyethylene glycol packet Commonly known as:  MIRALAX / GLYCOLAX Take 17 g by mouth 2 (two) times daily.   sertraline 50 MG tablet Commonly known as:  ZOLOFT Take 50 mg by mouth daily.   tamsulosin 0.4 MG Caps capsule Commonly known as:  FLOMAX Take 1 capsule (0.4 mg total) by mouth daily.   zinc oxide 20 % ointment Apply 1 application topically as needed for irritation. To buttocks after every incontinent episode and as needed for redness. May keep at bedside.       Review of Systems  Constitutional: Negative for appetite change, chills, fatigue and fever.       Progressive weight gain.  HENT: Negative for congestion, rhinorrhea, sinus pressure, sinus pain, sneezing and sore throat.   Eyes: Negative for discharge, redness, itching and visual disturbance.  Respiratory: Negative for cough, chest tightness, shortness of breath and wheezing.   Cardiovascular: Negative for chest pain, palpitations and leg swelling.  Gastrointestinal: Negative for abdominal distention, abdominal pain, constipation, diarrhea, nausea and vomiting.       Has been refusing to take his miralax   Endocrine: Negative for cold intolerance, heat intolerance, polydipsia, polyphagia and polyuria.  Genitourinary: Negative for flank pain and urgency.       Indwelling foley catheter   Musculoskeletal:       Bed bound/power wheel chair.Wears leg brace off during visit.   Skin: Negative for color change, pallor, rash and wound.       Foam dressing to left malleolus and skin prep to heels for protection.  Neurological: Positive for weakness. Negative for dizziness, light-headedness and headaches.       Hx  polio   Hematological: Does not bruise/bleed easily.  Psychiatric/Behavioral: Negative for agitation, confusion and sleep disturbance. The patient is not nervous/anxious.     Immunization History  Administered Date(s) Administered  . Influenza-Unspecified 01/14/2016, 02/10/2017  . Tdap 10/30/2011   Pertinent  Health Maintenance Due  Topic Date Due  . PNA vac Low Risk Adult (1 of 2 - PCV13) 09/25/1999  . COLONOSCOPY  03/23/2017  . INFLUENZA VACCINE  12/09/2017   Fall Risk  05/07/2014 02/20/2014  Falls in the past year? Yes Yes  Number falls in past yr: 2 or more 2 or more  Risk Factor Category  High Fall Risk -  Risk for fall due to : - Impaired balance/gait    Vitals:   03/31/18 1026  BP: 102/63  Pulse: 87  Resp: 20  Temp: 97.9 F (36.6 C)  TempSrc: Oral  SpO2: 97%  Weight: 185 lb 3.2 oz (84 kg)  Height: 5'  11" (1.803 m)   Body mass index is 25.83 kg/m. Physical Exam  Constitutional: He is oriented to person, place, and time.  Overweight elderly in no acute distress   HENT:  Head: Normocephalic.  Right Ear: External ear normal.  Left Ear: External ear normal.  Mouth/Throat: Oropharynx is clear and moist. No oropharyngeal exudate.  Eyes: Pupils are equal, round, and reactive to light. Conjunctivae and EOM are normal. Right eye exhibits no discharge. Left eye exhibits no discharge. No scleral icterus.  Neck: Normal range of motion. No JVD present. No thyromegaly present.  Cardiovascular: Normal rate, regular rhythm, normal heart sounds and intact distal pulses. Exam reveals no gallop and no friction rub.  No murmur heard. Pulmonary/Chest: Effort normal and breath sounds normal. No respiratory distress. He has no wheezes. He has no rales.  Abdominal: Soft. Bowel sounds are normal. He exhibits no distension and no mass. There is no tenderness. There is no rebound and no guarding.  Genitourinary:  Genitourinary Comments: Indwelling foley catheter draining clear yellow  urine   Musculoskeletal: He exhibits no edema or tenderness.  Bilateral lower extremities weakness requires assistance with turning and transfer.Moves UE's   Lymphadenopathy:    He has no cervical adenopathy.  Neurological: He is oriented to person, place, and time. Gait abnormal.  Skin: Skin is warm and dry. No rash noted. No erythema. No pallor.  Right malleolus previous ulcer resolved.foam dressing in place for protections.   Psychiatric: He has a normal mood and affect. His speech is normal and behavior is normal. Judgment and thought content normal.  Nursing note and vitals reviewed.   Labs reviewed: Recent Labs    02/24/18 1746 02/25/18 0026 02/25/18 0512 02/26/18 0534  NA 137 140 139 137  K 6.6* 5.2* 4.6 4.3  CL 97* 104 103 105  CO2 30 26 26 26   GLUCOSE 121* 111* 101* 100*  BUN 81* 61* 47* 20  CREATININE 3.64* 2.48* 1.68* 0.57*  CALCIUM 9.2 8.4* 8.3* 7.9*  MG 2.6*  --   --   --    Recent Labs    02/24/18 1746  AST 18  ALT 15  ALKPHOS 91  BILITOT 0.6  PROT 6.9  ALBUMIN 3.3*   Recent Labs    02/24/18 1746 02/26/18 0534 02/27/18 0526  WBC 28.7* 19.1* 21.7*  NEUTROABS 10.3*  --  6.9  HGB 13.8 12.8* 12.7*  HCT 44.1 41.3 41.4  MCV 99.1 101.5* 99.8  PLT 398 304 360    Significant Diagnostic Results in last 30 days:  No results found.  Assessment/Plan 1. Post-polio syndrome Requires total care assistance and transfer.continue with power wheelchair.Skin care.continue with PT/OT for strengthening.    2. CLL (chronic lymphocytic leukemia) ([place]) Continue to follow up with hematologist.WBC elevated during hospital admission.Will recheck CBC/diff and BMP 04/04/2018.   3. Slow transit constipation Reports multiple stool per day.change Miralax 17 Gm powder to once daily as needed.continue on colace 100 mg capsule daily.will consider adding senna given history of polio.continue to encourage oral intake and hydration.   4. Leukocytosis, unspecified type WBC 28.7  (02/24/2018);WBC 19.1 (02/26/2018);WBC 21.7 (02/27/2018). Afebrile.Recheck CBC/diff 04/04/2018.   5. Major depression, chronic Mood stable.continue on Ativan 0.5mg  tablet every 8 hours as needed,sertraline 50 mg tablet daily and Wellbutrin SR 150 mg tablet daily. Check TSH level 04/04/2018.   6. Presence of indwelling Foley catheter Afebrile.foley catheter patent draining clear yellow urine.seen by Urology 03/15/2018 failed voiding trial.Has a follow up appointment with Urology.continue to monitor.  Facility Nurse to train family on foley care for discharge planning to independent living.  Family/ staff Communication: Reviewed plan of care with patient and facility Nurse.  Labs/tests ordered: CBC/diff,BMP,TSH level 04/04/2018.   Sandrea Hughs, NP

## 2018-04-05 LAB — CBC AND DIFFERENTIAL
HCT: 40 — AB (ref 41–53)
Hemoglobin: 13.6 (ref 13.5–17.5)
Platelets: 328 (ref 150–399)
WBC: 15.2

## 2018-04-05 LAB — BASIC METABOLIC PANEL
BUN: 12 (ref 4–21)
CREATININE: 0.5 — AB (ref 0.6–1.3)
Glucose: 85
POTASSIUM: 5.9 — AB (ref 3.4–5.3)
Sodium: 142 (ref 137–147)

## 2018-04-05 LAB — TSH: TSH: 2.41 (ref 0.41–5.90)

## 2018-04-11 ENCOUNTER — Non-Acute Institutional Stay (SKILLED_NURSING_FACILITY): Payer: Medicare Other | Admitting: Family

## 2018-04-11 ENCOUNTER — Encounter: Payer: Self-pay | Admitting: Family

## 2018-04-11 DIAGNOSIS — K5901 Slow transit constipation: Secondary | ICD-10-CM

## 2018-04-11 DIAGNOSIS — R2681 Unsteadiness on feet: Secondary | ICD-10-CM | POA: Diagnosis not present

## 2018-04-11 DIAGNOSIS — Z978 Presence of other specified devices: Secondary | ICD-10-CM

## 2018-04-11 DIAGNOSIS — G14 Postpolio syndrome: Secondary | ICD-10-CM | POA: Diagnosis not present

## 2018-04-11 DIAGNOSIS — F329 Major depressive disorder, single episode, unspecified: Secondary | ICD-10-CM | POA: Diagnosis not present

## 2018-04-11 DIAGNOSIS — C9110 Chronic lymphocytic leukemia of B-cell type not having achieved remission: Secondary | ICD-10-CM

## 2018-04-11 DIAGNOSIS — Z960 Presence of urogenital implants: Secondary | ICD-10-CM

## 2018-04-11 NOTE — Progress Notes (Addendum)
Location:  [facility] Room Number: 38A Place of Service:  SNF (31)  Provider: Marlowe Sax FNP-C   PCP: Crist Infante, MD Patient Care Team: Crist Infante, MD as PCP - General (Internal Medicine) Heath Lark, MD as Consulting Physician (Hematology and Oncology)  Extended Emergency Contact Information Primary Emergency Contact: Waring,Penelope G Address: [address]          [place] [address] [place] Phone: [phone] Relation: Spouse  Code Status: Full Code  Goals of care:  Advanced Directive information Advanced Directives 04/11/2018  Does Patient Have a Medical Advance Directive? Yes  Type of Advance Directive [place]  Does patient want to make changes to medical advance directive? No - Patient declined  Copy of [place] in Chart? Yes - validated most recent copy scanned in chart (See row information)  Would patient like information on creating a medical advance directive? -     Allergies  Allergen Reactions  . Penicillins Rash    Has patient had a PCN reaction causing immediate rash, facial/tongue/throat swelling, SOB or lightheadedness with hypotension: Y Has patient had a PCN reaction causing severe rash involving mucus membranes or skin necrosis: N Has patient had a PCN reaction that required hospitalization: N Has patient had a PCN reaction occurring within the last 10 years: N If all of the above answers are "NO", then may proceed with Cephalosporin use.       Chief Complaint  Patient presents with  . Discharge Note    Discharge from [facility] to Independent Living 04/12/2018    HPI:  84 y.o. male seen today at [facility] for discharge back to independent Living apartment.He was here for short term rehabilitation for post hospital admission from 02/24/2018 -02/27/2018 for altered mental status,abdominal/back pain and decreased urination.He also had  postobstructive acute Kidney injury.Foley cathter was placed.Tamsulosin was initiated it was thought his obstruction was due to BPH.IVF was given.AKI resolved.Foley catheter was discontinued but was not able to void so foley catheter was reinserted.He was also given an enema due to constipation with much improvement.sennaTablet was added.His WBC were 28 thought possible due to his CLL.He remained stable but family requested skilled Nursing facility.He was evaluated by Physical Therapy who recommended skilled rehab.Outpatient urology was recommended.He has a medical history of postpolio syndrome,Depression,CLL among other conditions.He is seen in his room today  He has had unremarkable stay here in rehab.He has worked well with PT/OT now stable for discharge back to [place].He was seen by urologist Dr.Otellin 03/29/2018 failed voiding trial therefore foley catheter was reinserted.He has another follow up appointment with Urologist 04/12/2018 prior to discharge.Patient has been trained by facility Nurse on emptying and caring for the foley catheter.His Miralax was changed from twice daily to as needed due to complains of loose stool.He denies any acute issues during visit.   He will be discharged to [place] with PT/OT to continue with ROM, Exercise, Gait stability and muscle strengthening.He does not require any new DME has own power wheelchair.Discharged process will be arranged by facility social worker prior to discharge.He will discharge with his Prescription medication then patient to follow up with PCP in 1-2 weeks.He states has an appointment with PCP Dr.Perini 04/15/2018.He will also continue to follow up with Hematologist for CLL. Facility staff report no new concerns.   Past Medical History:  Diagnosis Date  . Anxiety   . CLL (chronic lymphocytic leukemia) ([place]) 02/20/2014  . Osteoporosis   .  Polio     Past Surgical History:  Procedure Laterality Date  . CATARACT EXTRACTION  09/2011   at [facility]  .  COLONOSCOPY  03/2007  . RETINAL DETACHMENT SURGERY  09/1995  . SPINAL FUSION  10/1952      reports that he has never smoked. He has never used smokeless tobacco. He reports that he drinks alcohol. He reports that he does not use drugs. Social History   Socioeconomic History  . Marital status: Married    Spouse name: Not on file  . Number of children: Not on file  . Years of education: Not on file  . Highest education level: Not on file  Occupational History  . Not on file  Social Needs  . Financial resource strain: Not on file  . Food insecurity:    Worry: Not on file    Inability: Not on file  . Transportation needs:    Medical: Not on file    Non-medical: Not on file  Tobacco Use  . Smoking status: Never Smoker  . Smokeless tobacco: Never Used  Substance and Sexual Activity  . Alcohol use: Yes    Comment: occasional beer or wine manybe monthly  . Drug use: No  . Sexual activity: Not on file  Lifestyle  . Physical activity:    Days per week: Not on file    Minutes per session: Not on file  . Stress: Not on file  Relationships  . Social connections:    Talks on phone: Not on file    Gets together: Not on file    Attends religious service: Not on file    Active member of club or organization: Not on file    Attends meetings of clubs or organizations: Not on file    Relationship status: Not on file  . Intimate partner violence:    Fear of current or ex partner: Not on file    Emotionally abused: Not on file    Physically abused: Not on file    Forced sexual activity: Not on file  Other Topics Concern  . Not on file  Social History Narrative  . Not on file   Functional Status Survey:    Allergies  Allergen Reactions  . Penicillins Rash    Has patient had a PCN reaction causing immediate rash, facial/tongue/throat swelling, SOB or lightheadedness with hypotension: Y Has patient had a PCN reaction causing severe rash involving mucus membranes or skin necrosis:  N Has patient had a PCN reaction that required hospitalization: N Has patient had a PCN reaction occurring within the last 10 years: N If all of the above answers are "NO", then may proceed with Cephalosporin use.       Pertinent  Health Maintenance Due  Topic Date Due  . PNA vac Low Risk Adult (1 of 2 - PCV13) 09/25/1999  . COLONOSCOPY  03/23/2017  . INFLUENZA VACCINE  Completed    Medications: Outpatient Encounter Medications as of 04/11/2018  Medication Sig  . buPROPion (WELLBUTRIN SR) 150 MG 12 hr tablet Take 1 tablet by mouth Daily.  . Calcium Carbonate-Vitamin D (CALTRATE 600+D PO) Take 1 tablet by mouth daily.  Marland Kitchen docusate sodium (COLACE) 100 MG capsule Take 100 mg by mouth daily.  . ergocalciferol (VITAMIN D2) 50000 units capsule Take 50,000 Units by mouth every 14 (fourteen) days. On Monday  . Multiple Vitamins-Minerals (OCUVITE PO) Take 1 tablet by mouth daily.  . Multiple Vitamins-Minerals (THEREMS-H) TABS Take 1 tablet by mouth  daily.  . polyethylene glycol (MIRALAX / GLYCOLAX) packet Take 17 g by mouth daily as needed.  . sertraline (ZOLOFT) 50 MG tablet Take 50 mg by mouth daily.  . tamsulosin (FLOMAX) 0.4 MG CAPS capsule Take 1 capsule (0.4 mg total) by mouth daily.  Marland Kitchen zinc oxide 20 % ointment Apply 1 application topically as needed for irritation. To buttocks after every incontinent episode and as needed for redness. May keep at bedside.  . [DISCONTINUED] LORazepam (ATIVAN) 0.5 MG tablet Take 1 tablet (0.5 mg total) by mouth every 8 (eight) hours as needed for anxiety.   No facility-administered encounter medications on file as of 04/11/2018.      Review of Systems  Constitutional: Negative for appetite change, chills, fatigue, fever and unexpected weight change.  HENT: Negative for congestion, rhinorrhea, sinus pressure, sinus pain, sneezing and sore throat.   Eyes: Negative for pain, discharge, redness and itching.  Respiratory: Negative for cough, chest tightness,  shortness of breath and wheezing.   Cardiovascular: Negative for chest pain, palpitations and leg swelling.  Gastrointestinal: Negative for abdominal distention, abdominal pain, constipation, diarrhea, nausea and vomiting.  Endocrine: Negative for cold intolerance, heat intolerance, polydipsia, polyphagia and polyuria.  Genitourinary: Negative for flank pain, hematuria and urgency.       Indwelling foley catheter   Musculoskeletal: Positive for gait problem.       No recent fall episode   Skin: Negative for color change, pallor, rash and wound.  Neurological: Negative for dizziness, light-headedness and headaches.       Hx polio syndrome  Hematological: Does not bruise/bleed easily.  Psychiatric/Behavioral: Negative for agitation, confusion and sleep disturbance. The patient is not nervous/anxious.     Vitals:   04/11/18 1120  BP: 117/69  Pulse: 83  Resp: 20  Temp: (!) 97.1 F (36.2 C)  TempSrc: Oral  SpO2: 90%  Weight: 185 lb 3.2 oz (84 kg)  Height: 5\' 11"  (1.803 m)   Body mass index is 25.83 kg/m. Physical Exam  Constitutional: He is oriented to person, place, and time and well-developed, well-nourished, and in no distress. No distress.  HENT:  Head: Normocephalic.  Right Ear: External ear normal.  Left Ear: External ear normal.  Mouth/Throat: Oropharynx is clear and moist. No oropharyngeal exudate.  Eyes: Pupils are equal, round, and reactive to light. Conjunctivae and EOM are normal. Right eye exhibits no discharge. Left eye exhibits no discharge. No scleral icterus.  Neck: Normal range of motion. No JVD present. No thyromegaly present.  Cardiovascular: Normal rate, regular rhythm, normal heart sounds and intact distal pulses. Exam reveals no gallop and no friction rub.  No murmur heard. Pulmonary/Chest: Effort normal and breath sounds normal. No respiratory distress. He has no wheezes. He has no rales.  Abdominal: Soft. Bowel sounds are normal. He exhibits no distension  and no mass. There is no tenderness. There is no rebound and no guarding.  Genitourinary:  Genitourinary Comments: Indwelling foley catheter draining clear yellow urine   Musculoskeletal: He exhibits no edema or tenderness.   Moves UE's has  Bilateral lower extremities decreased strength transfer to power wheelchair  by pivoting on right leg.Wears leg brace.  Lymphadenopathy:    He has no cervical adenopathy.  Neurological: He is oriented to person, place, and time.  Abnormal gait  Skin: Skin is warm and dry. No rash noted. No erythema. No pallor.  Skin intact.Protective foam dressing in place on right malleolus area.  Psychiatric: Mood, memory, affect and judgment normal.  Labs reviewed: Basic Metabolic Panel: Recent Labs    02/24/18 1746 02/25/18 0026 02/25/18 0512 02/26/18 0534 04/05/18  NA 137 140 139 137 142  K 6.6* 5.2* 4.6 4.3 5.9*  CL 97* 104 103 105  --   CO2 30 26 26 26   --   GLUCOSE 121* 111* 101* 100*  --   BUN 81* 61* 47* 20 12  CREATININE 3.64* 2.48* 1.68* 0.57* 0.5*  CALCIUM 9.2 8.4* 8.3* 7.9*  --   MG 2.6*  --   --   --   --    Liver Function Tests: Recent Labs    02/24/18 1746  AST 18  ALT 15  ALKPHOS 91  BILITOT 0.6  PROT 6.9  ALBUMIN 3.3*   CBC: Recent Labs    02/24/18 1746 02/26/18 0534 02/27/18 0526 04/05/18  WBC 28.7* 19.1* 21.7* 15.2  NEUTROABS 10.3*  --  6.9  --   HGB 13.8 12.8* 12.7* 13.6  HCT 44.1 41.3 41.4 40*  MCV 99.1 101.5* 99.8  --   PLT 398 304 360 328   Cardiac Enzymes: Recent Labs    02/24/18 1746  CKTOTAL 46*    Procedures and Imaging Studies During Stay: No results found.  Assessment/Plan:   1. Unsteady gait Has worked well with PT/ OT.Will discharge to [place] with  PT/OT to continue with ROM, Exercise and muscle strengthening.No new  DME required has power wheelchair.Fall and safety precautions.   2. Post-polio syndrome Uses power wheelchair.discharge with PT/OT as above.  3. CLL (chronic lymphocytic  leukemia) ([facility]) Afebrile.WBC trending down 28.7 (02/24/2018);21.7 (02/27/2018) and 15.2 ( 04/05/2018). PCP to recheck CBC,BMP in 1-2 weeks.continue to follow up with hematologist.   4. Major depression, chronic Mood stable.continue on sertraline 50 mg tablet daily,Wellbutrin SR 150 mg tablet daily and Ativan 0.5 mg tablet every 8 hours as needed. Continue to monitor for mood changes.    5. Slow transit constipation Current regimen effective.continue on colace and Miralax as needed.   6. Presence of indwelling Foley catheter Foley catheter patent.Failed voiding trial 03/29/2018 with urologist.Has upcoming appointment with Urologist 04/12/2018 prior to discharge.   Patient is being discharged with the following home health services:   -PT/OT for ROM, exercise and muscle strengthening  Patient is being discharged with the following durable medical equipment:  - No new DME required has own power wheelchair.   Patient has been advised to f/u with their PCP in 1-2 weeks to for a transitions of care visit.Social services at their facility was responsible for arranging this appointment.  Pt was provided with adequate prescriptions of noncontrolled medications to reach the scheduled appointment.For controlled substances, a limited supply was provided as appropriate for the individual patient. If the pt normally receives these medications from a pain clinic or has a contract with another physician, these medications should be received from that clinic or physician only).    Future labs/tests needed:  CBC, BMP in 1-2 weeks PCP   Addendum: 04/19/2018  Notified by comfort keep agency Nurse that patient's requires extensive assistance with transfers.Request new script for PT/OT.hard script written and given to Therapy.

## 2018-04-12 DIAGNOSIS — R338 Other retention of urine: Secondary | ICD-10-CM | POA: Diagnosis not present

## 2018-04-15 DIAGNOSIS — R339 Retention of urine, unspecified: Secondary | ICD-10-CM | POA: Diagnosis not present

## 2018-04-15 DIAGNOSIS — K5909 Other constipation: Secondary | ICD-10-CM | POA: Diagnosis not present

## 2018-04-15 DIAGNOSIS — F3289 Other specified depressive episodes: Secondary | ICD-10-CM | POA: Diagnosis not present

## 2018-04-15 DIAGNOSIS — N401 Enlarged prostate with lower urinary tract symptoms: Secondary | ICD-10-CM | POA: Diagnosis not present

## 2018-04-15 DIAGNOSIS — G14 Postpolio syndrome: Secondary | ICD-10-CM | POA: Diagnosis not present

## 2018-04-15 DIAGNOSIS — C9100 Acute lymphoblastic leukemia not having achieved remission: Secondary | ICD-10-CM | POA: Diagnosis not present

## 2018-04-20 ENCOUNTER — Other Ambulatory Visit: Payer: Self-pay | Admitting: Urology

## 2018-04-21 ENCOUNTER — Encounter (HOSPITAL_COMMUNITY): Payer: Self-pay | Admitting: Emergency Medicine

## 2018-04-21 ENCOUNTER — Emergency Department (HOSPITAL_COMMUNITY): Payer: Medicare Other

## 2018-04-21 ENCOUNTER — Inpatient Hospital Stay (HOSPITAL_COMMUNITY)
Admission: EM | Admit: 2018-04-21 | Discharge: 2018-04-24 | DRG: 177 | Disposition: A | Discharge status: Home / Self Care | Payer: Medicare Other | Attending: Internal Medicine | Admitting: Internal Medicine

## 2018-04-21 DIAGNOSIS — R0603 Acute respiratory distress: Secondary | ICD-10-CM | POA: Diagnosis not present

## 2018-04-21 DIAGNOSIS — Z79899 Other long term (current) drug therapy: Secondary | ICD-10-CM

## 2018-04-21 DIAGNOSIS — R338 Other retention of urine: Secondary | ICD-10-CM | POA: Diagnosis present

## 2018-04-21 DIAGNOSIS — F419 Anxiety disorder, unspecified: Secondary | ICD-10-CM | POA: Diagnosis present

## 2018-04-21 DIAGNOSIS — R059 Cough, unspecified: Secondary | ICD-10-CM | POA: Diagnosis present

## 2018-04-21 DIAGNOSIS — G14 Postpolio syndrome: Secondary | ICD-10-CM | POA: Diagnosis present

## 2018-04-21 DIAGNOSIS — G8250 Quadriplegia, unspecified: Secondary | ICD-10-CM | POA: Diagnosis present

## 2018-04-21 DIAGNOSIS — N400 Enlarged prostate without lower urinary tract symptoms: Secondary | ICD-10-CM | POA: Diagnosis present

## 2018-04-21 DIAGNOSIS — M6281 Muscle weakness (generalized): Secondary | ICD-10-CM | POA: Diagnosis not present

## 2018-04-21 DIAGNOSIS — Z880 Allergy status to penicillin: Secondary | ICD-10-CM

## 2018-04-21 DIAGNOSIS — R05 Cough: Secondary | ICD-10-CM

## 2018-04-21 DIAGNOSIS — F418 Other specified anxiety disorders: Secondary | ICD-10-CM | POA: Diagnosis present

## 2018-04-21 DIAGNOSIS — C9110 Chronic lymphocytic leukemia of B-cell type not having achieved remission: Secondary | ICD-10-CM | POA: Diagnosis present

## 2018-04-21 DIAGNOSIS — J181 Lobar pneumonia, unspecified organism: Secondary | ICD-10-CM

## 2018-04-21 DIAGNOSIS — F329 Major depressive disorder, single episode, unspecified: Secondary | ICD-10-CM | POA: Diagnosis not present

## 2018-04-21 DIAGNOSIS — J189 Pneumonia, unspecified organism: Secondary | ICD-10-CM | POA: Diagnosis not present

## 2018-04-21 DIAGNOSIS — M419 Scoliosis, unspecified: Secondary | ICD-10-CM | POA: Diagnosis present

## 2018-04-21 DIAGNOSIS — J690 Pneumonitis due to inhalation of food and vomit: Secondary | ICD-10-CM | POA: Diagnosis present

## 2018-04-21 DIAGNOSIS — R0602 Shortness of breath: Secondary | ICD-10-CM | POA: Diagnosis not present

## 2018-04-21 DIAGNOSIS — N401 Enlarged prostate with lower urinary tract symptoms: Secondary | ICD-10-CM | POA: Diagnosis present

## 2018-04-21 DIAGNOSIS — M810 Age-related osteoporosis without current pathological fracture: Secondary | ICD-10-CM | POA: Diagnosis present

## 2018-04-21 DIAGNOSIS — Z981 Arthrodesis status: Secondary | ICD-10-CM

## 2018-04-21 LAB — BASIC METABOLIC PANEL
Anion gap: 8 (ref 5–15)
BUN: 15 mg/dL (ref 8–23)
CHLORIDE: 97 mmol/L — AB (ref 98–111)
CO2: 31 mmol/L (ref 22–32)
Calcium: 9.2 mg/dL (ref 8.9–10.3)
Creatinine, Ser: 0.49 mg/dL — ABNORMAL LOW (ref 0.61–1.24)
GFR calc Af Amer: >60 mL/min (ref >60)
GFR calc non Af Amer: >60 mL/min (ref >60)
GLUCOSE: 103 mg/dL — AB (ref 70–99)
Potassium: 4.5 mmol/L (ref 3.5–5.1)
SODIUM: 136 mmol/L (ref 135–145)

## 2018-04-21 LAB — I-STAT TROPONIN, ED: Troponin i, poc: 0 ng/mL (ref 0.00–0.08)

## 2018-04-21 MED ORDER — SODIUM CHLORIDE 0.9 % IV SOLN
500.0000 mg | Freq: Once | INTRAVENOUS | Status: AC
  Administered 2018-04-22: 500 mg via INTRAVENOUS
  Filled 2018-04-21: qty 500

## 2018-04-21 MED ORDER — SODIUM CHLORIDE 0.9 % IV SOLN
1.0000 g | Freq: Once | INTRAVENOUS | Status: AC
  Administered 2018-04-21: 1 g via INTRAVENOUS
  Filled 2018-04-21: qty 10

## 2018-04-21 NOTE — ED Triage Notes (Signed)
Per EMS pt. From [facility] with complaint of cough and congestion. Pt. Stated that he's been coughing x 2 weeks but no phlegm but this evening he felt congested  With productive cough. Wanted to be seen here at ED. Denied SOB. Denied pain , denied fever. Al/o x4.

## 2018-04-21 NOTE — ED Provider Notes (Signed)
[facility] DEPT Provider Note   CSN: [id number] Arrival date & time: 04/21/18  2138     History   Chief Complaint Chief Complaint  Patient presents with  . Cough    HPI Manuel Reeves is a 84 y.o. male.  84 year old male with history of polio presents to the ED with a cough for 2 weeks. Today he started to feel more congested with onset of productive cough. He appears mildly short of breath. He denies chest pain, abdominal pain. No reported fevers.  The history is provided by the patient and the spouse. No language interpreter was used.  Cough  This is a recurrent problem. The current episode started more than 1 week ago. The problem has been gradually worsening. The cough is productive of sputum. There has been no fever. Associated symptoms include shortness of breath. Pertinent negatives include no chest pain.    Past Medical History:  Diagnosis Date  . Anxiety   . CLL (chronic lymphocytic leukemia) ([place]) 02/20/2014  . Osteoporosis   . Polio     Patient Active Problem List   Diagnosis Date Noted  . Leukocytosis 02/25/2018  . 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 Acute kidney failure ([facility]) 02/24/2018  . Constipation 02/24/2018  . Hyperkalemia, diminished renal excretion 02/24/2018  . Delirium 02/24/2018  . Acute bilateral obstructive uropathy 02/24/2018  . CLL (chronic lymphocytic leukemia) ([place]) 02/20/2014    Past Surgical History:  Procedure Laterality Date  . CATARACT EXTRACTION  09/2011   at [facility]  . COLONOSCOPY  03/2007  . RETINAL DETACHMENT SURGERY  09/1995  . SPINAL FUSION  10/1952        Home Medications    Prior to Admission medications   Medication Sig Start Date End Date Taking? Authorizing Provider  buPROPion (WELLBUTRIN SR) 150 MG 12 hr tablet Take 1 tablet by mouth Daily. 02/24/12   [provider]  Calcium Carbonate-Vitamin D (CALTRATE 600+D PO) Take 1 tablet by mouth daily.    [provider]  docusate sodium (COLACE) 100 MG capsule Take 100 mg by mouth daily.    [provider]  ergocalciferol (VITAMIN D2) 50000 units capsule Take 50,000 Units by mouth every 14 (fourteen) days. On Monday    [provider]  Multiple Vitamins-Minerals (OCUVITE PO) Take 1 tablet by mouth daily.    [provider]  Multiple Vitamins-Minerals (THEREMS-H) TABS Take 1 tablet by mouth daily.    [provider]  polyethylene glycol (MIRALAX / GLYCOLAX) packet Take 17 g by mouth daily as needed. 03/31/18   Ngetich, Dinah C, NP  sertraline (ZOLOFT) 50 MG tablet Take 50 mg by mouth daily.    [provider]  tamsulosin (FLOMAX) 0.4 MG CAPS capsule Take 1 capsule (0.4 mg total) by mouth daily. 02/28/18   Shelly Coss, MD  zinc oxide 20 % ointment Apply 1 application topically as needed for irritation. To buttocks after every incontinent episode and as needed for redness. May keep at bedside.    [provider]    Family History Family History  Problem Relation Age of Onset  . Colon cancer Sister 37    Social History Social History   Tobacco Use  . Smoking status: Never Smoker  . Smokeless tobacco: Never Used  Substance Use Topics  . Alcohol use: Yes    Comment: occasional beer or wine manybe monthly  . Drug use: No     Allergies   Penicillins  Review of Systems Review of Systems  Respiratory: Positive for cough and shortness of breath.   Cardiovascular: Negative for chest pain.  All other systems reviewed and are negative.    Physical Exam Updated Vital Signs BP (!) 157/81 (BP Location: Left Arm)   Pulse 76   Temp 98.3 F (36.8 C) (Oral)   Resp 20   SpO2 97%   Physical Exam Vitals signs and nursing note reviewed.  Constitutional:      General: He is not in acute distress.    Appearance: He is not diaphoretic.  HENT:     Head: Atraumatic.     Mouth/Throat:     Mouth: Mucous membranes are moist.      Pharynx: Oropharynx is clear.  Eyes:     Conjunctiva/sclera: Conjunctivae normal.  Neck:     Musculoskeletal: Neck supple.  Cardiovascular:     Rate and Rhythm: Normal rate and regular rhythm.  Pulmonary:     Effort: Respiratory distress present.     Breath sounds: Rhonchi present.  Chest:     Chest wall: No tenderness.  Abdominal:     General: Bowel sounds are normal.     Palpations: Abdomen is soft.  Musculoskeletal:     Comments: Bilateral lower extremity weakness. Uses motorized scooter.  Skin:    General: Skin is warm and dry.  Neurological:     Mental Status: He is alert and oriented to person, place, and time.  Psychiatric:        Mood and Affect: Mood normal.        Behavior: Behavior normal.      ED Treatments / Results  Labs (all labs ordered are listed, but only abnormal results are displayed) Labs Reviewed  BASIC METABOLIC PANEL  BRAIN NATRIURETIC PEPTIDE  I-STAT TROPONIN, ED    EKG None  Radiology Dg Chest 2 View  Result Date: 04/21/2018 CLINICAL DATA:  84 year old male with productive cough for 1 week. EXAM: CHEST - 2 VIEW COMPARISON:  Chest radiograph dated 11/17/2013 FINDINGS: Evaluation is limited due to severe thoracic dextroscoliosis. There is an area of opacity in the right lower lobe likely representing a small right pleural effusion and associated atelectasis/infiltrate. An area of opacity at the left apex appears similar to prior radiograph. There is no pneumothorax. Stable cardiac silhouette. Degenerative changes of the spine and scoliosis. No acute osseous pathology. IMPRESSION: Findings likely represent a small right pleural effusion and right lower lobe pneumonia. Clinical correlation and follow-up is recommended. Electronically Signed   By: Anner Crete M.D.   On: 04/21/2018 22:51    Procedures Procedures (including critical care time)  Medications Ordered in ED Medications  cefTRIAXone (ROCEPHIN) 1 g in sodium chloride 0.9 % 100 mL  IVPB (1 g Intravenous New Bag/Given 04/21/18 2343)  azithromycin (ZITHROMAX) 500 mg in sodium chloride 0.9 % 250 mL IVPB (has no administration in time range)     Initial Impression / Assessment and Plan / ED Course  I have reviewed the triage vital signs and the nursing notes.  Pertinent labs & imaging results that were available during my care of the patient were reviewed by me and considered in my medical decision making (see chart for details).     Patient discussed with Dr. Zenia Resides.  Patient with right lower lobe pneumonia. PSI/PORT risk class IV. Will request admission. Rocephin and azithromycin intiated.  Final Clinical Impressions(s) / ED Diagnoses   Final diagnoses:  Cough  Community acquired pneumonia of right lower lobe  of lung [facility])    ED Discharge Orders    None       Etta Quill, NP 04/22/18 [place]    Lacretia Leigh, MD 04/22/18 [phone]

## 2018-04-21 NOTE — ED Notes (Signed)
Bed: WA12 Expected date:  Expected time:  Means of arrival:  Comments: EMS 

## 2018-04-22 ENCOUNTER — Other Ambulatory Visit: Payer: Self-pay

## 2018-04-22 DIAGNOSIS — C9110 Chronic lymphocytic leukemia of B-cell type not having achieved remission: Secondary | ICD-10-CM | POA: Diagnosis not present

## 2018-04-22 DIAGNOSIS — N400 Enlarged prostate without lower urinary tract symptoms: Secondary | ICD-10-CM | POA: Diagnosis present

## 2018-04-22 DIAGNOSIS — J181 Lobar pneumonia, unspecified organism: Secondary | ICD-10-CM | POA: Diagnosis not present

## 2018-04-22 DIAGNOSIS — R059 Cough, unspecified: Secondary | ICD-10-CM | POA: Diagnosis present

## 2018-04-22 DIAGNOSIS — F418 Other specified anxiety disorders: Secondary | ICD-10-CM | POA: Diagnosis not present

## 2018-04-22 DIAGNOSIS — R338 Other retention of urine: Secondary | ICD-10-CM | POA: Diagnosis present

## 2018-04-22 DIAGNOSIS — G14 Postpolio syndrome: Secondary | ICD-10-CM | POA: Diagnosis present

## 2018-04-22 DIAGNOSIS — M419 Scoliosis, unspecified: Secondary | ICD-10-CM | POA: Diagnosis present

## 2018-04-22 DIAGNOSIS — F419 Anxiety disorder, unspecified: Secondary | ICD-10-CM | POA: Diagnosis present

## 2018-04-22 DIAGNOSIS — Z880 Allergy status to penicillin: Secondary | ICD-10-CM | POA: Diagnosis not present

## 2018-04-22 DIAGNOSIS — R05 Cough: Secondary | ICD-10-CM | POA: Diagnosis not present

## 2018-04-22 DIAGNOSIS — G8250 Quadriplegia, unspecified: Secondary | ICD-10-CM | POA: Diagnosis present

## 2018-04-22 DIAGNOSIS — J690 Pneumonitis due to inhalation of food and vomit: Principal | ICD-10-CM

## 2018-04-22 DIAGNOSIS — Z981 Arthrodesis status: Secondary | ICD-10-CM | POA: Diagnosis not present

## 2018-04-22 DIAGNOSIS — F329 Major depressive disorder, single episode, unspecified: Secondary | ICD-10-CM | POA: Diagnosis present

## 2018-04-22 DIAGNOSIS — M810 Age-related osteoporosis without current pathological fracture: Secondary | ICD-10-CM | POA: Diagnosis present

## 2018-04-22 DIAGNOSIS — N401 Enlarged prostate with lower urinary tract symptoms: Secondary | ICD-10-CM | POA: Diagnosis present

## 2018-04-22 DIAGNOSIS — Z79899 Other long term (current) drug therapy: Secondary | ICD-10-CM | POA: Diagnosis not present

## 2018-04-22 LAB — CBC WITH DIFFERENTIAL/PLATELET
ABS IMMATURE GRANULOCYTES: 0.05 10*3/uL (ref 0.00–0.07)
Basophils Absolute: 0.1 10*3/uL (ref 0.0–0.1)
Basophils Relative: 0 %
EOS ABS: 0.3 10*3/uL (ref 0.0–0.5)
Eosinophils Relative: 2 %
HCT: 44.5 % (ref 39.0–52.0)
Hemoglobin: 13.9 g/dL (ref 13.0–17.0)
IMMATURE GRANULOCYTES: 0 %
LYMPHS ABS: 9.7 10*3/uL — AB (ref 0.7–4.0)
Lymphocytes Relative: 64 %
MCH: 30.8 pg (ref 26.0–34.0)
MCHC: 31.2 g/dL (ref 30.0–36.0)
MCV: 98.7 fL (ref 80.0–100.0)
MONOS PCT: 4 %
Monocytes Absolute: 0.6 10*3/uL (ref 0.1–1.0)
NEUTROS PCT: 30 %
Neutro Abs: 4.5 10*3/uL (ref 1.7–7.7)
Platelets: 345 10*3/uL (ref 150–400)
RBC: 4.51 MIL/uL (ref 4.22–5.81)
RDW: 13.4 % (ref 11.5–15.5)
WBC: 15.2 10*3/uL — ABNORMAL HIGH (ref 4.0–10.5)
nRBC: 0 % (ref 0.0–0.2)

## 2018-04-22 LAB — STREP PNEUMONIAE URINARY ANTIGEN: Strep Pneumo Urinary Antigen: NEGATIVE

## 2018-04-22 LAB — BRAIN NATRIURETIC PEPTIDE: B Natriuretic Peptide: 25.9 pg/mL (ref 0.0–100.0)

## 2018-04-22 MED ORDER — SODIUM CHLORIDE 0.9 % IV SOLN
INTRAVENOUS | Status: DC
  Administered 2018-04-22 – 2018-04-23 (×3): via INTRAVENOUS

## 2018-04-22 MED ORDER — SODIUM CHLORIDE 0.9 % IV BOLUS
2000.0000 mL | Freq: Once | INTRAVENOUS | Status: AC
  Administered 2018-04-22: 2000 mL via INTRAVENOUS

## 2018-04-22 MED ORDER — POLYETHYLENE GLYCOL 3350 17 G PO PACK
17.0000 g | PACK | Freq: Every day | ORAL | Status: DC | PRN
  Administered 2018-04-22: 17 g via ORAL
  Filled 2018-04-22: qty 1

## 2018-04-22 MED ORDER — SODIUM CHLORIDE 0.9 % IV SOLN
500.0000 mg | INTRAVENOUS | Status: DC
  Administered 2018-04-22 – 2018-04-23 (×2): 500 mg via INTRAVENOUS
  Filled 2018-04-22 (×2): qty 500

## 2018-04-22 MED ORDER — BUPROPION HCL ER (SR) 150 MG PO TB12
150.0000 mg | ORAL_TABLET | Freq: Every day | ORAL | Status: DC
  Administered 2018-04-22 – 2018-04-24 (×3): 150 mg via ORAL
  Filled 2018-04-22 (×3): qty 1

## 2018-04-22 MED ORDER — SERTRALINE HCL 50 MG PO TABS
50.0000 mg | ORAL_TABLET | Freq: Every day | ORAL | Status: DC
  Administered 2018-04-22 – 2018-04-24 (×3): 50 mg via ORAL
  Filled 2018-04-22 (×3): qty 1

## 2018-04-22 MED ORDER — METRONIDAZOLE 500 MG PO TABS
500.0000 mg | ORAL_TABLET | Freq: Three times a day (TID) | ORAL | Status: DC
  Administered 2018-04-22 – 2018-04-23 (×6): 500 mg via ORAL
  Filled 2018-04-22 (×6): qty 1

## 2018-04-22 MED ORDER — ENOXAPARIN SODIUM 40 MG/0.4ML ~~LOC~~ SOLN
40.0000 mg | SUBCUTANEOUS | Status: DC
  Filled 2018-04-22: qty 0.4

## 2018-04-22 MED ORDER — ADULT MULTIVITAMIN W/MINERALS CH
1.0000 | ORAL_TABLET | Freq: Every day | ORAL | Status: DC
  Administered 2018-04-22 – 2018-04-24 (×3): 1 via ORAL
  Filled 2018-04-22 (×3): qty 1

## 2018-04-22 MED ORDER — ZINC OXIDE 20 % EX OINT
1.0000 "application " | TOPICAL_OINTMENT | CUTANEOUS | Status: DC | PRN
  Filled 2018-04-22: qty 28.35

## 2018-04-22 MED ORDER — SODIUM CHLORIDE 0.9 % IV SOLN
1.0000 g | Freq: Every day | INTRAVENOUS | Status: DC
  Administered 2018-04-22 – 2018-04-23 (×2): 1 g via INTRAVENOUS
  Filled 2018-04-22: qty 10
  Filled 2018-04-22 (×2): qty 1

## 2018-04-22 MED ORDER — DOCUSATE SODIUM 100 MG PO CAPS
100.0000 mg | ORAL_CAPSULE | Freq: Every day | ORAL | Status: DC
  Administered 2018-04-22 – 2018-04-24 (×2): 100 mg via ORAL
  Filled 2018-04-22 (×3): qty 1

## 2018-04-22 MED ORDER — LORAZEPAM 0.5 MG PO TABS
0.5000 mg | ORAL_TABLET | Freq: Every evening | ORAL | Status: DC | PRN
  Administered 2018-04-22 – 2018-04-23 (×2): 0.5 mg via ORAL
  Filled 2018-04-22 (×2): qty 1

## 2018-04-22 MED ORDER — TAMSULOSIN HCL 0.4 MG PO CAPS
0.4000 mg | ORAL_CAPSULE | Freq: Every day | ORAL | Status: DC
  Administered 2018-04-22 – 2018-04-24 (×3): 0.4 mg via ORAL
  Filled 2018-04-22 (×3): qty 1

## 2018-04-22 MED ORDER — ALBUTEROL SULFATE (2.5 MG/3ML) 0.083% IN NEBU: 2.5000 mg | INHALATION_SOLUTION | RESPIRATORY_TRACT | Status: DC | PRN

## 2018-04-22 MED ORDER — THEREMS-H PO TABS: 1.0000 | ORAL_TABLET | Freq: Every day | ORAL | Status: DC

## 2018-04-22 MED ORDER — ENOXAPARIN SODIUM 40 MG/0.4ML ~~LOC~~ SOLN
40.0000 mg | SUBCUTANEOUS | Status: DC
  Administered 2018-04-22 – 2018-04-24 (×3): 40 mg via SUBCUTANEOUS
  Filled 2018-04-22 (×3): qty 0.4

## 2018-04-22 MED ORDER — DM-GUAIFENESIN ER 30-600 MG PO TB12
1.0000 | ORAL_TABLET | Freq: Two times a day (BID) | ORAL | Status: DC
  Administered 2018-04-22 – 2018-04-24 (×6): 1 via ORAL
  Filled 2018-04-22 (×7): qty 1

## 2018-04-22 NOTE — ED Notes (Signed)
After taking antibiotic pill with water, the patient's voice was noted to be "wet." Hospitalist notified. Speech therapy ordered for eval.

## 2018-04-22 NOTE — Evaluation (Signed)
Clinical/Bedside Swallow Evaluation Patient Details  Name: Manuel Reeves MRN: [id number] Date of Birth: 1932/09/24  Today's Date: 04/22/2018 Time:        Past Medical History:  Past Medical History:  Diagnosis Date  . Anxiety   . CLL (chronic lymphocytic leukemia) ([place]) 02/20/2014  . Osteoporosis   . Polio    Past Surgical History:  Past Surgical History:  Procedure Laterality Date  . CATARACT EXTRACTION  09/2011   at [facility]  . COLONOSCOPY  03/2007  . RETINAL DETACHMENT SURGERY  09/1995  . SPINAL FUSION  10/1952   HPI:  84 yo male with CLL, post-polio and bilateral leg weakness adm to [facility] with productive cough.  CXR showed right pleural effusion and lower lobe infiltration.  Concern present for possible aspiration pna.  Pt with PMH also + for scoliosis, BPH, osteoporosis, depression and anxiety. Pt has also had recent fall and hit head on toilet 02/2018.  He has a foley cath and is scheduled for prostate surgery 05/09/18.  Pt resides at ALF.  Pt also with delirum and heart failure.     Assessment / Plan / Recommendation Clinical Impression  Patient presents with functional oropharyngeal swallow ability with no indications of dysphagia or aspiration.  Pt CN exam unremarkable but he does have a weak cough due to his post-polio and scoliosis.  Pt easily passed 3 ounce water test and denies any issues with swallowing. Advsied him to monitor his swallowing (especially esophageal with level of scoliosis) closely and advise MD if note poor esophageal clearance or reflux.   Regular diet placed and NT informed.   SLP Visit Diagnosis: Dysphagia, unspecified (R13.10)    Aspiration Risk  Mild aspiration risk(only due to weak cough)    Diet Recommendation Regular;Thin liquid   Liquid Administration via: Cup Medication Administration: Whole meds with liquid Supervision: Patient able to self feed Compensations: Slow rate;Small sips/bites Postural Changes: Seated upright at 90  degrees;Remain upright for at least 30 minutes after po intake    Other  Recommendations Oral Care Recommendations: Oral care BID   Follow up Recommendations None      Frequency and Duration   n/a         Prognosis   n/a     Swallow Study   General Date of Onset: 04/22/18 HPI: 84 yo male with CLL, post-polio and bilateral leg weakness adm to [facility] with productive cough.  CXR showed right pleural effusion and lower lobe infiltration.  Concern present for possible aspiration pna.  Pt with PMH also + for scoliosis, BPH, osteoporosis, depression and anxiety. Pt has also had recent fall and hit head on toilet 02/2018.  He has a foley cath and is scheduled for prostate surgery 05/09/18.  Pt resides at ALF.  Pt also with delirum and heart failure.   Type of Study: Bedside Swallow Evaluation Diet Prior to this Study: NPO Temperature Spikes Noted: No Respiratory Status: Nasal cannula History of Recent Intubation: No Behavior/Cognition: Alert;Cooperative Oral Cavity Assessment: Within Functional Limits Oral Cavity - Dentition: Adequate natural dentition Vision: Functional for self-feeding Self-Feeding Abilities: Able to feed self Patient Positioning: Upright in bed Baseline Vocal Quality: Normal;Low vocal intensity Volitional Cough: Weak Volitional Swallow: Able to elicit    Oral/Motor/Sensory Function Overall Oral Motor/Sensory Function: Within functional limits   Ice Chips Ice chips: Not tested   Thin Liquid Thin Liquid: Within functional limits Presentation: Cup Other Comments: 3 ounce water test    Nectar Thick Nectar Thick Liquid: Not  tested   Honey Thick Honey Thick Liquid: Not tested   Puree Puree: Within functional limits Presentation: Self Fed;Spoon   Solid     Solid: Within functional limits Presentation: Self Fredirick Lathe 04/22/2018,11:58 AM   Luanna Salk, MS [facility] SLP [place] Pager [phone] Office [phone]

## 2018-04-22 NOTE — ED Notes (Signed)
ED TO INPATIENT HANDOFF REPORT  Name/Age/Gender Manuel Reeves 84 y.o. male  Code Status    Code Status Orders  (From admission, onward)         Start     Ordered   04/22/18 0043  Full code  Continuous     04/22/18 0043        Code Status History    Date Active Date Inactive Code Status Order ID Comments User Context   02/24/2018 2224 02/27/2018 1917 Full Code [id number]  Etta Quill, DO ED      Home/SNF/Other Home  Chief Complaint conjestion  Level of Care/Admitting Diagnosis ED Disposition    ED Disposition Condition [place]: [facility] [[id number]]  Level of Care: Med-Surg [16]  Diagnosis: Aspiration pneumonia [facility]) [[id number]]  Admitting Physician: Ivor Costa [[id number]]  Attending Physician: Ivor Costa [[id number]]  PT Class (Do Not Modify): Observation [104]  PT Acc Code (Do Not Modify): Observation [10022]       Medical History Past Medical History:  Diagnosis Date  . Anxiety   . CLL (chronic lymphocytic leukemia) ([place]) 02/20/2014  . Osteoporosis   . Polio     Allergies Allergies  Allergen Reactions  . Penicillins Rash    Has patient had a PCN reaction causing immediate rash, facial/tongue/throat swelling, SOB or lightheadedness with hypotension: Y Has patient had a PCN reaction causing severe rash involving mucus membranes or skin necrosis: N Has patient had a PCN reaction that required hospitalization: N Has patient had a PCN reaction occurring within the last 10 years: N If all of the above answers are "NO", then may proceed with Cephalosporin use.       IV Location/Drains/Wounds Patient Lines/Drains/Airways Status   Active Line/Drains/Airways    Name:   Placement date:   Placement time:   Site:   Days:   Peripheral IV 04/21/18 Left Antecubital   04/21/18    2258    Antecubital   1   Peripheral IV 04/22/18 Right Hand   04/22/18    0140    Hand   less than 1   Urethral Catheter [place]ney [place]ers, RN 16 Fr.    02/26/18    0048    -   55          Labs/Imaging Results for orders placed or performed during the hospital encounter of 04/21/18 (from the past 48 hour(s))  Basic metabolic panel     Status: Abnormal   Collection Time: 04/21/18 10:43 PM  Result Value Ref Range   Sodium 136 135 - 145 mmol/L   Potassium 4.5 3.5 - 5.1 mmol/L   Chloride 97 (L) 98 - 111 mmol/L   CO2 31 22 - 32 mmol/L   Glucose, Bld 103 (H) 70 - 99 mg/dL   BUN 15 8 - 23 mg/dL   Creatinine, Ser 0.49 (L) 0.61 - 1.24 mg/dL   Calcium 9.2 8.9 - 10.3 mg/dL   GFR calc non Af Amer >60 >60 mL/min   GFR calc Af Amer >60 >60 mL/min   Anion gap 8 5 - 15    Comment: Performed at [facility], [place] [address], [place], [place] [address]  Brain natriuretic peptide     Status: None   Collection Time: 04/21/18 10:43 PM  Result Value Ref Range   B Natriuretic Peptide 25.9 0.0 - 100.0 pg/mL    Comment: Performed at [facility], [address] [place],  [place] [address]  I-stat troponin, ED     Status: None   Collection Time: 04/21/18 11:09 PM  Result Value Ref Range   Troponin i, poc 0.00 0.00 - 0.08 ng/mL   Comment 3            Comment: Due to the release kinetics of cTnI, a negative result within the first hours of the onset of symptoms does not rule out myocardial infarction with certainty. If myocardial infarction is still suspected, repeat the test at appropriate intervals.   CBC with Differential/Platelet     Status: Abnormal   Collection Time: 04/22/18  1:40 AM  Result Value Ref Range   WBC 15.2 (H) 4.0 - 10.5 K/uL   RBC 4.51 4.22 - 5.81 MIL/uL   Hemoglobin 13.9 13.0 - 17.0 g/dL   HCT 44.5 39.0 - 52.0 %   MCV 98.7 80.0 - 100.0 fL   MCH 30.8 26.0 - 34.0 pg   MCHC 31.2 30.0 - 36.0 g/dL   RDW 13.4 11.5 - 15.5 %   Platelets 345 150 - 400 K/uL   nRBC 0.0 0.0 - 0.2 %   Neutrophils Relative % 30 %   Neutro Abs 4.5 1.7 - 7.7 K/uL   Lymphocytes Relative 64 %   Lymphs Abs 9.7 (H)  0.7 - 4.0 K/uL   Monocytes Relative 4 %   Monocytes Absolute 0.6 0.1 - 1.0 K/uL   Eosinophils Relative 2 %   Eosinophils Absolute 0.3 0.0 - 0.5 K/uL   Basophils Relative 0 %   Basophils Absolute 0.1 0.0 - 0.1 K/uL   WBC Morphology ABSOLUTE LYMPHOCYTOSIS    Immature Granulocytes 0 %   Abs Immature Granulocytes 0.05 0.00 - 0.07 K/uL   Smudge Cells PRESENT     Comment: Performed at [facility], [place] [address] [place], [place] [address]   Dg Chest 2 View  Result Date: 04/21/2018 CLINICAL DATA:  84 year old male with productive cough for 1 week. EXAM: CHEST - 2 VIEW COMPARISON:  Chest radiograph dated 11/17/2013 FINDINGS: Evaluation is limited due to severe thoracic dextroscoliosis. There is an area of opacity in the right lower lobe likely representing a small right pleural effusion and associated atelectasis/infiltrate. An area of opacity at the left apex appears similar to prior radiograph. There is no pneumothorax. Stable cardiac silhouette. Degenerative changes of the spine and scoliosis. No acute osseous pathology. IMPRESSION: Findings likely represent a small right pleural effusion and right lower lobe pneumonia. Clinical correlation and follow-up is recommended. Electronically Signed   By: Anner Crete M.D.   On: 04/21/2018 22:51   None  Pending Labs Unresulted Labs (From admission, onward)    Start     Ordered   04/22/18 0140  Pathologist smear review  Once,   R     04/22/18 0140   04/22/18 0043  Legionella Pneumophila Serogp 1 Ur Ag  Once,   R     04/22/18 0043   04/22/18 0041  Culture, blood (routine x 2) Call MD if unable to obtain prior to antibiotics being given  BLOOD CULTURE X 2,   R    Comments:  If blood cultures drawn in Emergency Department - Do not draw and cancel order    04/22/18 0043   04/22/18 0041  Culture, sputum-assessment  Once,   R     04/22/18 0043   04/22/18 0041  Gram stain  Once,   R     04/22/18 0043   04/22/18 0041  Strep  pneumoniae urinary antigen  Once,   R     04/22/18 0043          Vitals/Pain Today's Vitals   04/22/18 0630 04/22/18 0700 04/22/18 0730 04/22/18 0800  BP: (!) 157/74 135/81 133/80 (!) 155/83  Pulse: 75 72 74 84  Resp: 17 16 14 20   Temp:      TempSrc:      SpO2: 98% 99% 97% 97%  PainSc:        Isolation Precautions No active isolations  Medications Medications  buPROPion (WELLBUTRIN SR) 12 hr tablet 150 mg (has no administration in time range)  LORazepam (ATIVAN) tablet 0.5 mg (has no administration in time range)  sertraline (ZOLOFT) tablet 50 mg (has no administration in time range)  docusate sodium (COLACE) capsule 100 mg (has no administration in time range)  polyethylene glycol (MIRALAX / GLYCOLAX) packet 17 g (has no administration in time range)  tamsulosin (FLOMAX) capsule 0.4 mg (has no administration in time range)  THEREMS-H TABS 1 tablet (has no administration in time range)  zinc oxide 20 % ointment 1 application (has no administration in time range)  0.9 %  sodium chloride infusion ( Intravenous New Bag/Given 04/22/18 0218)  enoxaparin (LOVENOX) injection 40 mg (has no administration in time range)  cefTRIAXone (ROCEPHIN) 1 g in sodium chloride 0.9 % 100 mL IVPB (has no administration in time range)  azithromycin (ZITHROMAX) 500 mg in sodium chloride 0.9 % 250 mL IVPB (has no administration in time range)  metroNIDAZOLE (FLAGYL) tablet 500 mg (500 mg Oral Given 04/22/18 0217)  albuterol (PROVENTIL) (2.5 MG/3ML) 0.083% nebulizer solution 2.5 mg (has no administration in time range)  dextromethorphan-guaiFENesin (MUCINEX DM) 30-600 MG per 12 hr tablet 1 tablet (1 tablet Oral Given 04/22/18 0218)  cefTRIAXone (ROCEPHIN) 1 g in sodium chloride 0.9 % 100 mL IVPB (0 g Intravenous Stopped 04/22/18 0017)  azithromycin (ZITHROMAX) 500 mg in sodium chloride 0.9 % 250 mL IVPB (0 mg Intravenous Stopped 04/22/18 0219)  sodium chloride 0.9 % bolus 2,000 mL (0 mLs Intravenous  Stopped 04/22/18 0311)    Mobility walks with person assist

## 2018-04-22 NOTE — ED Notes (Signed)
ED TO INPATIENT HANDOFF REPORT  Name/Age/Gender Manuel Reeves 84 y.o. male  Code Status    Code Status Orders  (From admission, onward)         Start     Ordered   04/22/18 0043  Full code  Continuous     04/22/18 0043        Code Status History    Date Active Date Inactive Code Status Order ID Comments User Context   02/24/2018 2224 02/27/2018 1917 Full Code [id number]  Etta Quill, DO ED      Home/SNF/Other Nursing Home  Chief Complaint conjestion  Level of Care/Admitting Diagnosis ED Disposition    ED Disposition Condition [place]: [facility] [[id number]]  Level of Care: Med-Surg [16]  Diagnosis: Aspiration pneumonia [facility]) [[id number]]  Admitting Physician: Ivor Costa [[id number]]  Attending Physician: Ivor Costa [[id number]]  PT Class (Do Not Modify): Observation [104]  PT Acc Code (Do Not Modify): Observation [10022]       Medical History Past Medical History:  Diagnosis Date  . Anxiety   . CLL (chronic lymphocytic leukemia) ([place]) 02/20/2014  . Osteoporosis   . Polio     Allergies Allergies  Allergen Reactions  . Penicillins Rash    Has patient had a PCN reaction causing immediate rash, facial/tongue/throat swelling, SOB or lightheadedness with hypotension: Y Has patient had a PCN reaction causing severe rash involving mucus membranes or skin necrosis: N Has patient had a PCN reaction that required hospitalization: N Has patient had a PCN reaction occurring within the last 10 years: N If all of the above answers are "NO", then may proceed with Cephalosporin use.       IV Location/Drains/Wounds Patient Lines/Drains/Airways Status   Active Line/Drains/Airways    Name:   Placement date:   Placement time:   Site:   Days:   Peripheral IV 04/21/18 Left Antecubital   04/21/18    2258    Antecubital   1   Urethral Catheter [place], RN 16 Fr.   02/26/18    0048    -   55          Labs/Imaging Results for  orders placed or performed during the hospital encounter of 04/21/18 (from the past 48 hour(s))  Basic metabolic panel     Status: Abnormal   Collection Time: 04/21/18 10:43 PM  Result Value Ref Range   Sodium 136 135 - 145 mmol/L   Potassium 4.5 3.5 - 5.1 mmol/L   Chloride 97 (L) 98 - 111 mmol/L   CO2 31 22 - 32 mmol/L   Glucose, Bld 103 (H) 70 - 99 mg/dL   BUN 15 8 - 23 mg/dL   Creatinine, Ser 0.49 (L) 0.61 - 1.24 mg/dL   Calcium 9.2 8.9 - 10.3 mg/dL   GFR calc non Af Amer >60 >60 mL/min   GFR calc Af Amer >60 >60 mL/min   Anion gap 8 5 - 15    Comment: Performed at [facility], [place] [address], [place], [place] [address]  Brain natriuretic peptide     Status: None   Collection Time: 04/21/18 10:43 PM  Result Value Ref Range   B Natriuretic Peptide 25.9 0.0 - 100.0 pg/mL    Comment: Performed at [facility], [place] [address] [place], [place] [address]  I-stat troponin, ED     Status: None   Collection Time: 04/21/18 11:09 PM  Result Value  Ref Range   Troponin i, poc 0.00 0.00 - 0.08 ng/mL   Comment 3            Comment: Due to the release kinetics of cTnI, a negative result within the first hours of the onset of symptoms does not rule out myocardial infarction with certainty. If myocardial infarction is still suspected, repeat the test at appropriate intervals.   CBC with Differential/Platelet     Status: Abnormal   Collection Time: 04/22/18  1:40 AM  Result Value Ref Range   WBC 15.2 (H) 4.0 - 10.5 K/uL   RBC 4.51 4.22 - 5.81 MIL/uL   Hemoglobin 13.9 13.0 - 17.0 g/dL   HCT 44.5 39.0 - 52.0 %   MCV 98.7 80.0 - 100.0 fL   MCH 30.8 26.0 - 34.0 pg   MCHC 31.2 30.0 - 36.0 g/dL   RDW 13.4 11.5 - 15.5 %   Platelets 345 150 - 400 K/uL   nRBC 0.0 0.0 - 0.2 %   Neutrophils Relative % 30 %   Neutro Abs 4.5 1.7 - 7.7 K/uL   Lymphocytes Relative 64 %   Lymphs Abs 9.7 (H) 0.7 - 4.0 K/uL   Monocytes Relative 4 %   Monocytes Absolute 0.6 0.1  - 1.0 K/uL   Eosinophils Relative 2 %   Eosinophils Absolute 0.3 0.0 - 0.5 K/uL   Basophils Relative 0 %   Basophils Absolute 0.1 0.0 - 0.1 K/uL   WBC Morphology ABSOLUTE LYMPHOCYTOSIS    Immature Granulocytes 0 %   Abs Immature Granulocytes 0.05 0.00 - 0.07 K/uL   Smudge Cells PRESENT     Comment: Performed at [facility], [place] [address] [place], [place] [address]   Dg Chest 2 View  Result Date: 04/21/2018 CLINICAL DATA:  84 year old male with productive cough for 1 week. EXAM: CHEST - 2 VIEW COMPARISON:  Chest radiograph dated 11/17/2013 FINDINGS: Evaluation is limited due to severe thoracic dextroscoliosis. There is an area of opacity in the right lower lobe likely representing a small right pleural effusion and associated atelectasis/infiltrate. An area of opacity at the left apex appears similar to prior radiograph. There is no pneumothorax. Stable cardiac silhouette. Degenerative changes of the spine and scoliosis. No acute osseous pathology. IMPRESSION: Findings likely represent a small right pleural effusion and right lower lobe pneumonia. Clinical correlation and follow-up is recommended. Electronically Signed   By: Anner Crete M.D.   On: 04/21/2018 22:51   None  Pending Labs Unresulted Labs (From admission, onward)    Start     Ordered   04/22/18 0140  Pathologist smear review  Once,   R     04/22/18 0140   04/22/18 0043  Legionella Pneumophila Serogp 1 Ur Ag  Once,   R     04/22/18 0043   04/22/18 0041  Culture, blood (routine x 2) Call MD if unable to obtain prior to antibiotics being given  BLOOD CULTURE X 2,   R    Comments:  If blood cultures drawn in Emergency Department - Do not draw and cancel order    04/22/18 0043   04/22/18 0041  Culture, sputum-assessment  Once,   R     04/22/18 0043   04/22/18 0041  Gram stain  Once,   R     04/22/18 0043   04/22/18 0041  Strep pneumoniae urinary antigen  Once,   R     04/22/18 0043  Vitals/Pain Today's Vitals   04/21/18 2345 04/22/18 0045 04/22/18 0100 04/22/18 0218  BP:    133/85  Pulse: 77 82 85 80  Resp: 20 20 19  (!) 21  Temp:      TempSrc:      SpO2: 93% 93% 93% 93%  PainSc:    0-No pain    Isolation Precautions No active isolations  Medications Medications  buPROPion (WELLBUTRIN SR) 12 hr tablet 150 mg (has no administration in time range)  LORazepam (ATIVAN) tablet 0.5 mg (has no administration in time range)  sertraline (ZOLOFT) tablet 50 mg (has no administration in time range)  docusate sodium (COLACE) capsule 100 mg (has no administration in time range)  polyethylene glycol (MIRALAX / GLYCOLAX) packet 17 g (has no administration in time range)  tamsulosin (FLOMAX) capsule 0.4 mg (has no administration in time range)  THEREMS-H TABS 1 tablet (has no administration in time range)  zinc oxide 20 % ointment 1 application (has no administration in time range)  0.9 %  sodium chloride infusion ( Intravenous New Bag/Given 04/22/18 0218)  enoxaparin (LOVENOX) injection 40 mg (has no administration in time range)  cefTRIAXone (ROCEPHIN) 1 g in sodium chloride 0.9 % 100 mL IVPB (has no administration in time range)  azithromycin (ZITHROMAX) 500 mg in sodium chloride 0.9 % 250 mL IVPB (has no administration in time range)  metroNIDAZOLE (FLAGYL) tablet 500 mg (500 mg Oral Given 04/22/18 0217)  albuterol (PROVENTIL) (2.5 MG/3ML) 0.083% nebulizer solution 2.5 mg (has no administration in time range)  dextromethorphan-guaiFENesin (MUCINEX DM) 30-600 MG per 12 hr tablet 1 tablet (1 tablet Oral Given 04/22/18 0218)  cefTRIAXone (ROCEPHIN) 1 g in sodium chloride 0.9 % 100 mL IVPB (0 g Intravenous Stopped 04/22/18 0017)  azithromycin (ZITHROMAX) 500 mg in sodium chloride 0.9 % 250 mL IVPB (0 mg Intravenous Stopped 04/22/18 0219)  sodium chloride 0.9 % bolus 2,000 mL (2,000 mLs Intravenous New Bag/Given 04/22/18 0058)    Mobility

## 2018-04-22 NOTE — H&P (Signed)
History and Physical    Manuel Reeves QAS:[id number] DOB: [birth year] DOA: 04/21/2018  Referring MD/NP/PA:   PCP: Crist Infante, MD   Patient coming from:  The patient is coming from independent living facility.  At baseline, pt is dependent for most of ADL.        Chief Complaint: Cough  HPI: Manuel Reeves is a 84 y.o. male with medical history significant of CLL and observation, polio with postpolio syndrome, osteoporosis, BPH, depression with anxiety, scoliosis, who presents with cough.  Patient states that he has been having cough for more than 2 weeks.  He coughs up yellow-colored sputum.  He has mild shortness of breath, but no chest pain, fever or chills.  Denies nausea, vomiting, diarrhea, abdominal pain.  Pain is postpolio syndrome with weakness, cannot walk.  Patient states that he has possible BPH and difficulty urinating.  He has Foley catheter placed by urologist.  He is scheduled for prostate surgery at 12/30.  ED Course: pt was found to have BNP 25.9, pending CBC, electrolytes renal function okay, no tachycardia, has tachypnea, oxygen saturation 94% on room air, temperature normal.  Chest x-ray showed small right pleural effusion and right lower lobe infiltration.  Patient is placed on MedSurg bed for observation.  Review of Systems:   General: no fevers, chills, no body weight gain, has fatigue HEENT: no blurry vision, hearing changes or sore throat Respiratory: Has dyspnea, coughing, no wheezing CV: no chest pain, no palpitations GI: no nausea, vomiting, abdominal pain, diarrhea, constipation GU: no dysuria, burning on urination, increased urinary frequency, hematuria  Ext: no leg edema Neuro: no vision change or hearing loss, has post polio syndrome with bilateral leg weakness Skin: no rash, no skin tear. MSK: No muscle spasm, no deformity, no limitation of range of movement in spin Heme: No easy bruising.  Travel history: No recent long distant  travel.  Allergy:  Allergies  Allergen Reactions  . Penicillins Rash    Has patient had a PCN reaction causing immediate rash, facial/tongue/throat swelling, SOB or lightheadedness with hypotension: Y Has patient had a PCN reaction causing severe rash involving mucus membranes or skin necrosis: N Has patient had a PCN reaction that required hospitalization: N Has patient had a PCN reaction occurring within the last 10 years: N If all of the above answers are "NO", then may proceed with Cephalosporin use.       Past Medical History:  Diagnosis Date  . Anxiety   . CLL (chronic lymphocytic leukemia) ([place]) 02/20/2014  . Osteoporosis   . Polio     Past Surgical History:  Procedure Laterality Date  . CATARACT EXTRACTION  09/2011   at [facility]  . COLONOSCOPY  03/2007  . RETINAL DETACHMENT SURGERY  09/1995  . SPINAL FUSION  10/1952    Social History:  reports that he has never smoked. He has never used smokeless tobacco. He reports current alcohol use. He reports that he does not use drugs.  Family History:  Family History  Problem Relation Age of Onset  . Colon cancer Sister 22     Prior to Admission medications   Medication Sig Start Date End Date Taking? Authorizing Provider  buPROPion (WELLBUTRIN SR) 150 MG 12 hr tablet Take 1 tablet by mouth Daily. 02/24/12  Yes [provider]  Calcium Carbonate-Vitamin D (CALTRATE 600+D PO) Take 1 tablet by mouth daily.   Yes [provider]  docusate sodium (COLACE) 100 MG capsule Take 100 mg by mouth  daily.   Yes [provider]  ergocalciferol (VITAMIN D2) 50000 units capsule Take 50,000 Units by mouth every 14 (fourteen) days. On Monday   Yes [provider]  LORazepam (ATIVAN) 0.5 MG tablet Take 0.5 mg by mouth at bedtime as needed for sleep.   Yes [provider]  Multiple Vitamins-Minerals (OCUVITE PO) Take 1 tablet by mouth daily.   Yes [provider]  Multiple  Vitamins-Minerals (THEREMS-H) TABS Take 1 tablet by mouth daily.   Yes [provider]  polyethylene glycol (MIRALAX / GLYCOLAX) packet Take 17 g by mouth daily as needed. Patient taking differently: Take 17 g by mouth daily as needed for mild constipation.  03/31/18  Yes Ngetich, Dinah C, NP  sertraline (ZOLOFT) 50 MG tablet Take 50 mg by mouth daily.   Yes [provider]  tamsulosin (FLOMAX) 0.4 MG CAPS capsule Take 1 capsule (0.4 mg total) by mouth daily. 02/28/18  Yes Shelly Coss, MD  zinc oxide 20 % ointment Apply 1 application topically as needed for irritation. To buttocks after every incontinent episode and as needed for redness. May keep at bedside.   Yes [provider]    Physical Exam: Vitals:   04/21/18 2330 04/21/18 2345 04/22/18 0045 04/22/18 0100  BP:      Pulse: 77 77 82 85  Resp: (!) 22 20 20 19   Temp:      TempSrc:      SpO2: 95% 93% 93% 93%   General: Not in acute distress HEENT:       Eyes: PERRL, EOMI, no scleral icterus.       ENT: No discharge from the ears and nose, no pharynx injection, no tonsillar enlargement.        Neck: No JVD, no bruit, no mass felt. Heme: No neck lymph node enlargement. Cardiac: S1/S2, RRR, No murmurs, No gallops or rubs. Respiratory: Has rhonchi bilaterally.   GI: Soft, nondistended, nontender, no rebound pain, no organomegaly, BS present. GU: No hematuria Ext: No pitting leg edema bilaterally. 2+DP/PT pulse bilaterally. Musculoskeletal: No joint deformities, No joint redness or warmth, no limitation of ROM in spin.  Has scoliosis. Skin: No rashes.  Neuro: Alert, oriented X3, cranial nerves II-XII grossly intact, has postpolio syndrome with bilateral leg weakness. Psych: Patient is not psychotic, no suicidal or hemocidal ideation.  Labs on Admission: I have personally reviewed following labs and imaging studies  CBC: No results for input(s): WBC, NEUTROABS, HGB, HCT, MCV, PLT in the last 168  hours. Basic Metabolic Panel: Recent Labs  Lab 04/21/18 2243  NA 136  K 4.5  CL 97*  CO2 31  GLUCOSE 103*  BUN 15  CREATININE 0.49*  CALCIUM 9.2   GFR: Estimated Creatinine Clearance: 74.5 mL/min (A) (by C-G formula based on SCr of 0.49 mg/dL (L)). Liver Function Tests: No results for input(s): AST, ALT, ALKPHOS, BILITOT, PROT, ALBUMIN in the last 168 hours. No results for input(s): LIPASE, AMYLASE in the last 168 hours. No results for input(s): AMMONIA in the last 168 hours. Coagulation Profile: No results for input(s): INR, PROTIME in the last 168 hours. Cardiac Enzymes: No results for input(s): CKTOTAL, CKMB, CKMBINDEX, TROPONINI in the last 168 hours. BNP (last 3 results) No results for input(s): PROBNP in the last 8760 hours. HbA1C: No results for input(s): HGBA1C in the last 72 hours. CBG: No results for input(s): GLUCAP in the last 168 hours. Lipid Profile: No results for input(s): CHOL, HDL, LDLCALC, TRIG, CHOLHDL, LDLDIRECT in  the last 72 hours. Thyroid Function Tests: No results for input(s): TSH, T4TOTAL, FREET4, T3FREE, THYROIDAB in the last 72 hours. Anemia Panel: No results for input(s): VITAMINB12, FOLATE, FERRITIN, TIBC, IRON, RETICCTPCT in the last 72 hours. Urine analysis:    Component Value Date/Time   COLORURINE YELLOW 02/24/2018 1733   APPEARANCEUR CLEAR 02/24/2018 1733   LABSPEC 1.013 02/24/2018 1733   PHURINE 6.0 02/24/2018 1733   GLUCOSEU NEGATIVE 02/24/2018 1733   HGBUR SMALL (A) 02/24/2018 1733   BILIRUBINUR NEGATIVE 02/24/2018 1733   KETONESUR NEGATIVE 02/24/2018 1733   PROTEINUR NEGATIVE 02/24/2018 1733   NITRITE NEGATIVE 02/24/2018 1733   LEUKOCYTESUR NEGATIVE 02/24/2018 1733   Sepsis Labs: @LABRCNTIP (procalcitonin:4,lacticidven:4) )No results found for this or any previous visit (from the past 240 hour(s)).   Radiological Exams on Admission: Dg Chest 2 View  Result Date: 04/21/2018 CLINICAL DATA:  84 year old male with  productive cough for 1 week. EXAM: CHEST - 2 VIEW COMPARISON:  Chest radiograph dated 11/17/2013 FINDINGS: Evaluation is limited due to severe thoracic dextroscoliosis. There is an area of opacity in the right lower lobe likely representing a small right pleural effusion and associated atelectasis/infiltrate. An area of opacity at the left apex appears similar to prior radiograph. There is no pneumothorax. Stable cardiac silhouette. Degenerative changes of the spine and scoliosis. No acute osseous pathology. IMPRESSION: Findings likely represent a small right pleural effusion and right lower lobe pneumonia. Clinical correlation and follow-up is recommended. Electronically Signed   By: Anner Crete M.D.   On: 04/21/2018 22:51     EKG:  Not done in ED, will get one.   Assessment/Plan Principal Problem:   Aspiration pneumonia (HCC) Active Problems:   CLL (chronic lymphocytic leukemia) (HCC)   BPH (benign prostatic hyperplasia)   Depression with anxiety   Cough  Possible Aspiration pneumonia vs. Bronchitis: Patient has productive cough for 2 weeks.  He has CLL, likely has leukocytosis, but CBC is pending.  Patient does not have fever or chest pain.  He reports mild shortness of breath.  Clinically does not seem to have typical lobar pneumonia or HCAP.  Given infiltration in right lower lobe and history of postpolio syndrome and scoliosis, aspiration pneumonia is a potential differential diagnosis.  Acute bronchitis is also possible.  Clinically patient is not septic.  Hemodynamically stable.  - Will place on med-surg bed for obs. - IV Rocephin and azithromycin were started in ED, will add flagyl - Mucinex for cough  - prn Albuterol Nebs for SOB - Urine legionella and S. pneumococcal antigen - Follow up blood culture x2, sputum culture - IVF: 2L of NS bolus in ED, followed by 100 mL per hour of NS  -keep pt NPO until SLP is done  CLL (chronic lymphocytic leukemia) ([place]): following up with  Dr. Addison Bailey. Under observation. -f/u with dr. Addison Bailey  BPH (benign prostatic hyperplasia): pt is followed by urologist. Has Foley cath placed. He is scheduled for surgery on 12/30. -Continue Flomax  Depression with anxiety: no SI or HI -Wellbutrin, Zoloft, Ativan,   DVT ppx: SQ Lovenox Code Status: Full code Family Communication:    Yes, patient's wife at bed side Disposition Plan:  Anticipate discharge back to previous home environment Consults called: None Admission status:  medical floor/obs     Date of Service 04/22/2018    Ivor Costa [facility] Pager [phone]  If 7PM-7AM, please contact night-coverage [email or web address] Password [email or web address] 04/22/2018, 1:35 AM

## 2018-04-22 NOTE — Progress Notes (Signed)
PROGRESS NOTE   Manuel Reeves  BHA:[id number]    DOB: 03-15-1933    DOA: 04/21/2018  PCP: Crist Infante, MD   I have briefly reviewed patients previous medical records in [facility].  Brief Narrative:  84 year old married male, lives with spouse, moves around with the help of a wheelchair, PMH of polio with postpolio syndrome (developed at age 31) with associated quadriparesis, CLL under observation, osteoporosis, anxiety and depression, BPH with urinary retention awaiting prostate surgery 12/30 (Dr. Karsten Ro), presented to [facility] long ED on 04/21/2018 due to productive cough, dyspnea without chest pain, fever or chills and admitted for right lower lobe pneumonia.   Assessment & Plan:   Principal Problem:   Aspiration pneumonia ([place]) Active Problems:   CLL (chronic lymphocytic leukemia) (HCC)   BPH (benign prostatic hyperplasia)   Depression with anxiety   Cough   Lobar pneumonia (RLL): Patient denies history of swallowing difficulties.  Suspect community-acquired pneumonia and less likely aspiration pneumonia.  He does have deformed chest wall due to post polio syndrome and scoliosis.  Continue empirically started IV ceftriaxone, azithromycin and Flagyl.  Follow urine Legionella and pneumococcal antigen, blood cultures and sputum culture.  Speech therapy swallow evaluation pending.  Aggressive pulmonary toilet.  CLL: Under observation by Dr. Alvy Bimler.  Outpatient follow-up.  BPH with urinary retention: Has indwelling Foley catheter and scheduled for surgery 05/09/2018.  Continue Flomax.  Anxiety and depression: Stable without suicidal or homicidal ideations.  Continue Wellbutrin, Zoloft and Ativan.  Post polio syndrome with quadriplegia: Wheelchair mobile.  Baseline and stable.   DVT prophylaxis: Lovenox Code Status: Full Family Communication: None at bedside Disposition: To home pending clinical improvement.   Consultants:  None  Procedures:   None  Antimicrobials:  Ceftriaxone, azithromycin and oral Flagyl 12/12 >   Subjective: Seen this morning in the ED.  Feels better.  "Less congested".  Denies dyspnea.  No chest pain, fever or chills.  Denies swallowing difficulty or choking.  ROS: As above  Objective:  Vitals:   04/22/18 0730 04/22/18 0800 04/22/18 0830 04/22/18 0900  BP: 133/80 (!) 155/83 (!) 135/92 (!) 132/92  Pulse: 74 84 76 71  Resp: 14 20 19 17   Temp:      TempSrc:      SpO2: 97% 97% 93% 95%    Examination:  General exam: Sent elderly male, moderately built and nourished lying comfortably supine in bed. Respiratory system: Deformed chest wall related to polio.  Diminished breath sounds in the bases with few bibasilar crackles.  Rest of lung fields clear to auscultation. Respiratory effort normal. Cardiovascular system: S1 & S2 heard, RRR. No JVD, murmurs, rubs, gallops or clicks. No pedal edema. Gastrointestinal system: Abdomen is nondistended, soft and nontender. No organomegaly or masses felt. Normal bowel sounds heard. Central nervous system: Alert and oriented. No focal neurological deficits. Extremities: Wasting of muscles of all extremities especially lower extremities.  Right upper extremity at least grade 4 x 5 power.  Left upper extremity proximally grade 2 x 5 power and distally grade 3 x 5 power.  Lower extremities grade 2 x 5 power. Skin: No rashes, lesions or ulcers Psychiatry: Judgement and insight appear normal. Mood & affect appropriate.     Data Reviewed: I have personally reviewed following labs and imaging studies  CBC: Recent Labs  Lab 04/22/18 0140  WBC 15.2*  NEUTROABS 4.5  HGB 13.9  HCT 44.5  MCV 98.7  PLT 973   Basic Metabolic Panel: Recent Labs  Lab  04/21/18 2243  NA 136  K 4.5  CL 97*  CO2 31  GLUCOSE 103*  BUN 15  CREATININE 0.49*  CALCIUM 9.2   No results found for this or any previous visit (from the past 240 hour(s)).       Radiology Studies: Dg  Chest 2 View  Result Date: 04/21/2018 CLINICAL DATA:  84 year old male with productive cough for 1 week. EXAM: CHEST - 2 VIEW COMPARISON:  Chest radiograph dated 11/17/2013 FINDINGS: Evaluation is limited due to severe thoracic dextroscoliosis. There is an area of opacity in the right lower lobe likely representing a small right pleural effusion and associated atelectasis/infiltrate. An area of opacity at the left apex appears similar to prior radiograph. There is no pneumothorax. Stable cardiac silhouette. Degenerative changes of the spine and scoliosis. No acute osseous pathology. IMPRESSION: Findings likely represent a small right pleural effusion and right lower lobe pneumonia. Clinical correlation and follow-up is recommended. Electronically Signed   By: Anner Crete M.D.   On: 04/21/2018 22:51        Scheduled Meds: . buPROPion  150 mg Oral Daily  . dextromethorphan-guaiFENesin  1 tablet Oral BID  . docusate sodium  100 mg Oral Daily  . enoxaparin (LOVENOX) injection  40 mg Subcutaneous Q24H  . metroNIDAZOLE  500 mg Oral Q8H  . multivitamin with minerals  1 tablet Oral Daily  . sertraline  50 mg Oral Daily  . tamsulosin  0.4 mg Oral Daily   Continuous Infusions: . sodium chloride 100 mL/hr at 04/22/18 0218  . azithromycin    . cefTRIAXone (ROCEPHIN)  IV       LOS: 0 days     Vernell Leep, MD, FACP, [facility]. [facility] Pager [phone]  If 7PM-7AM, please contact night-coverage [email or web address] Password [email or web address] 04/22/2018, 9:25 AM

## 2018-04-22 NOTE — ED Notes (Addendum)
IV antibiotics started already prior to Hospitalist order of 2 sets of Blood culture. Dr. Blaine Hamper made aware that IV antibiotics were ordered by EDP without Blood cultures order. Hospitalist notified.

## 2018-04-22 NOTE — ED Provider Notes (Signed)
Medical screening examination/treatment/procedure(s) were conducted as a shared visit with non-physician practitioner(s) and myself.  I personally evaluated the patient during the encounter.  None 84 year old male here with cough and congestion.  X-ray shows pneumonia.  Will admit to the hospitalist service   Lacretia Leigh, MD 04/22/18 [phone]

## 2018-04-23 DIAGNOSIS — J181 Lobar pneumonia, unspecified organism: Secondary | ICD-10-CM

## 2018-04-23 NOTE — Progress Notes (Signed)
PROGRESS NOTE   Manuel Reeves  GDJ:[id number]    DOB: 06/08/1932    DOA: 04/21/2018  PCP: Crist Infante, MD   I have briefly reviewed patients previous medical records in [facility].  Brief Narrative:  84 year old married male, lives with spouse, moves around with the help of a wheelchair, PMH of polio with postpolio syndrome (developed at age 84) with associated quadriparesis, CLL under observation, osteoporosis, anxiety and depression, BPH with urinary retention awaiting prostate surgery 12/30 (Dr. Karsten Ro), presented to [facility] long ED on 04/21/2018 due to productive cough, dyspnea without chest pain, fever or chills and admitted for right lower lobe pneumonia.  Improving.   Assessment & Plan:   Principal Problem:   Aspiration pneumonia ([place]) Active Problems:   CLL (chronic lymphocytic leukemia) (HCC)   BPH (benign prostatic hyperplasia)   Depression with anxiety   Cough   Lobar pneumonia (RLL): Patient denies history of swallowing difficulties.  Likely due to community-acquired pneumonia.  He does have deformed chest wall due to post polio syndrome and scoliosis.  Continue empirically started IV ceftriaxone, azithromycin.  Discontinue Flagyl.  Urine pneumococcal antigen negative, blood cultures: No growth to date.  Speech therapy swallow evaluated and recommended regular diet.  Aggressive pulmonary toilet.  CLL: Under observation by Dr. Alvy Bimler.  Outpatient follow-up.  BPH with urinary retention: Has indwelling Foley catheter and scheduled for surgery 05/09/2018.  Continue Flomax.  Anxiety and depression: Stable without suicidal or homicidal ideations.  Continue Wellbutrin, Zoloft and Ativan.  Stable  Post polio syndrome with quadriplegia: Wheelchair mobile.  Baseline and stable.   DVT prophylaxis: Lovenox Code Status: Full Family Communication: None at bedside Disposition: To home pending clinical improvement, possibly 12/15.   Consultants:  None  Procedures:    None  Antimicrobials:  Ceftriaxone, azithromycin12/12 >.  Discontinued Flagyl   Subjective: Feels much better.  Dyspnea improved.  Minimal dry cough.  No other complaints reported.  ROS: As above  Objective:  Vitals:   04/22/18 1240 04/22/18 1331 04/22/18 2012 04/23/18 0530  BP:  130/64 104/76 137/78  Pulse:  68 83 78  Resp:  (!) 22 18 18   Temp:  97.6 F (36.4 C) 99 F (37.2 C) 97.8 F (36.6 C)  TempSrc:  Oral Oral Oral  SpO2:  100% 100% 100%  Weight: 82.6 kg     Height: 5\' 11"  (1.803 m)       Examination:  General exam: Pleasant elderly male, moderately built and nourished lying comfortably supine in bed. Respiratory system: Deformed chest wall related to polio.  Improved breath sounds in the bases with occasional basal crackles.  Rest of lung fields clear to auscultation. Respiratory effort normal. Cardiovascular system: S1 & S2 heard, RRR. No JVD, murmurs, rubs, gallops or clicks. No pedal edema.  Stable Gastrointestinal system: Abdomen is nondistended, soft and nontender. No organomegaly or masses felt. Normal bowel sounds heard.  Stable Central nervous system: Alert and oriented. No focal neurological deficits.  Stable Extremities: Wasting of muscles of all extremities especially lower extremities.  Right upper extremity at least grade 4 x 5 power.  Left upper extremity proximally grade 2 x 5 power and distally grade 3 x 5 power.  Lower extremities grade 2 x 5 power. Skin: No rashes, lesions or ulcers Psychiatry: Judgement and insight appear normal. Mood & affect appropriate.     Data Reviewed: I have personally reviewed following labs and imaging studies  CBC: Recent Labs  Lab 04/22/18 0140  WBC 15.2*  NEUTROABS  4.5  HGB 13.9  HCT 44.5  MCV 98.7  PLT 629   Basic Metabolic Panel: Recent Labs  Lab 04/21/18 2243  NA 136  K 4.5  CL 97*  CO2 31  GLUCOSE 103*  BUN 15  CREATININE 0.49*  CALCIUM 9.2   Recent Results (from the past 240 hour(s))   Culture, blood (routine x 2) Call MD if unable to obtain prior to antibiotics being given     Status: None (Preliminary result)   Collection Time: 04/22/18  1:40 AM  Result Value Ref Range Status   Specimen Description   Final    BLOOD LEFT HAND Performed at [facility] [address], [place], [place] [address]    Special Requests   Final    BOTTLES DRAWN AEROBIC AND ANAEROBIC Blood Culture adequate volume Performed at [facility] [address], [place], [place] [address]    Culture   Final    NO GROWTH 1 DAY Performed at [facility], [place] [address] [place], [place] [address]    Report Status PENDING  Incomplete  Culture, blood (routine x 2) Call MD if unable to obtain prior to antibiotics being given     Status: None (Preliminary result)   Collection Time: 04/22/18  1:50 AM  Result Value Ref Range Status   Specimen Description   Final    BLOOD RIGHT ARM Performed at [facility] [address] [place], [place] [address]    Special Requests   Final    BOTTLES DRAWN AEROBIC AND ANAEROBIC Blood Culture adequate volume Performed at [facility] [address], [place], [place] [address]    Culture   Final    NO GROWTH 1 DAY Performed at [facility], [place] [address] [place], [place] [address]    Report Status PENDING  Incomplete         Radiology Studies: Dg Chest 2 View  Result Date: 04/21/2018 CLINICAL DATA:  84 year old male with productive cough for 1 week. EXAM: CHEST - 2 VIEW COMPARISON:  Chest radiograph dated 11/17/2013 FINDINGS: Evaluation is limited due to severe thoracic dextroscoliosis. There is an area of opacity in the right lower lobe likely representing a small right pleural effusion and associated atelectasis/infiltrate. An area of opacity at the left apex appears similar to prior radiograph. There is no pneumothorax. Stable cardiac silhouette. Degenerative  changes of the spine and scoliosis. No acute osseous pathology. IMPRESSION: Findings likely represent a small right pleural effusion and right lower lobe pneumonia. Clinical correlation and follow-up is recommended. Electronically Signed   By: Anner Crete M.D.   On: 04/21/2018 22:51        Scheduled Meds: . buPROPion  150 mg Oral Daily  . dextromethorphan-guaiFENesin  1 tablet Oral BID  . docusate sodium  100 mg Oral Daily  . enoxaparin (LOVENOX) injection  40 mg Subcutaneous Q24H  . metroNIDAZOLE  500 mg Oral Q8H  . multivitamin with minerals  1 tablet Oral Daily  . sertraline  50 mg Oral Daily  . tamsulosin  0.4 mg Oral Daily   Continuous Infusions: . sodium chloride 100 mL/hr at 04/23/18 0930  . azithromycin 500 mg (04/22/18 2252)  . cefTRIAXone (ROCEPHIN)  IV 1 g (04/22/18 2118)     LOS: 1 day     Vernell Leep, MD, FACP, [facility]. [facility] Pager [phone]  If 7PM-7AM, please contact night-coverage [email or web address] Password [facility] 04/23/2018, 2:43 PM

## 2018-04-23 NOTE — Progress Notes (Signed)
Unable to wean patient to RA. Patient currently on 1L of oxygen saturating 94-97%. On RA his O2 sats drop to 84%. Will continue to try to wean O2.  Barbee Shropshire. Brigitte Pulse, RN

## 2018-04-24 LAB — CBC
HCT: 41.4 % (ref 39.0–52.0)
Hemoglobin: 13.1 g/dL (ref 13.0–17.0)
MCH: 32.1 pg (ref 26.0–34.0)
MCHC: 31.6 g/dL (ref 30.0–36.0)
MCV: 101.5 fL — AB (ref 80.0–100.0)
Platelets: 281 10*3/uL (ref 150–400)
RBC: 4.08 MIL/uL — ABNORMAL LOW (ref 4.22–5.81)
RDW: 13.5 % (ref 11.5–15.5)
WBC: 13 10*3/uL — ABNORMAL HIGH (ref 4.0–10.5)
nRBC: 0 % (ref 0.0–0.2)

## 2018-04-24 LAB — LEGIONELLA PNEUMOPHILA SEROGP 1 UR AG: L. pneumophila Serogp 1 Ur Ag: NEGATIVE

## 2018-04-24 MED ORDER — CEFDINIR 300 MG PO CAPS: 300.0000 mg | ORAL_CAPSULE | Freq: Two times a day (BID) | ORAL | 0 refills | Status: AC

## 2018-04-24 NOTE — Progress Notes (Signed)
Pt currently on RA with O2 sats at 90-92%. States he does not want to get up to the chair, would prefer to get up to his own wheelchair once his son brings it. Ayvah Caroll, Bing Neighbors, RN

## 2018-04-24 NOTE — Discharge Summary (Signed)
Physician Discharge Summary  Manuel Reeves FAO:[id number] DOB: 23-Oct-1932  PCP: Crist Infante, MD  Admit date: 04/21/2018 Discharge date: 04/24/2018  Recommendations for Outpatient Follow-up:  1. Dr. Crist Infante, PCP in 1 week with repeat labs (CBC & BMP). 2. Recommend repeating chest x-ray in 4 weeks to insure resolution of pneumonia findings.  Home Health: None Equipment/Devices: None  Discharge Condition: Improved and stable CODE STATUS: Full Diet recommendation: Heart healthy diet.  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 Karsten Ro), presented to [facility] long ED on 04/21/2018 due to productive cough, dyspnea without chest pain, fever or chills and admitted for right lower lobe pneumonia.    Assessment & Plan:  Lobar pneumonia (RLL): Patient denies history of swallowing difficulties.  Likely due to community-acquired pneumonia.  He does have deformed chest wall due to post polio syndrome and scoliosis.  Treated empirically started IV ceftriaxone, azithromycin and has completed 3 days course.  Discontinued Flagyl because there was low index of suspicion for aspiration pneumonia.  Urine pneumococcal antigen negative, blood cultures: No growth to date.  Speech therapy swallow evaluated and recommended regular diet.  Aggressive pulmonary toilet.  Clinically improved.  Not hypoxic/not meeting requirements for home oxygen.  Discharged on additional 4 days of Omnicef to complete total 7 days course of  antibiotics.  Recommend repeating chest x-ray in 4 weeks.  CLL: Under observation by Dr. Alvy Bimler.  Outpatient follow-up.  BPH with urinary retention: Has indwelling Foley catheter and scheduled for surgery 05/09/2018.  Continue Flomax.  Anxiety and depression: Stable without suicidal or homicidal ideations.  Continue Wellbutrin, Zoloft and as needed Ativan.  Stable  Post polio syndrome with quadriplegia: Wheelchair mobile.  Baseline and stable.   Consultants:  None  Procedures:  None   Discharge Instructions  Discharge Instructions    Call MD for:  difficulty breathing, headache or visual disturbances   Complete by:  As directed    Call MD for:  extreme fatigue   Complete by:  As directed    Call MD for:  persistant dizziness or light-headedness   Complete by:  As directed    Call MD for:  persistant nausea and vomiting   Complete by:  As directed    Call MD for:  severe uncontrolled pain   Complete by:  As directed    Call MD for:  temperature >100.4   Complete by:  As directed    Diet - low sodium heart healthy   Complete by:  As directed    Increase activity slowly   Complete by:  As directed        Medication List    TAKE these medications   buPROPion 150 MG 12 hr tablet Commonly known as:  WELLBUTRIN SR Take 1 tablet by mouth Daily.   CALTRATE 600+D PO Take 1 tablet by mouth daily.   cefdinir 300 MG capsule Commonly known as:  OMNICEF Take 1 capsule (300 mg total) by mouth 2 (two) times daily for 4 days.   docusate sodium 100 MG capsule Commonly known as:  COLACE Take 100  mg by mouth daily.   ergocalciferol 1.25 MG (50000 UT) capsule Commonly known as:  VITAMIN D2 Take 50,000 Units by mouth every 14 (fourteen) days. On Monday   LORazepam 0.5 MG tablet Commonly known as:  ATIVAN Take 0.5 mg by mouth at bedtime as needed for sleep.   THEREMS-H Tabs Take 1 tablet by mouth daily.   OCUVITE PO Take 1 tablet by mouth daily.   polyethylene  glycol packet Commonly known as:  MIRALAX / GLYCOLAX Take 17 g by mouth daily as needed. What changed:  reasons to take this   sertraline 50 MG tablet Commonly known as:  ZOLOFT Take 50 mg by mouth daily.   tamsulosin 0.4 MG Caps capsule Commonly known as:  FLOMAX Take 1 capsule (0.4 mg total) by mouth daily.   zinc oxide 20 % ointment Apply 1 application topically as needed for irritation. To buttocks after every incontinent episode and as needed for redness. May keep at bedside.      Follow-up Information    Crist Infante, MD. Schedule an appointment as soon as possible for a visit in 1 week(s).   Specialty:  Internal Medicine Why:  To be seen with repeat labs (CBC & BMP). Contact information: [address] [place] [address] [phone]          Allergies  Allergen Reactions  . Penicillins Rash    Has patient had a PCN reaction causing immediate rash, facial/tongue/throat swelling, SOB or lightheadedness with hypotension: Y Has patient had a PCN reaction causing severe rash involving mucus membranes or skin necrosis: N Has patient had a PCN reaction that required hospitalization: N Has patient had a PCN reaction occurring within the last 10 years: N If all of the above answers are "NO", then may proceed with Cephalosporin use.         Procedures/Studies: Dg Chest 2 View  Result Date: 04/21/2018 CLINICAL DATA:  84 year old male with productive cough for 1 week. EXAM: CHEST - 2 VIEW COMPARISON:  Chest radiograph dated 11/17/2013 FINDINGS: Evaluation is limited due to severe thoracic dextroscoliosis. There is an area of opacity in the right lower lobe likely representing a small right pleural effusion and associated atelectasis/infiltrate. An area of opacity at the left apex appears similar to prior radiograph. There is no pneumothorax. Stable cardiac silhouette. Degenerative changes of the spine and scoliosis. No acute osseous pathology. IMPRESSION: Findings  likely represent a small right pleural effusion and right lower lobe pneumonia. Clinical correlation and follow-up is recommended. Electronically Signed   By: Anner Crete M.D.   On: 04/21/2018 22:51      Subjective: Patient states that he feels "fine".  Denies complaints.  No dyspnea on breathing is back to baseline.  No cough.  As per RN, no acute issues noted and oxygen saturations in the 90-92% on room air.  Discharge Exam:  Vitals:   04/23/18 0530 04/23/18 1502 04/23/18 2249 04/24/18 0553  BP: 137/78 123/64 140/75 (!) 156/80  Pulse: 78 87 79 75  Resp: 18 16 20 18   Temp: 97.8 F (36.6 C) 98.3 F (36.8 C) 97.9 F (36.6 C) 97.7 F (36.5 C)  TempSrc: Oral Oral Oral Oral  SpO2: 100% 99% 99% 95%  Weight:      Height:        General exam: Pleasant elderly male, moderately built and nourished lying comfortably supine in bed. Respiratory system: Deformed chest wall related to polio. Occasional basal crackles but otherwise clear to auscultation. Respiratory effort normal.  Cardiovascular system: S1 & S2 heard, RRR. No JVD, murmurs, rubs, gallops or clicks. No pedal edema.   Gastrointestinal system: Abdomen is nondistended, soft and nontender. No organomegaly or masses felt. Normal bowel sounds heard.   Central nervous system: Alert and oriented. No focal neurological deficits. Extremities: Wasting of muscles of all extremities especially lower extremities.  Right upper extremity at least grade 4 x 5 power.  Left upper extremity proximally grade 2 x 5 power and distally grade 3 x 5 power.  Lower extremities grade 2 x 5 power. Skin: No rashes, lesions or ulcers Psychiatry: Judgement and insight appear normal. Mood & affect appropriate.     The results of significant diagnostics from this hospitalization (including imaging, microbiology, ancillary and laboratory) are listed below for reference.     Microbiology: Recent Results (from the past 240 hour(s))  Culture, blood (routine  x 2) Call MD if unable to obtain prior to antibiotics being given     Status: None (Preliminary result)   Collection Time: 04/22/18  1:40 AM  Result Value Ref Range Status   Specimen Description   Final    BLOOD LEFT HAND Performed at [facility] [address] [place], [place] [address]    Special Requests   Final    BOTTLES DRAWN AEROBIC AND ANAEROBIC Blood Culture adequate volume Performed at [facility] [address], [place], [place] [address]    Culture   Final    NO GROWTH 1 DAY Performed at [facility], [place] [address], [place], [place] [address]    Report Status PENDING  Incomplete  Culture, blood (routine x 2) Call MD if unable to obtain prior to antibiotics being given     Status: None (Preliminary result)   Collection Time: 04/22/18  1:50 AM  Result Value Ref Range Status   Specimen Description   Final    BLOOD RIGHT ARM Performed at [facility] [address] [place], [place] [address]    Special Requests   Final    BOTTLES DRAWN AEROBIC AND ANAEROBIC Blood Culture adequate volume Performed at [facility] [address] [place], [place] [address]    Culture   Final    NO GROWTH 1 DAY Performed at [facility], [place] [address] [place], [place] [address]    Report Status PENDING  Incomplete     Labs: CBC: Recent Labs  Lab 04/22/18 0140 04/24/18 0508  WBC 15.2* 13.0*  NEUTROABS 4.5  --   HGB 13.9 13.1  HCT 44.5 41.4  MCV 98.7 101.5*  PLT 345 778   Basic Metabolic Panel: Recent Labs  Lab 04/21/18 2243  NA 136  K 4.5  CL 97*  CO2 31  GLUCOSE 103*  BUN 15  CREATININE 0.49*  CALCIUM 9.2   BNP (last 3 results) Recent Labs    04/21/18 2243  BNP 25.9   I offered to call and speak with patient's family to update care but patient kindly declined indicating that he will update them regarding his hospital care and discharge.   Time coordinating  discharge: 25 minutes  SIGNED:  Vernell Leep, MD, FACP, [facility] Pager [phone]  If 7PM-7AM, please contact night-coverage [email or web address] Password [facility] 04/24/2018, 10:43 AM

## 2018-04-24 NOTE — Discharge Instructions (Signed)

## 2018-04-26 DIAGNOSIS — R339 Retention of urine, unspecified: Secondary | ICD-10-CM | POA: Diagnosis not present

## 2018-04-26 DIAGNOSIS — G14 Postpolio syndrome: Secondary | ICD-10-CM | POA: Diagnosis not present

## 2018-04-26 DIAGNOSIS — F3289 Other specified depressive episodes: Secondary | ICD-10-CM | POA: Diagnosis not present

## 2018-04-26 DIAGNOSIS — J189 Pneumonia, unspecified organism: Secondary | ICD-10-CM | POA: Diagnosis not present

## 2018-04-26 DIAGNOSIS — C9100 Acute lymphoblastic leukemia not having achieved remission: Secondary | ICD-10-CM | POA: Diagnosis not present

## 2018-04-27 LAB — CULTURE, BLOOD (ROUTINE X 2)
Culture: NO GROWTH
Culture: NO GROWTH
Special Requests: ADEQUATE
Special Requests: ADEQUATE

## 2018-04-29 NOTE — Progress Notes (Addendum)
04-22-18 (Epic) EKG  04-21-18 (Epic) CXR  05-02-18 CBC result routed to Dr. Karsten Ro for review.

## 2018-04-29 NOTE — Patient Instructions (Addendum)
BREWER HITCHMAN  04/29/2018   Your procedure is scheduled on: 05-09-18    Report to [facility] Main  Entrance    Report to Admitting at 7:00 AM    Call this number if you have problems the morning of surgery [phone]    Remember: Do not eat food or drink liquids :After Midnight.    BRUSH YOUR TEETH MORNING OF SURGERY AND RINSE YOUR MOUTH OUT, NO CHEWING GUM CANDY OR MINTS.     Take these medicines the morning of surgery with A SIP OF WATER: Bupropion (Wellbutrin), Sertraline (Zoloft) and Tamsulosin (Flomax)                                You may not have any metal on your body including hair pins and              piercings  Do not wear jewelry, cologne, lotions, powders or deodorant             Men may shave face and neck.   Do not bring valuables to the hospital. [place].  Contacts, dentures or bridgework may not be worn into surgery.  Leave suitcase in the car. After surgery it may be brought to your room.     Patients discharged the day of surgery will not be allowed to drive home.  Name and phone number of your driver:  Special Instructions: N/A              Please read over the following fact sheets you were given: _____________________________________________________________________             [facility] - Preparing for Surgery Before surgery, you can play an important role.  Because skin is not sterile, your skin needs to be as free of germs as possible.  You can reduce the number of germs on your skin by washing with CHG (chlorahexidine gluconate) soap before surgery.  CHG is an antiseptic cleaner which kills germs and bonds with the skin to continue killing germs even after washing. Please DO NOT use if you have an allergy to CHG or antibacterial soaps.  If your skin becomes reddened/irritated stop using the CHG and inform your nurse when you arrive at Short Stay. Do not shave  (including legs and underarms) for at least 48 hours prior to the first CHG shower.  You may shave your face/neck. Please follow these instructions carefully:  1.  Shower with CHG Soap the night before surgery and the  morning of Surgery.  2.  If you choose to wash your hair, wash your hair first as usual with your  normal  shampoo.  3.  After you shampoo, rinse your hair and body thoroughly to remove the  shampoo.                           4.  Use CHG as you would any other liquid soap.  You can apply chg directly  to the skin and wash                       Gently with a scrungie or clean washcloth.  5.  Apply the  CHG Soap to your body ONLY FROM THE NECK DOWN.   Do not use on face/ open                           Wound or open sores. Avoid contact with eyes, ears mouth and genitals (private parts).                       Wash face,  Genitals (private parts) with your normal soap.             6.  Wash thoroughly, paying special attention to the area where your surgery  will be performed.  7.  Thoroughly rinse your body with warm water from the neck down.  8.  DO NOT shower/wash with your normal soap after using and rinsing off  the CHG Soap.                9.  Pat yourself dry with a clean towel.            10.  Wear clean pajamas.            11.  Place clean sheets on your bed the night of your first shower and do not  sleep with pets. Day of Surgery : Do not apply any lotions/deodorants the morning of surgery.  Please wear clean clothes to the hospital/surgery center.  FAILURE TO FOLLOW THESE INSTRUCTIONS MAY RESULT IN THE CANCELLATION OF YOUR SURGERY PATIENT SIGNATURE_________________________________  NURSE SIGNATURE__________________________________  ________________________________________________________________________

## 2018-05-02 ENCOUNTER — Encounter (HOSPITAL_COMMUNITY): Payer: Self-pay

## 2018-05-02 ENCOUNTER — Other Ambulatory Visit: Payer: Self-pay

## 2018-05-02 ENCOUNTER — Ambulatory Visit (HOSPITAL_COMMUNITY)
Admission: RE | Admit: 2018-05-02 | Discharge: 2018-05-02 | Disposition: A | Discharge status: Home / Self Care | Payer: Medicare Other | Source: Ambulatory Visit | Attending: Anesthesiology | Admitting: Anesthesiology

## 2018-05-02 ENCOUNTER — Encounter (HOSPITAL_COMMUNITY)
Admission: RE | Admit: 2018-05-02 | Discharge: 2018-05-02 | Disposition: A | Discharge status: Home / Self Care | Payer: Medicare Other | Source: Ambulatory Visit | Attending: Urology | Admitting: Urology

## 2018-05-02 DIAGNOSIS — Z01818 Encounter for other preprocedural examination: Secondary | ICD-10-CM

## 2018-05-02 DIAGNOSIS — R0602 Shortness of breath: Secondary | ICD-10-CM | POA: Diagnosis not present

## 2018-05-02 DIAGNOSIS — G8250 Quadriplegia, unspecified: Secondary | ICD-10-CM | POA: Diagnosis not present

## 2018-05-02 DIAGNOSIS — J189 Pneumonia, unspecified organism: Secondary | ICD-10-CM | POA: Diagnosis not present

## 2018-05-02 DIAGNOSIS — R338 Other retention of urine: Secondary | ICD-10-CM | POA: Diagnosis not present

## 2018-05-02 DIAGNOSIS — J40 Bronchitis, not specified as acute or chronic: Secondary | ICD-10-CM | POA: Diagnosis not present

## 2018-05-02 DIAGNOSIS — J9622 Acute and chronic respiratory failure with hypercapnia: Secondary | ICD-10-CM | POA: Diagnosis not present

## 2018-05-02 DIAGNOSIS — R05 Cough: Secondary | ICD-10-CM | POA: Diagnosis not present

## 2018-05-02 DIAGNOSIS — J9801 Acute bronchospasm: Secondary | ICD-10-CM | POA: Diagnosis not present

## 2018-05-02 DIAGNOSIS — C9110 Chronic lymphocytic leukemia of B-cell type not having achieved remission: Secondary | ICD-10-CM | POA: Diagnosis not present

## 2018-05-02 DIAGNOSIS — J9621 Acute and chronic respiratory failure with hypoxia: Secondary | ICD-10-CM | POA: Diagnosis not present

## 2018-05-02 DIAGNOSIS — E871 Hypo-osmolality and hyponatremia: Secondary | ICD-10-CM | POA: Diagnosis not present

## 2018-05-02 HISTORY — DX: Pneumonia, unspecified organism: J18.9

## 2018-05-02 LAB — CBC
HCT: 46.6 % (ref 39.0–52.0)
Hemoglobin: 15.2 g/dL (ref 13.0–17.0)
MCH: 32.5 pg (ref 26.0–34.0)
MCHC: 32.6 g/dL (ref 30.0–36.0)
MCV: 99.8 fL (ref 80.0–100.0)
Platelets: 327 10*3/uL (ref 150–400)
RBC: 4.67 MIL/uL (ref 4.22–5.81)
RDW: 13.8 % (ref 11.5–15.5)
WBC: 19.1 10*3/uL — ABNORMAL HIGH (ref 4.0–10.5)
nRBC: 0 % (ref 0.0–0.2)

## 2018-05-02 LAB — BASIC METABOLIC PANEL
Anion gap: 8 (ref 5–15)
BUN: 8 mg/dL (ref 8–23)
CO2: 31 mmol/L (ref 22–32)
Calcium: 9.1 mg/dL (ref 8.9–10.3)
Chloride: 94 mmol/L — ABNORMAL LOW (ref 98–111)
Creatinine, Ser: 0.5 mg/dL — ABNORMAL LOW (ref 0.61–1.24)
GFR calc non Af Amer: >60 mL/min (ref >60)
Glucose, Bld: 113 mg/dL — ABNORMAL HIGH (ref 70–99)
Potassium: 5 mmol/L (ref 3.5–5.1)
SODIUM: 133 mmol/L — AB (ref 135–145)

## 2018-05-03 ENCOUNTER — Inpatient Hospital Stay (HOSPITAL_COMMUNITY)
Admission: EM | Admit: 2018-05-03 | Discharge: 2018-05-10 | DRG: 193 | Disposition: A | Discharge status: Home / Self Care | Payer: Medicare Other | Attending: Family Medicine | Admitting: Family Medicine

## 2018-05-03 ENCOUNTER — Other Ambulatory Visit: Payer: Self-pay

## 2018-05-03 ENCOUNTER — Emergency Department (HOSPITAL_COMMUNITY): Payer: Medicare Other

## 2018-05-03 ENCOUNTER — Encounter (HOSPITAL_COMMUNITY): Payer: Self-pay

## 2018-05-03 DIAGNOSIS — F419 Anxiety disorder, unspecified: Secondary | ICD-10-CM | POA: Diagnosis present

## 2018-05-03 DIAGNOSIS — G8250 Quadriplegia, unspecified: Secondary | ICD-10-CM | POA: Diagnosis present

## 2018-05-03 DIAGNOSIS — M419 Scoliosis, unspecified: Secondary | ICD-10-CM | POA: Diagnosis present

## 2018-05-03 DIAGNOSIS — I959 Hypotension, unspecified: Secondary | ICD-10-CM | POA: Diagnosis not present

## 2018-05-03 DIAGNOSIS — G709 Myoneural disorder, unspecified: Secondary | ICD-10-CM | POA: Diagnosis present

## 2018-05-03 DIAGNOSIS — Z981 Arthrodesis status: Secondary | ICD-10-CM

## 2018-05-03 DIAGNOSIS — J9621 Acute and chronic respiratory failure with hypoxia: Secondary | ICD-10-CM | POA: Diagnosis present

## 2018-05-03 DIAGNOSIS — Z9981 Dependence on supplemental oxygen: Secondary | ICD-10-CM

## 2018-05-03 DIAGNOSIS — J9601 Acute respiratory failure with hypoxia: Secondary | ICD-10-CM

## 2018-05-03 DIAGNOSIS — K5900 Constipation, unspecified: Secondary | ICD-10-CM | POA: Diagnosis present

## 2018-05-03 DIAGNOSIS — J811 Chronic pulmonary edema: Secondary | ICD-10-CM | POA: Diagnosis present

## 2018-05-03 DIAGNOSIS — F17200 Nicotine dependence, unspecified, uncomplicated: Secondary | ICD-10-CM | POA: Diagnosis present

## 2018-05-03 DIAGNOSIS — Z8701 Personal history of pneumonia (recurrent): Secondary | ICD-10-CM

## 2018-05-03 DIAGNOSIS — R0902 Hypoxemia: Secondary | ICD-10-CM | POA: Diagnosis not present

## 2018-05-03 DIAGNOSIS — Z7189 Other specified counseling: Secondary | ICD-10-CM | POA: Diagnosis not present

## 2018-05-03 DIAGNOSIS — R0602 Shortness of breath: Secondary | ICD-10-CM | POA: Diagnosis not present

## 2018-05-03 DIAGNOSIS — G9340 Encephalopathy, unspecified: Secondary | ICD-10-CM | POA: Diagnosis not present

## 2018-05-03 DIAGNOSIS — F329 Major depressive disorder, single episode, unspecified: Secondary | ICD-10-CM | POA: Diagnosis present

## 2018-05-03 DIAGNOSIS — G14 Postpolio syndrome: Secondary | ICD-10-CM | POA: Diagnosis present

## 2018-05-03 DIAGNOSIS — C9110 Chronic lymphocytic leukemia of B-cell type not having achieved remission: Secondary | ICD-10-CM | POA: Diagnosis present

## 2018-05-03 DIAGNOSIS — Z66 Do not resuscitate: Secondary | ICD-10-CM | POA: Diagnosis present

## 2018-05-03 DIAGNOSIS — J189 Pneumonia, unspecified organism: Secondary | ICD-10-CM

## 2018-05-03 DIAGNOSIS — N400 Enlarged prostate without lower urinary tract symptoms: Secondary | ICD-10-CM | POA: Diagnosis present

## 2018-05-03 DIAGNOSIS — R05 Cough: Secondary | ICD-10-CM

## 2018-05-03 DIAGNOSIS — E8770 Fluid overload, unspecified: Secondary | ICD-10-CM | POA: Diagnosis present

## 2018-05-03 DIAGNOSIS — J9622 Acute and chronic respiratory failure with hypercapnia: Secondary | ICD-10-CM | POA: Diagnosis present

## 2018-05-03 DIAGNOSIS — Y95 Nosocomial condition: Secondary | ICD-10-CM | POA: Diagnosis present

## 2018-05-03 DIAGNOSIS — E871 Hypo-osmolality and hyponatremia: Secondary | ICD-10-CM

## 2018-05-03 DIAGNOSIS — J449 Chronic obstructive pulmonary disease, unspecified: Secondary | ICD-10-CM

## 2018-05-03 DIAGNOSIS — J9801 Acute bronchospasm: Secondary | ICD-10-CM | POA: Diagnosis present

## 2018-05-03 DIAGNOSIS — C61 Malignant neoplasm of prostate: Secondary | ICD-10-CM | POA: Diagnosis present

## 2018-05-03 DIAGNOSIS — Z4682 Encounter for fitting and adjustment of non-vascular catheter: Secondary | ICD-10-CM | POA: Diagnosis not present

## 2018-05-03 DIAGNOSIS — J440 Chronic obstructive pulmonary disease with acute lower respiratory infection: Secondary | ICD-10-CM | POA: Diagnosis present

## 2018-05-03 DIAGNOSIS — R000 Tachycardia, unspecified: Secondary | ICD-10-CM | POA: Diagnosis not present

## 2018-05-03 DIAGNOSIS — E861 Hypovolemia: Secondary | ICD-10-CM | POA: Diagnosis present

## 2018-05-03 DIAGNOSIS — E875 Hyperkalemia: Secondary | ICD-10-CM | POA: Diagnosis present

## 2018-05-03 DIAGNOSIS — F410 Panic disorder [episodic paroxysmal anxiety] without agoraphobia: Secondary | ICD-10-CM | POA: Diagnosis present

## 2018-05-03 DIAGNOSIS — Z800 Family history of malignant neoplasm of digestive organs: Secondary | ICD-10-CM

## 2018-05-03 DIAGNOSIS — J40 Bronchitis, not specified as acute or chronic: Secondary | ICD-10-CM | POA: Diagnosis not present

## 2018-05-03 DIAGNOSIS — Z9289 Personal history of other medical treatment: Secondary | ICD-10-CM

## 2018-05-03 DIAGNOSIS — J209 Acute bronchitis, unspecified: Secondary | ICD-10-CM | POA: Diagnosis not present

## 2018-05-03 DIAGNOSIS — R310 Gross hematuria: Secondary | ICD-10-CM | POA: Diagnosis present

## 2018-05-03 DIAGNOSIS — M810 Age-related osteoporosis without current pathological fracture: Secondary | ICD-10-CM | POA: Diagnosis present

## 2018-05-03 DIAGNOSIS — R0689 Other abnormalities of breathing: Secondary | ICD-10-CM | POA: Diagnosis not present

## 2018-05-03 DIAGNOSIS — R059 Cough, unspecified: Secondary | ICD-10-CM

## 2018-05-03 DIAGNOSIS — Z880 Allergy status to penicillin: Secondary | ICD-10-CM

## 2018-05-03 LAB — BASIC METABOLIC PANEL
Anion gap: 9 (ref 5–15)
BUN: 9 mg/dL (ref 8–23)
CO2: 30 mmol/L (ref 22–32)
Calcium: 8.7 mg/dL — ABNORMAL LOW (ref 8.9–10.3)
Chloride: 91 mmol/L — ABNORMAL LOW (ref 98–111)
Creatinine, Ser: 0.4 mg/dL — ABNORMAL LOW (ref 0.61–1.24)
GFR calc Af Amer: >60 mL/min (ref >60)
Glucose, Bld: 94 mg/dL (ref 70–99)
Potassium: 5.1 mmol/L (ref 3.5–5.1)
SODIUM: 130 mmol/L — AB (ref 135–145)

## 2018-05-03 LAB — URINALYSIS, ROUTINE W REFLEX MICROSCOPIC
Bacteria, UA: NONE SEEN
Bilirubin Urine: NEGATIVE
Glucose, UA: NEGATIVE mg/dL
Ketones, ur: 80 mg/dL — AB
Nitrite: NEGATIVE
PH: 5 (ref 5.0–8.0)
Protein, ur: 100 mg/dL — AB
RBC / HPF: >50 RBC/hpf — ABNORMAL HIGH (ref 0–5)
Specific Gravity, Urine: 1.02 (ref 1.005–1.030)
WBC, UA: >50 WBC/hpf — ABNORMAL HIGH (ref 0–5)

## 2018-05-03 LAB — BLOOD GAS, ARTERIAL
Acid-Base Excess: 3.5 mmol/L — ABNORMAL HIGH (ref 0.0–2.0)
Bicarbonate: 30.3 mmol/L — ABNORMAL HIGH (ref 20.0–28.0)
Delivery systems: POSITIVE
Drawn by: 257701
EXPIRATORY PAP: 8
FIO2: 50
Inspiratory PAP: 14
Mode: POSITIVE
O2 SAT: 92.7 %
Patient temperature: 98.6
RATE: 8 resp/min
pCO2 arterial: 57.2 mmHg — ABNORMAL HIGH (ref 32.0–48.0)
pH, Arterial: 7.344 — ABNORMAL LOW (ref 7.350–7.450)
pO2, Arterial: 70.1 mmHg — ABNORMAL LOW (ref 83.0–108.0)

## 2018-05-03 LAB — CBC
HCT: 45.2 % (ref 39.0–52.0)
Hemoglobin: 14.6 g/dL (ref 13.0–17.0)
MCH: 31.8 pg (ref 26.0–34.0)
MCHC: 32.3 g/dL (ref 30.0–36.0)
MCV: 98.5 fL (ref 80.0–100.0)
Platelets: 318 10*3/uL (ref 150–400)
RBC: 4.59 MIL/uL (ref 4.22–5.81)
RDW: 13.6 % (ref 11.5–15.5)
WBC: 20.7 10*3/uL — ABNORMAL HIGH (ref 4.0–10.5)
nRBC: 0 % (ref 0.0–0.2)

## 2018-05-03 LAB — INFLUENZA PANEL BY PCR (TYPE A & B)
Influenza A By PCR: NEGATIVE
Influenza B By PCR: NEGATIVE

## 2018-05-03 LAB — BRAIN NATRIURETIC PEPTIDE: B Natriuretic Peptide: 83.9 pg/mL (ref 0.0–100.0)

## 2018-05-03 LAB — TROPONIN I: Troponin I: 0.03 ng/mL (ref <0.03)

## 2018-05-03 LAB — MRSA PCR SCREENING: MRSA by PCR: POSITIVE — AB

## 2018-05-03 MED ORDER — IPRATROPIUM-ALBUTEROL 0.5-2.5 (3) MG/3ML IN SOLN
3.0000 mL | Freq: Once | RESPIRATORY_TRACT | Status: AC
  Administered 2018-05-03: 3 mL via RESPIRATORY_TRACT
  Filled 2018-05-03: qty 3

## 2018-05-03 MED ORDER — MUPIROCIN 2 % EX OINT
1.0000 "application " | TOPICAL_OINTMENT | Freq: Two times a day (BID) | CUTANEOUS | Status: AC
  Administered 2018-05-03 – 2018-05-08 (×10): 1 via NASAL
  Filled 2018-05-03: qty 22

## 2018-05-03 MED ORDER — SODIUM CHLORIDE 0.9 % IV SOLN
2.0000 g | Freq: Once | INTRAVENOUS | Status: AC
  Administered 2018-05-03: 2 g via INTRAVENOUS
  Filled 2018-05-03: qty 2

## 2018-05-03 MED ORDER — LORAZEPAM BOLUS VIA INFUSION: 0.5000 mg | Freq: Four times a day (QID) | INTRAVENOUS | Status: DC | PRN

## 2018-05-03 MED ORDER — SODIUM CHLORIDE 0.9 % IV SOLN
2.0000 g | Freq: Three times a day (TID) | INTRAVENOUS | Status: DC
  Administered 2018-05-04 – 2018-05-08 (×12): 2 g via INTRAVENOUS
  Filled 2018-05-03 (×14): qty 2

## 2018-05-03 MED ORDER — METHYLPREDNISOLONE SODIUM SUCC 125 MG IJ SOLR
125.0000 mg | Freq: Once | INTRAMUSCULAR | Status: AC
  Administered 2018-05-03: 125 mg via INTRAVENOUS
  Filled 2018-05-03: qty 2

## 2018-05-03 MED ORDER — IPRATROPIUM-ALBUTEROL 0.5-2.5 (3) MG/3ML IN SOLN
3.0000 mL | Freq: Three times a day (TID) | RESPIRATORY_TRACT | Status: DC
  Administered 2018-05-04 – 2018-05-10 (×20): 3 mL via RESPIRATORY_TRACT
  Filled 2018-05-03 (×20): qty 3

## 2018-05-03 MED ORDER — SODIUM CHLORIDE 0.9 % IV SOLN
INTRAVENOUS | Status: DC
  Administered 2018-05-03 – 2018-05-08 (×2): via INTRAVENOUS

## 2018-05-03 MED ORDER — ENOXAPARIN SODIUM 40 MG/0.4ML ~~LOC~~ SOLN
40.0000 mg | SUBCUTANEOUS | Status: DC
  Administered 2018-05-03 – 2018-05-09 (×7): 40 mg via SUBCUTANEOUS
  Filled 2018-05-03 (×7): qty 0.4

## 2018-05-03 MED ORDER — VANCOMYCIN HCL 10 G IV SOLR
1250.0000 mg | INTRAVENOUS | Status: DC
  Administered 2018-05-04 – 2018-05-05 (×2): 1250 mg via INTRAVENOUS
  Filled 2018-05-03 (×2): qty 1250

## 2018-05-03 MED ORDER — SERTRALINE HCL 50 MG PO TABS
50.0000 mg | ORAL_TABLET | Freq: Every day | ORAL | Status: DC
  Administered 2018-05-04 – 2018-05-10 (×7): 50 mg via ORAL
  Filled 2018-05-03 (×7): qty 1

## 2018-05-03 MED ORDER — CHLORHEXIDINE GLUCONATE CLOTH 2 % EX PADS
6.0000 | MEDICATED_PAD | Freq: Every day | CUTANEOUS | Status: AC
  Administered 2018-05-05 – 2018-05-07 (×2): 6 via TOPICAL

## 2018-05-03 MED ORDER — DM-GUAIFENESIN ER 30-600 MG PO TB12
1.0000 | ORAL_TABLET | Freq: Two times a day (BID) | ORAL | Status: DC
  Administered 2018-05-03 – 2018-05-10 (×14): 1 via ORAL
  Filled 2018-05-03 (×14): qty 1

## 2018-05-03 MED ORDER — TAMSULOSIN HCL 0.4 MG PO CAPS
0.4000 mg | ORAL_CAPSULE | Freq: Every day | ORAL | Status: DC
  Administered 2018-05-03 – 2018-05-10 (×8): 0.4 mg via ORAL
  Filled 2018-05-03 (×8): qty 1

## 2018-05-03 MED ORDER — LORAZEPAM 2 MG/ML IJ SOLN
0.5000 mg | Freq: Four times a day (QID) | INTRAMUSCULAR | Status: DC | PRN
  Administered 2018-05-03 – 2018-05-08 (×6): 0.5 mg via INTRAVENOUS
  Filled 2018-05-03 (×6): qty 1

## 2018-05-03 MED ORDER — ALBUTEROL (5 MG/ML) CONTINUOUS INHALATION SOLN
10.0000 mg/h | INHALATION_SOLUTION | Freq: Once | RESPIRATORY_TRACT | Status: AC
  Administered 2018-05-03: 10 mg/h via RESPIRATORY_TRACT
  Filled 2018-05-03: qty 20

## 2018-05-03 MED ORDER — VANCOMYCIN HCL 10 G IV SOLR
1500.0000 mg | Freq: Once | INTRAVENOUS | Status: AC
  Administered 2018-05-03: 1500 mg via INTRAVENOUS
  Filled 2018-05-03: qty 1500

## 2018-05-03 MED ORDER — BUPROPION HCL ER (SR) 150 MG PO TB12
150.0000 mg | ORAL_TABLET | Freq: Two times a day (BID) | ORAL | Status: DC
  Administered 2018-05-03 – 2018-05-10 (×14): 150 mg via ORAL
  Filled 2018-05-03 (×14): qty 1

## 2018-05-03 MED ORDER — SODIUM CHLORIDE 0.9 % IV SOLN
100.0000 mg | Freq: Once | INTRAVENOUS | Status: AC
  Administered 2018-05-03: 100 mg via INTRAVENOUS
  Filled 2018-05-03: qty 100

## 2018-05-03 MED ORDER — IPRATROPIUM-ALBUTEROL 0.5-2.5 (3) MG/3ML IN SOLN
3.0000 mL | Freq: Four times a day (QID) | RESPIRATORY_TRACT | Status: DC
  Administered 2018-05-03: 3 mL via RESPIRATORY_TRACT
  Filled 2018-05-03: qty 3

## 2018-05-03 NOTE — ED Triage Notes (Signed)
Per EMS: Pt from friend's home [facility].  Pt was d/c a week ago with pneumonia.  Pt states he took all his ABX, and felt better until today.  Pt has hx of polio.

## 2018-05-03 NOTE — Progress Notes (Signed)
Pharmacy Antibiotic Note  Manuel Reeves is a 84 y.o. male admitted on 05/03/2018 with pneumonia.  Pharmacy has been consulted for vancomycin and cefepime dosing.  Plan:  Cefepime 2g IV q8h  Vancomycin 1500mg  IV x 1, then 1250mg  IV q24h for estimated AUC 433  Check vancomycin levels at steady state, goal AUC 400-500  Follow up renal function & cultures  Height: 5\' 11"  (180.3 cm) Weight: 182 lb 1.6 oz (82.6 kg) IBW/kg (Calculated) : 75.3  Temp (24hrs), Avg:97.4 F (36.3 C), Min:97.4 F (36.3 C), Max:97.4 F (36.3 C)  Recent Labs  Lab 05/02/18 1608 05/03/18 1347  WBC 19.1* 20.7*  CREATININE 0.50* 0.40*    Estimated Creatinine Clearance: 74.5 mL/min (A) (by C-G formula based on SCr of 0.4 mg/dL (L)).    Allergies  Allergen Reactions  . Penicillins Rash    Has patient had a PCN reaction causing immediate rash, facial/tongue/throat swelling, SOB or lightheadedness with hypotension: Y Has patient had a PCN reaction causing severe rash involving mucus membranes or skin necrosis: N Has patient had a PCN reaction that required hospitalization: N Has patient had a PCN reaction occurring within the last 10 years: N If all of the above answers are "NO", then may proceed with Cephalosporin use.       Antimicrobials this admission:  12/24 Vanc >> 12/24 Cefepime >>  Dose adjustments this admission:    Microbiology results:  12/24 Influenza A/B: neg/neg  Thank you for allowing pharmacy to be a part of this patient's care.  Peggyann Juba, PharmD, BCPS Pager: [phone] 05/03/2018 4:45 PM

## 2018-05-03 NOTE — ED Provider Notes (Signed)
[place] DEPT Provider Note   CSN: [id number] Arrival date & time: 05/03/18  1237     History   Chief Complaint Chief Complaint  Patient presents with  . Shortness of Breath    HPI Manuel Reeves is a 84 y.o. male.  HPI Pt presents to the ED for evaluation of shortness of breath.  Pt states he was in the hospital for pneumonia recently.  He is also scheduled for a urological procedure.  Patient had outpatient preop chest x-rays yesterday.  He also had a urine culture at the urologist office.  Throughout the day however he seemed to be weak and fatigued.  Patient today he woke up with congestion.  He did not feel like he could clear the mucus.  He is feeling more short of breath.  No fevers.  No chest pain.  Patient denies any history of chronic lung disease.  He is not on oxygen.  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  He needs to use a wheelchair now for mobility. Past Medical History:  Diagnosis Date  . Anxiety   . CLL (chronic lymphocytic leukemia) ([place]) 02/20/2014  . Osteoporosis   . Pneumonia   . Polio     Patient Active Problem List   Diagnosis Date Noted  . BPH (benign prostatic hyperplasia) 04/22/2018  . Depression with anxiety 04/22/2018  . Cough 04/22/2018  . Aspiration pneumonia ([place]) 04/22/2018  . Leukocytosis 02/25/2018  . 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 Acute kidney failure ([place]) 02/24/2018  . Constipation 02/24/2018  . Hyperkalemia, diminished renal excretion 02/24/2018  . Delirium 02/24/2018  . Acute bilateral obstructive uropathy 02/24/2018  . CLL (chronic lymphocytic leukemia) ([place]) 02/20/2014    Past Surgical History:  Procedure Laterality Date  . CATARACT EXTRACTION  09/2011   at [facility]  . COLONOSCOPY  03/2007  . RETINAL DETACHMENT SURGERY  09/1995  . SPINAL FUSION  10/1952  . TONSILLECTOMY          Home Medications    Prior to Admission medications   Medication Sig Start  Date End Date Taking? Authorizing Provider  buPROPion (WELLBUTRIN SR) 150 MG 12 hr tablet Take 1 tablet by mouth 2 (two) times daily.  02/24/12  Yes [provider]  Calcium Carbonate-Vitamin D (CALTRATE 600+D PO) Take 1 tablet by mouth daily.   Yes [provider]  ergocalciferol (VITAMIN D2) 50000 units capsule Take 50,000 Units by mouth every 14 (fourteen) days. On Monday   Yes [provider]  LORazepam (ATIVAN) 0.5 MG tablet Take 0.5 mg by mouth at bedtime as needed for sleep.   Yes [provider]  Multiple Vitamins-Minerals (OCUVITE PO) Take 1 tablet by mouth daily.   Yes [provider]  Multiple Vitamins-Minerals (THEREMS-H) TABS Take 1 tablet by mouth daily.   Yes [provider]  polyethylene glycol (MIRALAX / GLYCOLAX) packet Take 17 g by mouth daily as needed. Patient taking differently: Take 17 g by mouth daily as needed for mild constipation.  03/31/18  Yes Ngetich, Dinah C, NP  sertraline (ZOLOFT) 50 MG tablet Take 50 mg by mouth daily.   Yes [provider]  tamsulosin (FLOMAX) 0.4 MG CAPS capsule Take 1 capsule (0.4 mg total) by mouth daily. 02/28/18  Yes Shelly Coss, MD  zinc oxide 20 % ointment Apply 1 application topically as needed for irritation. To buttocks after every incontinent episode and as needed for redness. May keep at bedside.  Yes [provider]    Family History Family History  Problem Relation Age of Onset  . Colon cancer Sister 50    Social History Social History   Tobacco Use  . Smoking status: Light Tobacco Smoker    Years: 25.00  . Smokeless tobacco: Never Used  . Tobacco comment: Less than a pack a week  Substance Use Topics  . Alcohol use: Yes    Comment: occasional beer or wine manybe monthly  . Drug use: No     Allergies   Penicillins   Review of Systems Review of Systems  All other systems reviewed and are negative.    Physical Exam Updated Vital  Signs BP 134/75   Pulse 88 Comment: Simultaneous filing. User may not have seen previous data.  Temp (!) 97.4 F (36.3 C) (Oral)   Resp (!) 22 Comment: Simultaneous filing. User may not have seen previous data.  Ht 1.803 m (5\' 11" )   Wt 82.6 kg   SpO2 96% Comment: Simultaneous filing. User may not have seen previous data.  BMI 25.40 kg/m   Physical Exam Vitals signs and nursing note reviewed.  Constitutional:      General: He is not in acute distress.    Appearance: He is not toxic-appearing or diaphoretic.  HENT:     Head: Normocephalic and atraumatic.     Right Ear: External ear normal.     Left Ear: External ear normal.  Eyes:     General: No scleral icterus.       Right eye: No discharge.        Left eye: No discharge.     Conjunctiva/sclera: Conjunctivae normal.  Neck:     Musculoskeletal: Neck supple.     Trachea: No tracheal deviation.  Cardiovascular:     Rate and Rhythm: Normal rate and regular rhythm.  Pulmonary:     Effort: Pulmonary effort is normal. No respiratory distress.     Breath sounds: No stridor. Decreased breath sounds, rhonchi and rales present. No wheezing.  Abdominal:     General: Bowel sounds are normal. There is no distension.     Palpations: Abdomen is soft.     Tenderness: There is no abdominal tenderness. There is no guarding or rebound.  Musculoskeletal:        General: No tenderness.     Right lower leg: Edema present.     Left lower leg: Edema present.  Skin:    General: Skin is warm and dry.     Findings: No rash.  Neurological:     Mental Status: He is alert.     Cranial Nerves: No cranial nerve deficit (no facial droop, extraocular movements intact, no slurred speech).     Sensory: No sensory deficit.     Motor: Weakness and abnormal muscle tone present. No seizure activity.      ED Treatments / Results  Labs (all labs ordered are listed, but only abnormal results are displayed) Labs Reviewed  BASIC METABOLIC PANEL -  Abnormal; Notable for the following components:      Result Value   Sodium 130 (*)    Chloride 91 (*)    Creatinine, Ser 0.40 (*)    Calcium 8.7 (*)    All other components within normal limits  CBC - Abnormal; Notable for the following components:   WBC 20.7 (*)    All other components within normal limits  TROPONIN I - Abnormal; Notable for the following components:   Troponin  I 0.03 (*)    All other components within normal limits  BLOOD GAS, ARTERIAL - Abnormal; Notable for the following components:   pH, Arterial 7.344 (*)    pCO2 arterial 57.2 (*)    pO2, Arterial 70.1 (*)    Bicarbonate 30.3 (*)    Acid-Base Excess 3.5 (*)    All other components within normal limits  BRAIN NATRIURETIC PEPTIDE  INFLUENZA PANEL BY PCR (TYPE A & B)  URINALYSIS, ROUTINE W REFLEX MICROSCOPIC    EKG Sinus rhythm, rate 89 Left atrial enlargement Probable anteroseptal infarct, old  Radiology Dg Chest 2 View  Result Date: 05/03/2018 CLINICAL DATA:  Preop.  Recent pneumonia.  Cough. EXAM: CHEST - 2 VIEW COMPARISON:  04/21/2018 FINDINGS: Severe thoracolumbar scoliosis and deformity of the ribcage, limiting study. Very difficult to evaluate lung bases. There appear to be increased densities at both lung bases, similar prior study, but this may be related to thoracic deformity rather than true infiltrate or effusion. IMPRESSION: Limited study due to severe thoracolumbar scoliosis and thoracic cage deformity. Persistent apparent increased densities at both lung bases may be related to thoracic deformity rather than true abnormality. Electronically Signed   By: Rolm Baptise M.D.   On: 05/03/2018 08:19   Dg Chest Port 1 View  Result Date: 05/03/2018 CLINICAL DATA:  Shortness of breath. EXAM: PORTABLE CHEST 1 VIEW COMPARISON:  05/02/2018. FINDINGS: The cardio pericardial silhouette is enlarged. Marked thoracolumbar scoliosis distorts cardio thoracic anatomy. No evidence for pulmonary edema. Haziness  overlying the lung bases is stable and may be related to chest morphology. Atelectasis or infiltrate at the base or small effusion can not be excluded. Bones are diffusely demineralized. Telemetry leads overlie the chest. IMPRESSION: Stable exam. Marked thoracolumbar scoliosis distorts cardio thoracic anatomy limiting assessment of the lung bases. Electronically Signed   By: Misty Stanley M.D.   On: 05/03/2018 14:13    Procedures .Critical Care Performed by: Dorie Rank, MD Authorized by: Dorie Rank, MD   Critical care provider statement:    Critical care time (minutes):  45   Critical care was time spent personally by me on the following activities:  Discussions with consultants, evaluation of patient's response to treatment, examination of patient, ordering and performing treatments and interventions, ordering and review of laboratory studies, ordering and review of radiographic studies, pulse oximetry, re-evaluation of patient's condition, obtaining history from patient or surrogate and review of old charts   (including critical care time)  Medications Ordered in ED Medications  methylPREDNISolone sodium succinate (SOLU-MEDROL) 125 mg/2 mL injection 125 mg (has no administration in time range)  doxycycline (VIBRAMYCIN) 100 mg in sodium chloride 0.9 % 250 mL IVPB (has no administration in time range)  ipratropium-albuterol (DUONEB) 0.5-2.5 (3) MG/3ML nebulizer solution 3 mL (3 mLs Nebulization Given 05/03/18 1349)  albuterol (PROVENTIL,VENTOLIN) solution continuous neb (10 mg/hr Nebulization Given 05/03/18 1610)     Initial Impression / Assessment and Plan / ED Course  I have reviewed the triage vital signs and the nursing notes.  Pertinent labs & imaging results that were available during my care of the patient were reviewed by me and considered in my medical decision making (see chart for details).  Clinical Course as of May 03 1609  Tue May 03, 2018  1505 Pt is feeling more short of  breath.  Had to change to non rebreather.  Will check abg.  STart bipap.  Pt will need admission.   [CN]  4709 CXR without pna.  WBC elevated.  ?occult pna vs bonchitis, bronchospasm   [JK]  1510 Patient appears more short of breath.  He does have labored breathing.  I will start BiPAP   [JK]  1601 Patient is feeling better now after BiPAP.  He is oxygen saturation is in the high 90s now.  He is breathing more easily.  Vital signs are much improved   [JK]  1610 Troponin 0.03 but I doubt this is clinically significant.  We will continue to monitor.  Influenza is negative.  BNP is normal.  No signs of congestive heart failure.   [JK]    Clinical Course User Index [JK] Dorie Rank, MD   Patient presented to the emergency room for shortness of breath.  On exam the patient had significant bout of rhonchi and congestion.  Patient was treated with breathing treatments and steroids.  He unfortunately continued to remain tachypneic and had more labored breathing.  Patient was changed to BiPAP.  He had significant improvement in his breathing.  Patient is much more comfortable now.  I suspect his symptoms are related to infection and bronchospasm.  I will start him on doxycycline although there is no definite pneumonia on x-ray.  Plan on admission to hospital for further treatment   Final Clinical Impressions(s) / ED Diagnoses   Final diagnoses:  Bronchitis with bronchospasm     Dorie Rank, MD 05/03/18 1610

## 2018-05-03 NOTE — H&P (Signed)
Marland KitchenMarland KitchenMarland Kitchen....  History and Physical    Manuel Reeves BJS:[id number] DOB: 29-Jul-1932 DOA: 05/03/2018  PCP: Crist Infante, MD Patient coming from: Home  Chief Complaint: Shortness of breath and cough  HPI: Manuel Reeves is a 84 y.o. male with medical history significant of CLL no active treatment, osteoporosis, anxiety, depression, BPH with urinary retention awaiting prostate surgery 1230 Dr. Karsten Ro post polio syndrome associated with quadriparesis admitted with cough and increasing shortness of breath for the last few days which has been progressively getting worse.  His appetite had been diminished and family reports that he is more fatigued than before.  He moves around in a motorized scooter at home.  Family reports he does not mount a fever most of the time because of his immunosuppressant state.  Denies any nausea vomiting had diarrhea 2 days ago but has not had any since then.  He was recently admitted to the hospital and discharged on December 12.  At that time he was also diagnosed with pneumonia and discharged home on antibiotics.  He notices that he is progressively getting worse he is not able to shrug his shoulders anymore with the post polio is overall strength and breathing is getting worse.  He does not have oxygen at home.  His urologist is Dr. Karsten Ro.  He was due to go for his surgery on Monday.  Currently he has gross hematuria.  He has a chronic Foley for the last 6 years.  Denies any chest pain. Wife reports that he takes diazepam at home for sleep and anxiety.  Few days ago he had a panic attack where he thought he was going to die.   ED Course: He was placed on BiPAP since he became very hypoxic to the 70s.  He got nebulizer treatments and doxycycline and prednisone in the ER.  His VBG was pH 7.34 PCO2 57 PO2 70 on 50% FiO2.  Sodium 130 potassium 5.1 BUN 9 creatinine 0.40 troponin was 0.03 BNP was 83 white count 20.7 hemoglobin 14.6 platelet count 318.  Influenza a and B  was negative.  Chest x-ray showed no acute changes.  Chest x-ray was unremarkable.  Review of Systems: As per HPI otherwise all other systems reviewed and are negative  Ambulatory Status:moves around in motorized scooter  Past Medical History:  Diagnosis Date  . Anxiety   . CLL (chronic lymphocytic leukemia) ([place]) 02/20/2014  . Osteoporosis   . Pneumonia   . Polio     Past Surgical History:  Procedure Laterality Date  . CATARACT EXTRACTION  09/2011   at [facility]  . COLONOSCOPY  03/2007  . RETINAL DETACHMENT SURGERY  09/1995  . SPINAL FUSION  10/1952  . TONSILLECTOMY      Social History   Socioeconomic History  . Marital status: Married    Spouse name: Not on file  . Number of children: Not on file  . Years of education: Not on file  . Highest education level: Not on file  Occupational History  . Not on file  Social Needs  . Financial resource strain: Not on file  . Food insecurity:    Worry: Not on file    Inability: Not on file  . Transportation needs:    Medical: Not on file    Non-medical: Not on file  Tobacco Use  . Smoking status: Light Tobacco Smoker    Years: 25.00  . Smokeless tobacco: Never Used  . Tobacco comment: Less than a pack a week  Substance and Sexual Activity  . Alcohol use: Yes    Comment: occasional beer or wine manybe monthly  . Drug use: No  . Sexual activity: Not on file  Lifestyle  . Physical activity:    Days per week: Not on file    Minutes per session: Not on file  . Stress: Not on file  Relationships  . Social connections:    Talks on phone: Not on file    Gets together: Not on file    Attends religious service: Not on file    Active member of club or organization: Not on file    Attends meetings of clubs or organizations: Not on file    Relationship status: Not on file  . Intimate partner violence:    Fear of current or ex partner: Not on file    Emotionally abused: Not on file    Physically abused: Not on file     Forced sexual activity: Not on file  Other Topics Concern  . Not on file  Social History Narrative  . Not on file    Allergies  Allergen Reactions  . Penicillins Rash    Has patient had a PCN reaction causing immediate rash, facial/tongue/throat swelling, SOB or lightheadedness with hypotension: Y Has patient had a PCN reaction causing severe rash involving mucus membranes or skin necrosis: N Has patient had a PCN reaction that required hospitalization: N Has patient had a PCN reaction occurring within the last 10 years: N If all of the above answers are "NO", then may proceed with Cephalosporin use.       Family History  Problem Relation Age of Onset  . Colon cancer Sister 34     Prior to Admission medications   Medication Sig Start Date End Date Taking? Authorizing Provider  buPROPion (WELLBUTRIN SR) 150 MG 12 hr tablet Take 1 tablet by mouth 2 (two) times daily.  02/24/12  Yes [provider]  Calcium Carbonate-Vitamin D (CALTRATE 600+D PO) Take 1 tablet by mouth daily.   Yes [provider]  ergocalciferol (VITAMIN D2) 50000 units capsule Take 50,000 Units by mouth every 14 (fourteen) days. On Monday   Yes [provider]  LORazepam (ATIVAN) 0.5 MG tablet Take 0.5 mg by mouth at bedtime as needed for sleep.   Yes [provider]  Multiple Vitamins-Minerals (OCUVITE PO) Take 1 tablet by mouth daily.   Yes [provider]  Multiple Vitamins-Minerals (THEREMS-H) TABS Take 1 tablet by mouth daily.   Yes [provider]  polyethylene glycol (MIRALAX / GLYCOLAX) packet Take 17 g by mouth daily as needed. Patient taking differently: Take 17 g by mouth daily as needed for mild constipation.  03/31/18  Yes Ngetich, Dinah C, NP  sertraline (ZOLOFT) 50 MG tablet Take 50 mg by mouth daily.   Yes [provider]  tamsulosin (FLOMAX) 0.4 MG CAPS capsule Take 1 capsule (0.4 mg total) by mouth daily. 02/28/18  Yes Shelly Coss,  MD  zinc oxide 20 % ointment Apply 1 application topically as needed for irritation. To buttocks after every incontinent episode and as needed for redness. May keep at bedside.   Yes [provider]    Physical Exam: Vitals:   05/03/18 1555 05/03/18 1556 05/03/18 1557 05/03/18 1558  BP:    134/75  Pulse: 87 88 92 88  Resp: 18 19 (!) 27 (!) 22  Temp:      TempSrc:      SpO2: 98% 98% 97%  96%  Weight:      Height:         . General: Appears calm and comfortable . Eyes: PERRL, EOMI, normal lids, iris . ENT:  grossly normal hearing, lips & tongue, mmm . Neck:  no LAD, masses or thyromegaly . Cardiovascular: RRR, no m/r/g. No LE edema.  Marland Kitchen Respiratory: coarse  bilaterally, no w/r/r. Normal respiratory effort. . Abdomen: soft, ntnd, NABS . Skin:  no rash or induration seen on limited exam . Musculoskeletal:decreased tone . Psychiatric: grossly normal mood and affect, speech fluent and appropriate, AOx3  Labs on Admission: I have personally reviewed following labs and imaging studies  CBC: Recent Labs  Lab 05/02/18 1608 05/03/18 1347  WBC 19.1* 20.7*  HGB 15.2 14.6  HCT 46.6 45.2  MCV 99.8 98.5  PLT 327 932   Basic Metabolic Panel: Recent Labs  Lab 05/02/18 1608 05/03/18 1347  NA 133* 130*  K 5.0 5.1  CL 94* 91*  CO2 31 30  GLUCOSE 113* 94  BUN 8 9  CREATININE 0.50* 0.40*  CALCIUM 9.1 8.7*   GFR: Estimated Creatinine Clearance: 74.5 mL/min (A) (by C-G formula based on SCr of 0.4 mg/dL (L)). Liver Function Tests: No results for input(s): AST, ALT, ALKPHOS, BILITOT, PROT, ALBUMIN in the last 168 hours. No results for input(s): LIPASE, AMYLASE in the last 168 hours. No results for input(s): AMMONIA in the last 168 hours. Coagulation Profile: No results for input(s): INR, PROTIME in the last 168 hours. Cardiac Enzymes: Recent Labs  Lab 05/03/18 1347  TROPONINI 0.03*   BNP (last 3 results) No results for input(s): PROBNP in the last 8760  hours. HbA1C: No results for input(s): HGBA1C in the last 72 hours. CBG: No results for input(s): GLUCAP in the last 168 hours. Lipid Profile: No results for input(s): CHOL, HDL, LDLCALC, TRIG, CHOLHDL, LDLDIRECT in the last 72 hours. Thyroid Function Tests: No results for input(s): TSH, T4TOTAL, FREET4, T3FREE, THYROIDAB in the last 72 hours. Anemia Panel: No results for input(s): VITAMINB12, FOLATE, FERRITIN, TIBC, IRON, RETICCTPCT in the last 72 hours. Urine analysis:    Component Value Date/Time   COLORURINE YELLOW 02/24/2018 1733   APPEARANCEUR CLEAR 02/24/2018 1733   LABSPEC 1.013 02/24/2018 1733   PHURINE 6.0 02/24/2018 1733   GLUCOSEU NEGATIVE 02/24/2018 1733   HGBUR SMALL (A) 02/24/2018 1733   BILIRUBINUR NEGATIVE 02/24/2018 1733   KETONESUR NEGATIVE 02/24/2018 1733   PROTEINUR NEGATIVE 02/24/2018 1733   NITRITE NEGATIVE 02/24/2018 1733   LEUKOCYTESUR NEGATIVE 02/24/2018 1733    Creatinine Clearance: Estimated Creatinine Clearance: 74.5 mL/min (A) (by C-G formula based on SCr of 0.4 mg/dL (L)).  Sepsis Labs: @LABRCNTIP (procalcitonin:4,lacticidven:4) )No results found for this or any previous visit (from the past 240 hour(s)).   Radiological Exams on Admission: Dg Chest 2 View  Result Date: 05/03/2018 CLINICAL DATA:  Preop.  Recent pneumonia.  Cough. EXAM: CHEST - 2 VIEW COMPARISON:  04/21/2018 FINDINGS: Severe thoracolumbar scoliosis and deformity of the ribcage, limiting study. Very difficult to evaluate lung bases. There appear to be increased densities at both lung bases, similar prior study, but this may be related to thoracic deformity rather than true infiltrate or effusion. IMPRESSION: Limited study due to severe thoracolumbar scoliosis and thoracic cage deformity. Persistent apparent increased densities at both lung bases may be related to thoracic deformity rather than true abnormality. Electronically Signed   By: Rolm Baptise M.D.   On: 05/03/2018 08:19   Dg  Chest [facility]  Result Date: 05/03/2018 CLINICAL DATA:  Shortness of breath. EXAM: PORTABLE CHEST 1 VIEW COMPARISON:  05/02/2018. FINDINGS: The cardio pericardial silhouette is enlarged. Marked thoracolumbar scoliosis distorts cardio thoracic anatomy. No evidence for pulmonary edema. Haziness overlying the lung bases is stable and may be related to chest morphology. Atelectasis or infiltrate at the base or small effusion can not be excluded. Bones are diffusely demineralized. Telemetry leads overlie the chest. IMPRESSION: Stable exam. Marked thoracolumbar scoliosis distorts cardio thoracic anatomy limiting assessment of the lung bases. Electronically Signed   By: Misty Stanley M.D.   On: 05/03/2018 14:13    EKG: Sinus rhythm  Assessment/Plan Active Problems:   Pneumonia  Estimated body mass index is 25.4 kg/m as calculated from the following:   Height as of this encounter: 5\' 11"  (1.803 m).   Weight as of this encounter: 82.6 kg.    #1 Healthcare associated pneumonia patient admitted to the hospital recently with the same.  He was now readmitted with shortness of breath and cough and increasing generalized weakness.  We will treat him with Vanco and cefepime for now narrow antibiotics as possible.  Follow-up chest x-ray tomorrow after hydration.  Repeat ABG after 4 to 5 hours.  #2 gross hematuria patient unable to tell me if he has any urinary complaints other than hematuria.  He has a chronic Foley catheter at home over 6 years.  And he has post polio syndrome.  Will obtain urine culture.  #3 hyponatremia secondary to hypovolemia and diminished intake   #4 CLL chronic leukocytosis followed by Dr. Simeon Craft such  #5 BPH continue Flomax continue Foley care  #6 anxiety and depression patient takes Wellbutrin so the Zoloft and Ativan which will be continued as soon as he is able to take p.o.   #7 post polio syndrome with quadriparesis patient has motorized scooter at home   DVT  prophylaxis Lovenox  CODE STATUS full code for now discussed with patient's son and wife and patient.  Family communication discussed with family  Disposition plan pending clinical improvement  Consults called none   Admission status inpatient  Georgette Shell MD [facility]  If 7PM-7AM, please contact night-coverage [email or web address] Password [facility]  05/03/2018, 4:34 PM

## 2018-05-03 NOTE — ED Notes (Signed)
Called respiratory to help transport this pt to the floor

## 2018-05-03 NOTE — ED Notes (Signed)
ED TO INPATIENT HANDOFF REPORT  Name/Age/Gender Manuel Reeves 84 y.o. male  Code Status    Code Status Orders  (From admission, onward)         Start     Ordered   05/03/18 1634  Full code  Continuous     05/03/18 1633        Code Status History    Date Active Date Inactive Code Status Order ID Comments User Context   04/22/2018 0043 04/24/2018 1618 Full Code [id number]  Ivor Costa, MD ED   02/24/2018 2224 02/27/2018 1917 Full Code [id number]  Etta Quill, DO ED    Advance Directive Documentation     Most Recent Value  Type of Advance Directive  Healthcare Power of Attorney  Pre-existing out of facility DNR order (yellow form or pink MOST form)  -  "MOST" Form in Place?  -      Home/SNF/Other Skilled nursing facility  Chief Complaint SOB   Level of Care/Admitting Diagnosis ED Disposition    ED Disposition Condition [place]: [facility] [100102]  Level of Care: Stepdown [14]  Admit to SDU based on following criteria: Respiratory Distress:  Frequent assessment and/or intervention to maintain adequate ventilation/respiration, pulmonary toilet, and respiratory treatment.  Diagnosis: Pneumonia [227785]  Admitting Physician: Georgette Shell [[id number]]  Attending Physician: Georgette Shell [[id number]]  Estimated length of stay: 3 - 4 days  Certification:: I certify this patient will need inpatient services for at least 2 midnights  PT Class (Do Not Modify): Inpatient [101]  PT Acc Code (Do Not Modify): Private [1]       Medical History Past Medical History:  Diagnosis Date  . Anxiety   . CLL (chronic lymphocytic leukemia) ([place]) 02/20/2014  . Osteoporosis   . Pneumonia   . Polio     Allergies Allergies  Allergen Reactions  . Penicillins Rash    Has patient had a PCN reaction causing immediate rash, facial/tongue/throat swelling, SOB or lightheadedness with hypotension: Y Has patient had a PCN  reaction causing severe rash involving mucus membranes or skin necrosis: N Has patient had a PCN reaction that required hospitalization: N Has patient had a PCN reaction occurring within the last 10 years: N If all of the above answers are "NO", then may proceed with Cephalosporin use.       IV Location/Drains/Wounds Patient Lines/Drains/Airways Status   Active Line/Drains/Airways    Name:   Placement date:   Placement time:   Site:   Days:   Peripheral IV 05/03/18 Left Forearm   05/03/18    -    Forearm   less than 1   Urethral Catheter [place], RN 16 Fr.   02/26/18    0048    -   94          Labs/Imaging Results for orders placed or performed during the hospital encounter of 05/03/18 (from the past 48 hour(s))  Influenza panel by PCR (type A & B)     Status: None   Collection Time: 05/03/18  1:29 PM  Result Value Ref Range   Influenza A By PCR NEGATIVE NEGATIVE   Influenza B By PCR NEGATIVE NEGATIVE    Comment: (NOTE) The Xpert Xpress Flu assay is intended as an aid in the diagnosis of  influenza and should not be used as a sole basis for treatment.  This  assay is FDA approved for nasopharyngeal swab specimens only. Nasal  washings and aspirates are unacceptable for Xpert Xpress Flu testing. Performed at [facility], [place] [address] [place], [place] [address]   Basic metabolic panel     Status: Abnormal   Collection Time: 05/03/18  1:47 PM  Result Value Ref Range   Sodium 130 (L) 135 - 145 mmol/L   Potassium 5.1 3.5 - 5.1 mmol/L   Chloride 91 (L) 98 - 111 mmol/L   CO2 30 22 - 32 mmol/L   Glucose, Bld 94 70 - 99 mg/dL   BUN 9 8 - 23 mg/dL   Creatinine, Ser 0.40 (L) 0.61 - 1.24 mg/dL   Calcium 8.7 (L) 8.9 - 10.3 mg/dL   GFR calc non Af Amer >60 >60 mL/min   GFR calc Af Amer >60 >60 mL/min   Anion gap 9 5 - 15    Comment: Performed at [facility], [place] [address], [place], [place] [address]  CBC     Status: Abnormal    Collection Time: 05/03/18  1:47 PM  Result Value Ref Range   WBC 20.7 (H) 4.0 - 10.5 K/uL   RBC 4.59 4.22 - 5.81 MIL/uL   Hemoglobin 14.6 13.0 - 17.0 g/dL   HCT 45.2 39.0 - 52.0 %   MCV 98.5 80.0 - 100.0 fL   MCH 31.8 26.0 - 34.0 pg   MCHC 32.3 30.0 - 36.0 g/dL   RDW 13.6 11.5 - 15.5 %   Platelets 318 150 - 400 K/uL   nRBC 0.0 0.0 - 0.2 %    Comment: Performed at [facility], [place] [address], [place], [place] [address]  Brain natriuretic peptide     Status: None   Collection Time: 05/03/18  1:47 PM  Result Value Ref Range   B Natriuretic Peptide 83.9 0.0 - 100.0 pg/mL    Comment: Performed at [facility], [place] [address], [place], [place] [address]  Troponin I - ONCE - STAT     Status: Abnormal   Collection Time: 05/03/18  1:47 PM  Result Value Ref Range   Troponin I 0.03 (HH) <0.03 ng/mL    Comment: CRITICAL RESULT CALLED TO, READ BACK BY AND VERIFIED WITH: M. BRILL AT 1520 ON 05/03/18 BY N.THOMPSON Performed at [facility], [place] [address], [place], [place] [address]   Blood gas, arterial ([facility] & AP ONLY)     Status: Abnormal   Collection Time: 05/03/18  3:37 PM  Result Value Ref Range   FIO2 50.00    Delivery systems BILEVEL POSITIVE AIRWAY PRESSURE    Mode BILEVEL POSITIVE AIRWAY PRESSURE    LHR 8 resp/min   Inspiratory PAP 14.0    Expiratory PAP 8.0    pH, Arterial 7.344 (L) 7.350 - 7.450   pCO2 arterial 57.2 (H) 32.0 - 48.0 mmHg   pO2, Arterial 70.1 (L) 83.0 - 108.0 mmHg   Bicarbonate 30.3 (H) 20.0 - 28.0 mmol/L   Acid-Base Excess 3.5 (H) 0.0 - 2.0 mmol/L   O2 Saturation 92.7 %   Patient temperature 98.6    Collection site RIGHT BRACHIAL    Drawn by [id number]    Sample type ARTERIAL DRAW     Comment: Performed at [facility], [place] [address] [place], [place] [address]   Dg Chest 2 View  Result Date: 05/03/2018 CLINICAL DATA:  Preop.  Recent pneumonia.  Cough. EXAM: CHEST - 2 VIEW  COMPARISON:  04/21/2018 FINDINGS: Severe thoracolumbar scoliosis and deformity of the  ribcage, limiting study. Very difficult to evaluate lung bases. There appear to be increased densities at both lung bases, similar prior study, but this may be related to thoracic deformity rather than true infiltrate or effusion. IMPRESSION: Limited study due to severe thoracolumbar scoliosis and thoracic cage deformity. Persistent apparent increased densities at both lung bases may be related to thoracic deformity rather than true abnormality. Electronically Signed   By: Rolm Baptise M.D.   On: 05/03/2018 08:19   Dg Chest Port 1 View  Result Date: 05/03/2018 CLINICAL DATA:  Shortness of breath. EXAM: PORTABLE CHEST 1 VIEW COMPARISON:  05/02/2018. FINDINGS: The cardio pericardial silhouette is enlarged. Marked thoracolumbar scoliosis distorts cardio thoracic anatomy. No evidence for pulmonary edema. Haziness overlying the lung bases is stable and may be related to chest morphology. Atelectasis or infiltrate at the base or small effusion can not be excluded. Bones are diffusely demineralized. Telemetry leads overlie the chest. IMPRESSION: Stable exam. Marked thoracolumbar scoliosis distorts cardio thoracic anatomy limiting assessment of the lung bases. Electronically Signed   By: Misty Stanley M.D.   On: 05/03/2018 14:13    Pending Labs Unresulted Labs (From admission, onward)    Start     Ordered   05/04/18 0500  CBC  Tomorrow morning,   R     05/03/18 1636   05/04/18 0500  Comprehensive metabolic panel  Tomorrow morning,   R     05/03/18 1636   05/03/18 1328  Urinalysis, Routine w reflex microscopic  Once,   R     05/03/18 1329          Vitals/Pain Today's Vitals   05/03/18 1615 05/03/18 1630 05/03/18 1645 05/03/18 1700  BP:  126/67  118/71  Pulse: 85 91 85 89  Resp: 18 (!) 24 19 (!) 21  Temp:      TempSrc:      SpO2: 97% 99% 99% 98%  Weight:      Height:      PainSc:        Isolation  Precautions Droplet precaution  Medications Medications  doxycycline (VIBRAMYCIN) 100 mg in sodium chloride 0.9 % 250 mL IVPB (100 mg Intravenous New Bag/Given 05/03/18 1632)  enoxaparin (LOVENOX) injection 40 mg (has no administration in time range)  ipratropium-albuterol (DUONEB) 0.5-2.5 (3) MG/3ML nebulizer solution 3 mL (has no administration in time range)  dextromethorphan-guaiFENesin (MUCINEX DM) 30-600 MG per 12 hr tablet 1 tablet (has no administration in time range)  tamsulosin (FLOMAX) capsule 0.4 mg (has no administration in time range)  buPROPion (WELLBUTRIN SR) 12 hr tablet 150 mg (has no administration in time range)  sertraline (ZOLOFT) tablet 50 mg (has no administration in time range)  vancomycin (VANCOCIN) 1,500 mg in sodium chloride 0.9 % 500 mL IVPB (has no administration in time range)  ceFEPIme (MAXIPIME) 2 g in sodium chloride 0.9 % 100 mL IVPB (has no administration in time range)  ceFEPIme (MAXIPIME) 2 g in sodium chloride 0.9 % 100 mL IVPB (has no administration in time range)  vancomycin (VANCOCIN) 1,250 mg in sodium chloride 0.9 % 250 mL IVPB (has no administration in time range)  ipratropium-albuterol (DUONEB) 0.5-2.5 (3) MG/3ML nebulizer solution 3 mL (3 mLs Nebulization Given 05/03/18 1349)  albuterol (PROVENTIL,VENTOLIN) solution continuous neb (10 mg/hr Nebulization Given 05/03/18 1610)  methylPREDNISolone sodium succinate (SOLU-MEDROL) 125 mg/2 mL injection 125 mg (125 mg Intravenous Given 05/03/18 1632)    Mobility non-ambulatory

## 2018-05-03 NOTE — ED Notes (Signed)
Date and time results received: 05/03/18 3:21 PM  (use smartphrase ".now" to insert current time)  Test: Troponin Critical Value: 0.03  Name of Provider Notified: Tomi Bamberger  Orders Received? Or Actions Taken?: awaiting orders

## 2018-05-03 NOTE — ED Notes (Signed)
Pt desatted to 78% on 5L [place].  Pt put on non-rebreather, pt's O2 sats improved to 85-88%.  MD notified.  Respiratory called about pt being placed on BiPAP

## 2018-05-03 NOTE — ED Notes (Signed)
Bed: WA20 Expected date:  Expected time:  Means of arrival:  Comments: EMS 85yo PNA

## 2018-05-03 NOTE — ED Notes (Addendum)
New foley bag placed

## 2018-05-04 ENCOUNTER — Inpatient Hospital Stay (HOSPITAL_COMMUNITY): Payer: Medicare Other

## 2018-05-04 DIAGNOSIS — J9601 Acute respiratory failure with hypoxia: Secondary | ICD-10-CM

## 2018-05-04 DIAGNOSIS — G9340 Encephalopathy, unspecified: Secondary | ICD-10-CM

## 2018-05-04 DIAGNOSIS — J209 Acute bronchitis, unspecified: Secondary | ICD-10-CM

## 2018-05-04 DIAGNOSIS — J189 Pneumonia, unspecified organism: Principal | ICD-10-CM

## 2018-05-04 LAB — CBC
HEMATOCRIT: 45.9 % (ref 39.0–52.0)
HEMOGLOBIN: 14.3 g/dL (ref 13.0–17.0)
MCH: 32 pg (ref 26.0–34.0)
MCHC: 31.2 g/dL (ref 30.0–36.0)
MCV: 102.7 fL — AB (ref 80.0–100.0)
Platelets: 311 10*3/uL (ref 150–400)
RBC: 4.47 MIL/uL (ref 4.22–5.81)
RDW: 13.6 % (ref 11.5–15.5)
WBC: 18.5 10*3/uL — ABNORMAL HIGH (ref 4.0–10.5)
nRBC: 0 % (ref 0.0–0.2)

## 2018-05-04 LAB — RESPIRATORY PANEL BY PCR

## 2018-05-04 LAB — COMPREHENSIVE METABOLIC PANEL
ALT: 15 U/L (ref 0–44)
AST: 15 U/L (ref 15–41)
Albumin: 3.2 g/dL — ABNORMAL LOW (ref 3.5–5.0)
Alkaline Phosphatase: 101 U/L (ref 38–126)
Anion gap: 13 (ref 5–15)
BILIRUBIN TOTAL: 1 mg/dL (ref 0.3–1.2)
BUN: 11 mg/dL (ref 8–23)
CO2: 24 mmol/L (ref 22–32)
Calcium: 8.4 mg/dL — ABNORMAL LOW (ref 8.9–10.3)
Chloride: 96 mmol/L — ABNORMAL LOW (ref 98–111)
Creatinine, Ser: 0.51 mg/dL — ABNORMAL LOW (ref 0.61–1.24)
GFR calc Af Amer: >60 mL/min (ref >60)
GFR calc non Af Amer: >60 mL/min (ref >60)
Glucose, Bld: 124 mg/dL — ABNORMAL HIGH (ref 70–99)
Potassium: 5.2 mmol/L — ABNORMAL HIGH (ref 3.5–5.1)
Sodium: 133 mmol/L — ABNORMAL LOW (ref 135–145)
Total Protein: 5.7 g/dL — ABNORMAL LOW (ref 6.5–8.1)

## 2018-05-04 LAB — PROCALCITONIN

## 2018-05-04 LAB — BRAIN NATRIURETIC PEPTIDE: B Natriuretic Peptide: 64.1 pg/mL (ref 0.0–100.0)

## 2018-05-04 MED ORDER — ACETYLCYSTEINE 20 % IN SOLN: 70.0000 mg/kg | Freq: Once | RESPIRATORY_TRACT | Status: DC

## 2018-05-04 MED ORDER — CHLORHEXIDINE GLUCONATE 0.12 % MT SOLN
15.0000 mL | Freq: Two times a day (BID) | OROMUCOSAL | Status: DC
  Administered 2018-05-04 – 2018-05-10 (×11): 15 mL via OROMUCOSAL
  Filled 2018-05-04 (×10): qty 15

## 2018-05-04 MED ORDER — ORAL CARE MOUTH RINSE
15.0000 mL | Freq: Two times a day (BID) | OROMUCOSAL | Status: DC
  Administered 2018-05-04 – 2018-05-07 (×4): 15 mL via OROMUCOSAL

## 2018-05-04 MED ORDER — FUROSEMIDE 10 MG/ML IJ SOLN
20.0000 mg | Freq: Four times a day (QID) | INTRAMUSCULAR | Status: AC
  Administered 2018-05-04 – 2018-05-05 (×3): 20 mg via INTRAVENOUS
  Filled 2018-05-04 (×3): qty 2

## 2018-05-04 NOTE — Consult Note (Signed)
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  Patient was started on cefepime and vanc and cultures were drawn and subsequently he was successfully liberated from the BiPAP but on 12/25 he started having increase in RR and WOB and was placed back on BiPAP.  PCCM was consulted for assistance with BiPAP management.  Patient is arousble but on BiPAP and difficult to obtain history, no family bedside.  TRH-MD spoke with family on 12/24 who reported patient is full code and thus PCCM was consulted.   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  Patient was started on cefepime and vanc and cultures were drawn and subsequently he was successfully liberated from the BiPAP but on 12/25 he started having increase in RR and WOB and was placed back on BiPAP.  PCCM was consulted for assistance with BiPAP management.  Patient is arousble but on BiPAP and difficult to obtain history, no family bedside.  TRH-MD spoke with family on 12/24 who reported patient is full code and thus PCCM was consulted.   Past Medical History  Post polio quad CLL with no treatment Chronic foley  Prostate cancer awaiting surgery   Significant Hospital Events   12/24 admission with BiPAP for HCAP on the RLL 12/25 PCCM consult for BiPAP management  Consults:  PCCM  Procedures:  None  Significant Diagnostic Tests:  CXR 12/24 and 12/25  that I reviewed myself that showed RLL infiltrate but is very difficult to interpret due to scoliosis  Micro Data:  Urine 12/24>>> MRSA 12/24 positive  RVP 12/24 pending Blood 12/24>>>  Antimicrobials:  Cefepime 12/24>>> Vancomycin 12/24>>>  Interim history/subjective:  Increased WOB this AM  Objective   Blood pressure (!) 142/68, pulse 97, temperature 97.6 F (36.4 C), temperature source Oral, resp. rate 19, height 5\' 11"  (1.803 m), weight 82.6 kg, SpO2 92 %.    FiO2 (%):  [60 %-100 %] 100 %   Intake/Output Summary (Last 24 hours) at 05/04/2018 1102 Last data filed at 05/04/2018 0900 Gross per 24 hour  Intake 849.24 ml  Output 600 ml  Net 249.24 ml   Filed Weights   05/03/18 1254  Weight: 82.6 kg    Examination: General: Chronically ill appearing male in severe respiratory distress HENT: [place]/AT, PERRL, EOM-I and MMM Lungs: Coarse BS diffusely with bibasilar crackles Cardiovascular: Regular, tachy, Nl S1/S2 and -M/R/G Abdomen: Soft, NT, ND and +BS Extremities: -edema and -tenderness Neuro: Lethargic but interactive, quad GU: Chronic foley Skin: thin but intact  Resolved Hospital Problem list   N/A  Assessment & Plan:  84 year old male quad from post polio with HCAP, discussed with TRH-MD  Acute on chronic hypoxemic respiratory failure due to pneumonia and progressive weakness. - Place back on BiPAP - Treat HCAP - Pan culture - Low dose diuretics - Control anxiety with benzos  HCAP: - Cefepime - Vanc - Procal - F/u on culture  Hematuria: -  Follow urine culture - May need urology to see sooner if decompensates - Maintain foley  Hyponatremia: - Lasix gently to address that as well as hyperkalemia. - BMET in AM - Replace electrolytes as indicated  Pulmonary edema: - Check BNP - 2D echo - Lasix 20 mg IV q6 x3 doses  GOC: - If intubated is highly unlikely to liberate from vent - TRH-MD will communicate with family - If needs to be intubated  then will call family prior to intubation as I do not believe patient would liberate  PCCM will follow  Labs   CBC: Recent Labs  Lab 05/02/18 1608 05/03/18 1347 05/04/18 0314  WBC 19.1* 20.7* 18.5*  HGB 15.2 14.6 14.3  HCT 46.6 45.2 45.9  MCV 99.8 98.5 102.7*  PLT 327 318 262    Basic Metabolic Panel: Recent Labs  Lab 05/02/18 1608 05/03/18 1347 05/04/18 0314  NA 133* 130* 133*  K 5.0 5.1 5.2*  CL 94* 91* 96*  CO2 31 30 24   GLUCOSE 113* 94 124*  BUN 8 9 11   CREATININE 0.50* 0.40* 0.51*  CALCIUM 9.1 8.7* 8.4*   GFR: Estimated Creatinine Clearance: 74.5 mL/min (A) (by C-G formula based on SCr of 0.51 mg/dL (L)). Recent Labs  Lab 05/02/18 1608 05/03/18 1347 05/04/18 0314  WBC 19.1* 20.7* 18.5*    Liver Function Tests: Recent Labs  Lab 05/04/18 0314  AST 15  ALT 15  ALKPHOS 101  BILITOT 1.0  PROT 5.7*  ALBUMIN 3.2*   No results for input(s): LIPASE, AMYLASE in the last 168 hours. No results for input(s): AMMONIA in the last 168 hours.  ABG    Component Value Date/Time   PHART 7.344 (L) 05/03/2018 1537   PCO2ART 57.2 (H) 05/03/2018 1537   PO2ART 70.1 (L) 05/03/2018 1537   HCO3 30.3 (H) 05/03/2018 1537   O2SAT 92.7 05/03/2018 1537     Coagulation Profile: No results for input(s): INR, PROTIME in the last 168 hours.  Cardiac Enzymes: Recent Labs  Lab 05/03/18 1347  TROPONINI 0.03*    HbA1C: No results found for: HGBA1C  CBG: No results for input(s): GLUCAP in the last 168 hours.  Review of Systems:   Unattainable4  Past Medical History  He,  has a past medical history of Anxiety, CLL (chronic lymphocytic leukemia) ([place]) (02/20/2014), Osteoporosis, Pneumonia, and Polio.   Surgical History    Past Surgical History:  Procedure Laterality Date  . CATARACT EXTRACTION  09/2011   at [facility]  . COLONOSCOPY  03/2007  . RETINAL DETACHMENT SURGERY  09/1995  . SPINAL FUSION  10/1952  . TONSILLECTOMY       Social History   reports  that he has been smoking. He has smoked for the past 25.00 years. He has never used smokeless tobacco. He reports current alcohol use. He reports that he does not use drugs.   Family History   His family history includes Colon cancer (age of onset: 10) in his sister.   Allergies Allergies  Allergen Reactions  . Penicillins Rash    Has patient had a PCN reaction causing immediate rash, facial/tongue/throat swelling, SOB or lightheadedness with hypotension: Y Has patient had a PCN reaction causing severe rash involving mucus membranes or skin necrosis: N Has patient had a PCN reaction that required hospitalization: N Has patient had a PCN reaction occurring within the last 10 years: N If all of the above answers are "NO", then may proceed with Cephalosporin use.  Home Medications  Prior to Admission medications   Medication Sig Start Date End Date Taking? Authorizing Provider  buPROPion (WELLBUTRIN SR) 150 MG 12 hr tablet Take 1 tablet by mouth 2 (two) times daily.  02/24/12  Yes [provider]  Calcium Carbonate-Vitamin D (CALTRATE 600+D PO) Take 1 tablet by mouth daily.   Yes [provider]  ergocalciferol (VITAMIN D2) 50000 units capsule Take 50,000 Units by mouth every 14 (fourteen) days. On Monday   Yes [provider]  LORazepam (ATIVAN) 0.5 MG tablet Take 0.5 mg by mouth at bedtime as needed for sleep.   Yes [provider]  Multiple Vitamins-Minerals (OCUVITE PO) Take 1 tablet by mouth daily.   Yes [provider]  Multiple Vitamins-Minerals (THEREMS-H) TABS Take 1 tablet by mouth daily.   Yes [provider]  polyethylene glycol (MIRALAX / GLYCOLAX) packet Take 17 g by mouth daily as needed. Patient taking differently: Take 17 g by mouth daily as needed for mild constipation.  03/31/18  Yes Ngetich, Dinah C, NP  sertraline (ZOLOFT) 50 MG tablet Take 50 mg by mouth daily.   Yes [provider]  tamsulosin  (FLOMAX) 0.4 MG CAPS capsule Take 1 capsule (0.4 mg total) by mouth daily. 02/28/18  Yes Shelly Coss, MD  zinc oxide 20 % ointment Apply 1 application topically as needed for irritation. To buttocks after every incontinent episode and as needed for redness. May keep at bedside.   Yes [provider]    The patient is critically ill with multiple organ systems failure and requires high complexity decision making for assessment and support, frequent evaluation and titration of therapies, application of advanced monitoring technologies and extensive interpretation of multiple databases.   Critical Care Time devoted to patient care services described in this note is  45  Minutes. This time reflects time of care of this signee Dr Jennet Maduro. This critical care time does not reflect procedure time, or teaching time or supervisory time of PA/NP/Med student/Med Resident etc but could involve care discussion time.  Rush Farmer, M.D. [facility] Pulmonary/Critical Care Medicine. Pager: [phone]. After hours pager: [phone].

## 2018-05-04 NOTE — Progress Notes (Signed)
PROGRESS NOTE    Manuel Reeves  RDE:[id number] DOB: 1933/03/12 DOA: 05/03/2018 PCP: Crist Infante, MD   Brief Narrative:84 y.o. male with medical history significant of CLL no active treatment, osteoporosis, anxiety, depression, BPH with urinary retention awaiting prostate surgery 1230 Dr. Karsten Ro post polio syndrome associated with quadriparesis admitted with cough and increasing shortness of breath for the last few days which has been progressively getting worse.  His appetite had been diminished and family reports that he is more fatigued than before.  He moves around in a motorized scooter at home.  Family reports he does not mount a fever most of the time because of his immunosuppressant state.  Denies any nausea vomiting had diarrhea 2 days ago but has not had any since then.  He was recently admitted to the hospital and discharged on December 12.  At that time he was also diagnosed with pneumonia and discharged home on antibiotics.  He notices that he is progressively getting worse he is not able to shrug his shoulders anymore with the post polio is overall strength and breathing is getting worse.  He does not have oxygen at home.  His urologist is Dr. Karsten Ro.  He was due to go for his surgery on Monday.  Currently he has gross hematuria.  He has a chronic Foley for the last 6 years.  Denies any chest pain. Wife reports that he takes diazepam at home for sleep and anxiety.  Few days ago he had a panic attack where he thought he was going to die.  ED Course: He was placed on BiPAP since he became very hypoxic to the 70s.  He got nebulizer treatments and doxycycline and prednisone in the ER.  His VBG was pH 7.34 PCO2 57 PO2 70 on 50% FiO2.  Sodium 130 potassium 5.1 BUN 9 creatinine 0.40 troponin was 0.03 BNP was 83 white count 20.7 hemoglobin 14.6 platelet count 318.  Influenza a and B was negative.  Chest x-ray showed no acute changes.  Chest x-ray was unremarkable.  Assessment &  Plan:   Active Problems:   HCAP (healthcare-associated pneumonia)   Hyponatremia   #1 Healthcare associated pneumonia patient admitted to the hospital recently with the same.  He was now readmitted with shortness of breath and cough and increasing generalized weakness.  We will treat him with Vanco and cefepime for now narrow antibiotics as possible.  MRSA PCR positive.  Chest x-ray done 05/04/2018 no new changes.  #2 gross hematuria patient unable to tell me if he has any urinary complaints other than hematuria.  He has a chronic Foley catheter at home over 6 years.  And he has post polio syndrome.  Follow urine culture.  #3 hyponatremia secondary to hypovolemia and diminished intake.  Improved with normal saline.   #4 CLL chronic leukocytosis followed by Dr. Simeon Craft such.  Improved  #5 BPH continue Flomax continue Foley care  #6 anxiety and depression patient takes Wellbutrin so the Zoloft and Ativan which will be continued as soon as he is able to take p.o.   #7 post polio syndrome with quadriparesis patient has motorized scooter at home  Estimated body mass index is 25.4 kg/m as calculated from the following:   Height as of this encounter: 5\' 11"  (1.803 m).   Weight as of this encounter: 82.6 kg.  DVT prophylaxis: Lovenox Code Status: Full code Family Communication: No family in the room at this time Disposition Plan: Pending clinical improvement Consultants: None  Procedures: None  Antimicrobials: Vanco and cefepime  Subjective: He is resting in bed off of BiPAP at this time.  However dependent on oxygen.  He does not have oxygen at home.  Struggling to bring up the phlegm.   Objective: Vitals:   05/04/18 0700 05/04/18 0800 05/04/18 0846 05/04/18 0900  BP:  107/69  (!) 142/68  Pulse: 98 100 96 97  Resp: (!) 22 (!) 23 (!) 21 19  Temp:  97.6 F (36.4 C)    TempSrc:  Oral    SpO2: 100% 97% 95% 92%  Weight:      Height:        Intake/Output Summary (Last 24  hours) at 05/04/2018 1036 Last data filed at 05/04/2018 0900 Gross per 24 hour  Intake 849.24 ml  Output 600 ml  Net 249.24 ml   Filed Weights   05/03/18 1254  Weight: 82.6 kg    Examination:  General exam: Appears calm and comfortable  Respiratory system: Clear to auscultation. Respiratory effort normal. Cardiovascular system: S1 & S2 heard, RRR. No JVD, murmurs, rubs, gallops or clicks. No pedal edema. Gastrointestinal system: Abdomen is nondistended, soft and nontender. No organomegaly or masses felt. Normal bowel sounds heard. Central nervous system: Alert and oriented. No focal neurological deficits. Extremities: Symmetric 5 x 5 power. Skin: No rashes, lesions or ulcers Psychiatry: Judgement and insight appear normal. Mood & affect appropriate.     Data Reviewed: I have personally reviewed following labs and imaging studies  CBC: Recent Labs  Lab 05/02/18 1608 05/03/18 1347 05/04/18 0314  WBC 19.1* 20.7* 18.5*  HGB 15.2 14.6 14.3  HCT 46.6 45.2 45.9  MCV 99.8 98.5 102.7*  PLT 327 318 630   Basic Metabolic Panel: Recent Labs  Lab 05/02/18 1608 05/03/18 1347 05/04/18 0314  NA 133* 130* 133*  K 5.0 5.1 5.2*  CL 94* 91* 96*  CO2 31 30 24   GLUCOSE 113* 94 124*  BUN 8 9 11   CREATININE 0.50* 0.40* 0.51*  CALCIUM 9.1 8.7* 8.4*   GFR: Estimated Creatinine Clearance: 74.5 mL/min (A) (by C-G formula based on SCr of 0.51 mg/dL (L)). Liver Function Tests: Recent Labs  Lab 05/04/18 0314  AST 15  ALT 15  ALKPHOS 101  BILITOT 1.0  PROT 5.7*  ALBUMIN 3.2*   No results for input(s): LIPASE, AMYLASE in the last 168 hours. No results for input(s): AMMONIA in the last 168 hours. Coagulation Profile: No results for input(s): INR, PROTIME in the last 168 hours. Cardiac Enzymes: Recent Labs  Lab 05/03/18 1347  TROPONINI 0.03*   BNP (last 3 results) No results for input(s): PROBNP in the last 8760 hours. HbA1C: No results for input(s): HGBA1C in the last 72  hours. CBG: No results for input(s): GLUCAP in the last 168 hours. Lipid Profile: No results for input(s): CHOL, HDL, LDLCALC, TRIG, CHOLHDL, LDLDIRECT in the last 72 hours. Thyroid Function Tests: No results for input(s): TSH, T4TOTAL, FREET4, T3FREE, THYROIDAB in the last 72 hours. Anemia Panel: No results for input(s): VITAMINB12, FOLATE, FERRITIN, TIBC, IRON, RETICCTPCT in the last 72 hours. Sepsis Labs: No results for input(s): PROCALCITON, LATICACIDVEN in the last 168 hours.  Recent Results (from the past 240 hour(s))  MRSA PCR Screening     Status: Abnormal   Collection Time: 05/03/18  5:33 PM  Result Value Ref Range Status   MRSA by PCR POSITIVE (A) NEGATIVE Final    Comment:        The GeneXpert MRSA Assay (FDA approved for  NASAL specimens only), is one component of a comprehensive MRSA colonization surveillance program. It is not intended to diagnose MRSA infection nor to guide or monitor treatment for MRSA infections. RESULT CALLED TO, READ BACK BY AND VERIFIED WITH: S.LANEY AT 2026 ON 05/03/18 BY N.THOMPSON Performed at [facility], [place] [address], [place], [place] [address]          Radiology Studies: Dg Chest 1 View  Result Date: 05/04/2018 CLINICAL DATA:  Cough. EXAM: CHEST  1 VIEW COMPARISON:  05/03/2018. FINDINGS: 0512 hours. Marked thoracolumbar scoliosis distorts cardio thoracic anatomy. Interstitial markings are diffusely coarsened with chronic features. Similar appearance of hazy opacity over the lung bases, indeterminate. Bones are diffusely demineralized. IMPRESSION: No appreciable interval change in exam. Electronically Signed   By: Misty Stanley M.D.   On: 05/04/2018 07:52   Dg Chest 2 View  Result Date: 05/03/2018 CLINICAL DATA:  Preop.  Recent pneumonia.  Cough. EXAM: CHEST - 2 VIEW COMPARISON:  04/21/2018 FINDINGS: Severe thoracolumbar scoliosis and deformity of the ribcage, limiting study. Very difficult to evaluate lung  bases. There appear to be increased densities at both lung bases, similar prior study, but this may be related to thoracic deformity rather than true infiltrate or effusion. IMPRESSION: Limited study due to severe thoracolumbar scoliosis and thoracic cage deformity. Persistent apparent increased densities at both lung bases may be related to thoracic deformity rather than true abnormality. Electronically Signed   By: Rolm Baptise M.D.   On: 05/03/2018 08:19   Dg Chest Port 1 View  Result Date: 05/03/2018 CLINICAL DATA:  Shortness of breath. EXAM: PORTABLE CHEST 1 VIEW COMPARISON:  05/02/2018. FINDINGS: The cardio pericardial silhouette is enlarged. Marked thoracolumbar scoliosis distorts cardio thoracic anatomy. No evidence for pulmonary edema. Haziness overlying the lung bases is stable and may be related to chest morphology. Atelectasis or infiltrate at the base or small effusion can not be excluded. Bones are diffusely demineralized. Telemetry leads overlie the chest. IMPRESSION: Stable exam. Marked thoracolumbar scoliosis distorts cardio thoracic anatomy limiting assessment of the lung bases. Electronically Signed   By: Misty Stanley M.D.   On: 05/03/2018 14:13        Scheduled Meds: . buPROPion  150 mg Oral BID  . chlorhexidine  15 mL Mouth Rinse BID  . Chlorhexidine Gluconate Cloth  6 each Topical Q0600  . dextromethorphan-guaiFENesin  1 tablet Oral BID  . enoxaparin (LOVENOX) injection  40 mg Subcutaneous Q24H  . ipratropium-albuterol  3 mL Nebulization TID  . mouth rinse  15 mL Mouth Rinse q12n4p  . mupirocin ointment  1 application Nasal BID  . sertraline  50 mg Oral Daily  . tamsulosin  0.4 mg Oral Daily   Continuous Infusions: . sodium chloride Stopped (05/03/18 1840)  . ceFEPime (MAXIPIME) IV Stopped (05/04/18 0547)  . vancomycin       LOS: 1 day     Georgette Shell, MD [facility]  If 7PM-7AM, please contact night-coverage [email or web address] Password  [facility] 05/04/2018, 10:36 AM

## 2018-05-04 NOTE — Progress Notes (Signed)
Patient currently off BiPAP. RT will continue to monitor

## 2018-05-05 ENCOUNTER — Inpatient Hospital Stay (HOSPITAL_COMMUNITY): Payer: Medicare Other

## 2018-05-05 DIAGNOSIS — Z7189 Other specified counseling: Secondary | ICD-10-CM

## 2018-05-05 DIAGNOSIS — J9601 Acute respiratory failure with hypoxia: Secondary | ICD-10-CM

## 2018-05-05 LAB — URINE CULTURE

## 2018-05-05 LAB — BLOOD GAS, ARTERIAL
Acid-Base Excess: 1.7 mmol/L (ref 0.0–2.0)
Bicarbonate: 29.2 mmol/L — ABNORMAL HIGH (ref 20.0–28.0)
Drawn by: 235321
O2 Content: 8 L/min
O2 Saturation: 96.1 %
Patient temperature: 98.6
pCO2 arterial: 61.9 mmHg — ABNORMAL HIGH (ref 32.0–48.0)
pH, Arterial: 7.295 — ABNORMAL LOW (ref 7.350–7.450)
pO2, Arterial: 84.9 mmHg (ref 83.0–108.0)

## 2018-05-05 LAB — BASIC METABOLIC PANEL
Anion gap: 10 (ref 5–15)
BUN: 16 mg/dL (ref 8–23)
CO2: 28 mmol/L (ref 22–32)
Calcium: 8.2 mg/dL — ABNORMAL LOW (ref 8.9–10.3)
Chloride: 98 mmol/L (ref 98–111)
Creatinine, Ser: 0.58 mg/dL — ABNORMAL LOW (ref 0.61–1.24)
GFR calc Af Amer: >60 mL/min (ref >60)
GFR calc non Af Amer: >60 mL/min (ref >60)
Glucose, Bld: 111 mg/dL — ABNORMAL HIGH (ref 70–99)
Potassium: 3.8 mmol/L (ref 3.5–5.1)
Sodium: 136 mmol/L (ref 135–145)

## 2018-05-05 LAB — CBC
HEMATOCRIT: 42.4 % (ref 39.0–52.0)
Hemoglobin: 13.2 g/dL (ref 13.0–17.0)
MCH: 31.9 pg (ref 26.0–34.0)
MCHC: 31.1 g/dL (ref 30.0–36.0)
MCV: 102.4 fL — ABNORMAL HIGH (ref 80.0–100.0)
Platelets: 250 10*3/uL (ref 150–400)
RBC: 4.14 MIL/uL — ABNORMAL LOW (ref 4.22–5.81)
RDW: 13.8 % (ref 11.5–15.5)
WBC: 19.4 10*3/uL — ABNORMAL HIGH (ref 4.0–10.5)
nRBC: 0 % (ref 0.0–0.2)

## 2018-05-05 LAB — PHOSPHORUS: Phosphorus: 2.8 mg/dL (ref 2.5–4.6)

## 2018-05-05 LAB — MAGNESIUM: Magnesium: 1.8 mg/dL (ref 1.7–2.4)

## 2018-05-05 MED ORDER — POLYETHYLENE GLYCOL 3350 17 G PO PACK
17.0000 g | PACK | Freq: Every day | ORAL | Status: DC
  Administered 2018-05-05 – 2018-05-09 (×4): 17 g via ORAL
  Filled 2018-05-05 (×4): qty 1

## 2018-05-05 MED ORDER — BISACODYL 10 MG RE SUPP: 10.0000 mg | Freq: Every day | RECTAL | Status: DC | PRN

## 2018-05-05 NOTE — Progress Notes (Addendum)
NAME:  Manuel Reeves, MRN:  [id number], DOB:  04-21-1933, LOS: 2 ADMISSION DATE:  05/03/2018, CONSULTATION DATE:  05/04/2018 REFERRING MD:  TRH-Mathews, CHIEF COMPLAINT:  Acute on chronic hypoxemic respiratory failure   Brief History   84 year old male with PMH of quad due to post polio syndrome and CLL who was recently hospitalized for pneumonia on 12/12 who returns with worsening weakness, and hypoxemic respiratory failure requiring BiPAP.  Patient was started on cefepime and vanc and cultures were drawn and subsequently he was successfully liberated from the BiPAP but on 12/25 he started having increase in RR and WOB and was placed back on BiPAP.  PCCM was consulted for assistance with BiPAP management.  Patient is arousble but on BiPAP and difficult to obtain history, no family bedside.  TRH-MD spoke with family on 12/24 who reported patient is full code and thus PCCM was consulted.   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  Patient was started on cefepime and vanc and cultures were drawn and subsequently he was successfully liberated from the BiPAP but on 12/25 he started having increase in RR and WOB and was placed back on BiPAP.  PCCM was consulted for assistance with BiPAP management.  Patient is arousble but on BiPAP and difficult to obtain history, no family bedside.  TRH-MD spoke with family on 12/24 who reported patient is full code and thus PCCM was consulted.   Past Medical History  Post polio quad CLL with no treatment Chronic foley  Prostate cancer awaiting surgery   Significant Hospital Events   12/24 admission with BiPAP for HCAP on the RLL 12/25 PCCM consult for BiPAP management 12/26: Improved, DNR status discussed, MOST form filled out Consults:  PCCM  Procedures:   None  Significant Diagnostic Tests:  CXR 12/24 and 12/25 that I reviewed myself that showed RLL infiltrate but is very difficult to interpret due to scoliosis  Micro Data:  Urine 12/24>>> MRSA 12/24 positive  RVP 12/24 pending Blood 12/24>>>  Antimicrobials:  Cefepime 12/24>>> Vancomycin 12/24>>>  Interim history/subjective:  Increased WOB this AM  Objective   Blood pressure (Abnormal) 118/56, pulse 96, temperature (Abnormal) 97.2 F (36.2 C), temperature source Axillary, resp. rate 17, height 5\' 11"  (1.803 m), weight 82.6 kg, SpO2 100 %.        Intake/Output Summary (Last 24 hours) at 05/05/2018 1023 Last data filed at 05/05/2018 0600 Gross per 24 hour  Intake 1250.09 ml  Output 1900 ml  Net -649.91 ml   Filed Weights   05/03/18 1254  Weight: 82.6 kg    Examination: General: This is a pleasant 84 year old white male is currently lying in bed he is in no acute distress HEENT normocephalic atraumatic no jugular venous distention Pulmonary: Diminished bases no accessory use Cardiac: Regular rate and rhythm Abdomen: Soft nontender Extremities: Generalized weakness, weak upper extremity no movement lower extremities. Neuro: Awake oriented no focal deficits.  Resolved Hospital Problem list   N/A  Assessment & Plan:  84 year old male quad from post polio with HCAP, discussed with TRH-MD  Acute on chronic hypoxemic respiratory failure due to pneumonia and progressive weakness +/- element of volume overload.   History of tobacco abuse/COPD Portable chest x-ray personally reviewed this demonstrates left greater than right airspace disease no significant change  compared with prior film -Given his neuromuscular weakness from underlying postpolio syndrome I think he would benefit from noninvasive ventilation at at bedtime Plan Day #3 vancomycin and cefepime Wean oxygen Diuresis as tolerated Nocturnal BiPAP Bronchodilators We will see if we can get him BiPAP at  discharge DO NOT RESUSCITATE We will meet with family this afternoon and fill out MOST form  Hematuria: -Urine culture with multiple organisms Plan Reculture urine Consider changing Foley catheter Consider urology evaluation given history  Hyponatremia: Improved with diuresis Plan Continuing IV Lasix A.m. chemistry  Disposition: Meeting with family later today will formally fill out DNR orders.  Placing order with case management for trilogy ventilator.  I think he benefit from nocturnal ventilation. This patient requires non-invasive ventilator for acute on chronic respiratory failure and COPD. Without ventilator, there is significant risk of untimely readmission to the hospital and increase on CO2 retention. BIPAP is ineffective in the home setting in reducing CO2 due to the lack of an auto-rate.    [name] ACNP-BC [place] Pager # [phone] OR # [phone] if no answer  Attending Note:  84 year old male with quad due to post polio syndrome who presents to [facility] for respiratory failure that is likely progressive.  On exam, remains on BiPAP with decreased BS diffusely.  I reviewed CXR myself, infiltrate on the RLL noted.  Discussed with PCCM-NP.  Spoke with family at length regarding treatment options.  Family meeting again today at 1 PM and will likely go with DNR and BiPAP at night or trilogy at home.  In the meantime, continue abx and mechanical support.  No intubation.  The patient is critically ill with multiple organ systems failure and requires high complexity decision making for assessment and support, frequent evaluation and titration of therapies, application of advanced monitoring technologies and extensive interpretation of multiple databases.   Critical Care Time devoted to patient care services described in this note is  35  Minutes. This time reflects time of care of this signee Dr Jennet Maduro. This critical care time does not reflect  procedure time, or teaching time or supervisory time of PA/NP/Med student/Med Resident etc but could involve care discussion time.  Rush Farmer, M.D. [facility] Pulmonary/Critical Care Medicine. Pager: [phone]. After hours pager: [phone].

## 2018-05-05 NOTE — Care Management Note (Signed)
Case Management Note  Patient Details  Name: LUVERNE ZERKLE MRN: [id number] Date of Birth: 04/13/1933  Subjective/Objective:                  Respiratory failure  Action/Plan: hfnc at 100% Iv maxipime and iv vancomycin Wbc-19.4 CXR_Marked scoliosis and chest deformity. Bibasilar atelectasis/infiltrate unchanged with small right effusion. Expected Discharge Date:                  Expected Discharge Plan:     In-House Referral:     Discharge planning Services     Post Acute Care Choice:    Choice offered to:     DME Arranged:    DME Agency:     HH Arranged:    HH Agency:     Status of Service:     If discussed at [employer], dates discussed:    Additional Comments:  [name], RN 05/05/2018, 9:50 AM

## 2018-05-05 NOTE — Progress Notes (Signed)
Called and LM on VM for [employer], scheduler that patient is in ICU, Room 1239.

## 2018-05-05 NOTE — Progress Notes (Signed)
PROGRESS NOTE    Manuel Reeves  CBJ:[id number] DOB: 04-Jun-1932 DOA: 05/03/2018 PCP: Crist Infante, MD  Brief Narrative: 84 y.o.malewith medical history significant ofCLL no active treatment, osteoporosis, anxiety, depression, BPH with urinary retention awaiting prostate surgery 1230 Dr. Karsten Ro post polio syndrome associated with quadriparesis admitted with cough and increasing shortness of breath for the last few days which has been progressively getting worse. His appetite had been diminished and family reports that he is more fatigued than before. He moves around in a motorized scooter at home. Family reports he does not mount a fever most of the time because of his immunosuppressant state. Denies any nausea vomiting had diarrhea 2 days ago but has not had any since then. He was recently admitted to the hospital and discharged on December 12. At that time he was also diagnosed with pneumonia and discharged home on antibiotics. He notices that he is progressively getting worse he is not able to shrug his shoulders anymore with the post polio is overall strength and breathing is getting worse. He does not have oxygen at home.  His urologist is Dr. Karsten Ro. He was due to go for his surgery on Monday. Currently he has gross hematuria. He has a chronic Foley for the last 6 years.  Denies any chest pain. Wife reports that he takes diazepam at home for sleep and anxiety. Few days ago he had a panic attack where he thought he was going to die.  ED Course:He was placed on BiPAP since he became very hypoxic to the 70s. He got nebulizer treatments and doxycycline and prednisone in the ER. His VBG was pH 7.34 PCO2 57 PO2 70 on 50% FiO2. Sodium 130 potassium 5.1 BUN 9 creatinine 0.40 troponin was 0.03 BNP was 83 white count 20.7 hemoglobin 14.6 platelet count 318. Influenza a and B was negative. Chest x-ray showed no acute changes. Chest x-ray was unremarkable. 05/05/2018 patient seen  and examined in the room.  He is currently resting in bed in no acute distress on oxygen by nasal cannula.  He reports he had a better night.  Was not put back on BiPAP overnight.  He feels his breathing is better currently.  No family in the room at this time.  Assessment & Plan:   Active Problems:   HCAP (healthcare-associated pneumonia)   Hyponatremia   Bronchitis with bronchospasm  #1  Acute on chronic hypoxic respiratory failure secondary to Healthcare associated pneumonia patient admitted to the hospital recently with the same. He was now readmitted with shortness of breath and cough and increasing generalized weakness. We will treat him with Vanco and cefepime for now narrow antibiotics as possible.  MRSA PCR positive.  Chest x-ray done 05/05/2018 bibasilar atelectasis versus infiltrate with small right effusion.  Appreciate PCCM assistance.  #2 gross hematuria resolved  He has a chronic Foley catheter at home over 6 years. And he has post polio syndrome. urine culture shows multiple species.  Patient with history of prostate cancer was pending surgery early next month.  #3 hyponatremiaresolved  #4 CLL chronic leukocytosis followed by Dr. Simeon Craft such.  Improved  #5 BPH continue Flomax continue Foley care  #6 anxiety and depression patient takes Wellbutrin so the Zoloft and Ativan which will be continued as soon as he is able to take p.o.   #7 post polio syndrome with quadriparesis patient has motorized scooter.  Estimated body mass index is 25.4 kg/m as calculated from the following:   Height as of this encounter:  5\' 11"  (1.803 m).   Weight as of this encounter: 82.6 kg.  DVT prophylaxis: Lovenox Code Status: PCCM discussed with patient and family and currently patient is DO NOT RESUSCITATE Family Communication: Discussed with patient's son and wife yesterday. Disposition Plan: Pending clinical improvement   Consultants: PCCM   Procedures: None Antimicrobials:  Vanco cefepime  Subjective: Resting in bed reports feeling better than yesterday.   Objective: Vitals:   05/05/18 0729 05/05/18 0800 05/05/18 0900 05/05/18 1000  BP:  118/69 (!) 125/56 (!) 118/56  Pulse: 93 86 92 96  Resp: 15 15 17 17   Temp:  (!) 97.2 F (36.2 C)    TempSrc:  Axillary    SpO2: 100% 100% 100% 100%  Weight:      Height:        Intake/Output Summary (Last 24 hours) at 05/05/2018 1142 Last data filed at 05/05/2018 0600 Gross per 24 hour  Intake 1250.09 ml  Output 1900 ml  Net -649.91 ml   Filed Weights   05/03/18 1254  Weight: 82.6 kg    Examination:  General exam: Appears calm and comfortable  Respiratory system:  diminished breath sounds at the bases to auscultation. Respiratory effort normal.  Chest wall deformity Cardiovascular system: S1 & S2 heard, RRR. No JVD, murmurs, rubs, gallops or clicks. No pedal edema. Gastrointestinal system: Abdomen is nondistended, soft and nontender. No organomegaly or masses felt. Normal bowel sounds heard. Central nervous system: Alert and oriented. No focal neurological deficits. Extremities: Symmetric 5 x 5 power. Skin: No rashes, lesions or ulcers Psychiatry: Judgement and insight appear normal. Mood & affect appropriate.     Data Reviewed: I have personally reviewed following labs and imaging studies  CBC: Recent Labs  Lab 05/02/18 1608 05/03/18 1347 05/04/18 0314 05/05/18 0320  WBC 19.1* 20.7* 18.5* 19.4*  HGB 15.2 14.6 14.3 13.2  HCT 46.6 45.2 45.9 42.4  MCV 99.8 98.5 102.7* 102.4*  PLT 327 318 311 161   Basic Metabolic Panel: Recent Labs  Lab 05/02/18 1608 05/03/18 1347 05/04/18 0314 05/05/18 0320  NA 133* 130* 133* 136  K 5.0 5.1 5.2* 3.8  CL 94* 91* 96* 98  CO2 31 30 24 28   GLUCOSE 113* 94 124* 111*  BUN 8 9 11 16   CREATININE 0.50* 0.40* 0.51* 0.58*  CALCIUM 9.1 8.7* 8.4* 8.2*  MG  --   --   --  1.8  PHOS  --   --   --  2.8   GFR: Estimated Creatinine Clearance: 74.5 mL/min (A) (by  C-G formula based on SCr of 0.58 mg/dL (L)). Liver Function Tests: Recent Labs  Lab 05/04/18 0314  AST 15  ALT 15  ALKPHOS 101  BILITOT 1.0  PROT 5.7*  ALBUMIN 3.2*   No results for input(s): LIPASE, AMYLASE in the last 168 hours. No results for input(s): AMMONIA in the last 168 hours. Coagulation Profile: No results for input(s): INR, PROTIME in the last 168 hours. Cardiac Enzymes: Recent Labs  Lab 05/03/18 1347  TROPONINI 0.03*   BNP (last 3 results) No results for input(s): PROBNP in the last 8760 hours. HbA1C: No results for input(s): HGBA1C in the last 72 hours. CBG: No results for input(s): GLUCAP in the last 168 hours. Lipid Profile: No results for input(s): CHOL, HDL, LDLCALC, TRIG, CHOLHDL, LDLDIRECT in the last 72 hours. Thyroid Function Tests: No results for input(s): TSH, T4TOTAL, FREET4, T3FREE, THYROIDAB in the last 72 hours. Anemia Panel: No results for input(s): VITAMINB12, FOLATE, FERRITIN,  TIBC, IRON, RETICCTPCT in the last 72 hours. Sepsis Labs: Recent Labs  Lab 05/04/18 1207  PROCALCITON <0.10    Recent Results (from the past 240 hour(s))  Culture, Urine     Status: None   Collection Time: 05/03/18  5:33 PM  Result Value Ref Range Status   Specimen Description   Final    URINE, RANDOM Performed at [facility] [address] [place], [place] [address]    Special Requests   Final    NONE Performed at [facility], [place] [address] [place], [place] [address]    Culture   Final    Multiple bacterial morphotypes present, none predominant. Suggest appropriate recollection if clinically indicated.   Report Status 05/05/2018 FINAL  Final  MRSA PCR Screening     Status: Abnormal   Collection Time: 05/03/18  5:33 PM  Result Value Ref Range Status   MRSA by PCR POSITIVE (A) NEGATIVE Final    Comment:        The GeneXpert MRSA Assay (FDA approved for NASAL specimens only), is one component of  a comprehensive MRSA colonization surveillance program. It is not intended to diagnose MRSA infection nor to guide or monitor treatment for MRSA infections. RESULT CALLED TO, READ BACK BY AND VERIFIED WITH: S.LANEY AT 2026 ON 05/03/18 BY N.THOMPSON Performed at [facility], [place] [address], [place], [place] [address]   Respiratory Panel by PCR     Status: None   Collection Time: 05/03/18  7:09 PM  Result Value Ref Range Status   Adenovirus NOT DETECTED NOT DETECTED Final   Coronavirus 229E NOT DETECTED NOT DETECTED Final   Coronavirus HKU1 NOT DETECTED NOT DETECTED Final   Coronavirus NL63 NOT DETECTED NOT DETECTED Final   Coronavirus OC43 NOT DETECTED NOT DETECTED Final   Metapneumovirus NOT DETECTED NOT DETECTED Final   Rhinovirus / Enterovirus NOT DETECTED NOT DETECTED Final   Influenza A NOT DETECTED NOT DETECTED Final   Influenza B NOT DETECTED NOT DETECTED Final   Parainfluenza Virus 1 NOT DETECTED NOT DETECTED Final   Parainfluenza Virus 2 NOT DETECTED NOT DETECTED Final   Parainfluenza Virus 3 NOT DETECTED NOT DETECTED Final   Parainfluenza Virus 4 NOT DETECTED NOT DETECTED Final   Respiratory Syncytial Virus NOT DETECTED NOT DETECTED Final   Bordetella pertussis NOT DETECTED NOT DETECTED Final   Chlamydophila pneumoniae NOT DETECTED NOT DETECTED Final   Mycoplasma pneumoniae NOT DETECTED NOT DETECTED Final    Comment: Performed at [facility], [place] [address], [place], [place] [address]         Radiology Studies: Dg Chest 1 View  Result Date: 05/04/2018 CLINICAL DATA:  Cough. EXAM: CHEST  1 VIEW COMPARISON:  05/03/2018. FINDINGS: 0512 hours. Marked thoracolumbar scoliosis distorts cardio thoracic anatomy. Interstitial markings are diffusely coarsened with chronic features. Similar appearance of hazy opacity over the lung bases, indeterminate. Bones are diffusely demineralized. IMPRESSION: No appreciable interval change in exam.  Electronically Signed   By: Misty Stanley M.D.   On: 05/04/2018 07:52   Dg Chest Port 1 View  Result Date: 05/05/2018 CLINICAL DATA:  Endotracheal tube placement EXAM: PORTABLE CHEST 1 VIEW COMPARISON:  05/04/2018 FINDINGS: Endotracheal tube not present. Marked scoliosis and chest deformity. Bibasilar atelectasis/infiltrate unchanged with small right effusion. IMPRESSION: No significant change bibasilar atelectasis/infiltrate. Electronically Signed   By: Franchot Gallo M.D.   On: 05/05/2018 07:50   Dg Chest Port 1 View  Result Date: 05/03/2018  CLINICAL DATA:  Shortness of breath. EXAM: PORTABLE CHEST 1 VIEW COMPARISON:  05/02/2018. FINDINGS: The cardio pericardial silhouette is enlarged. Marked thoracolumbar scoliosis distorts cardio thoracic anatomy. No evidence for pulmonary edema. Haziness overlying the lung bases is stable and may be related to chest morphology. Atelectasis or infiltrate at the base or small effusion can not be excluded. Bones are diffusely demineralized. Telemetry leads overlie the chest. IMPRESSION: Stable exam. Marked thoracolumbar scoliosis distorts cardio thoracic anatomy limiting assessment of the lung bases. Electronically Signed   By: Misty Stanley M.D.   On: 05/03/2018 14:13        Scheduled Meds: . buPROPion  150 mg Oral BID  . chlorhexidine  15 mL Mouth Rinse BID  . Chlorhexidine Gluconate Cloth  6 each Topical Q0600  . dextromethorphan-guaiFENesin  1 tablet Oral BID  . enoxaparin (LOVENOX) injection  40 mg Subcutaneous Q24H  . ipratropium-albuterol  3 mL Nebulization TID  . mouth rinse  15 mL Mouth Rinse q12n4p  . mupirocin ointment  1 application Nasal BID  . sertraline  50 mg Oral Daily  . tamsulosin  0.4 mg Oral Daily   Continuous Infusions: . sodium chloride Stopped (05/03/18 1840)  . ceFEPime (MAXIPIME) IV Stopped (05/05/18 0523)  . vancomycin Stopped (05/04/18 1843)     LOS: 2 days     Georgette Shell, MD [facility]  If  7PM-7AM, please contact night-coverage [email or web address] Password [facility] 05/05/2018, 11:42 AM

## 2018-05-05 NOTE — CV Procedure (Signed)
Echocardiogram not completed due to other procedures. Attempted echo at one and the doctor was talking with the patient and his family. Re attempted at 2 and they are still discussing his care. Will try again tomorrow.  Darlina Sicilian RDCS

## 2018-05-06 ENCOUNTER — Inpatient Hospital Stay (HOSPITAL_COMMUNITY): Payer: Medicare Other

## 2018-05-06 DIAGNOSIS — R0602 Shortness of breath: Secondary | ICD-10-CM

## 2018-05-06 DIAGNOSIS — Z9289 Personal history of other medical treatment: Secondary | ICD-10-CM

## 2018-05-06 LAB — ECHOCARDIOGRAM COMPLETE
Height: 71 in
Weight: 2913.6 oz

## 2018-05-06 MED ORDER — PERFLUTREN LIPID MICROSPHERE
1.0000 mL | INTRAVENOUS | Status: AC | PRN
  Administered 2018-05-06: 4 mL via INTRAVENOUS
  Filled 2018-05-06: qty 10

## 2018-05-06 NOTE — Progress Notes (Signed)
Pharmacy Antibiotic Note  Manuel Reeves is a 84 y.o. male admitted on 05/03/2018 with pneumonia.  Pharmacy has been consulted for Cefepime dosing. 05/06/2018:  Day #4/7 antibiotics  Renal fxn stable at patient's baseline  Afebrile  Elevated WBC, clinically improving per CCM  Plan:  Continue Cefepime 2g IV q8h for total of 7 days per CCM (last dose would be 1/31 at 2pm)  Follow up renal function & cultures  No dose adjustments anticipated- pharmacy to sign off & monitor peripherally via electronic surveillance software  Height: 5\' 11"  (180.3 cm) Weight: 182 lb 1.6 oz (82.6 kg) IBW/kg (Calculated) : 75.3  Temp (24hrs), Avg:98.4 F (36.9 C), Min:97.5 F (36.4 C), Max:99.4 F (37.4 C)  Recent Labs  Lab 05/02/18 1608 05/03/18 1347 05/04/18 0314 05/05/18 0320  WBC 19.1* 20.7* 18.5* 19.4*  CREATININE 0.50* 0.40* 0.51* 0.58*    Estimated Creatinine Clearance: 74.5 mL/min (A) (by C-G formula based on SCr of 0.58 mg/dL (L)).    Allergies  Allergen Reactions  . Penicillins Rash    Has patient had a PCN reaction causing immediate rash, facial/tongue/throat swelling, SOB or lightheadedness with hypotension: Y Has patient had a PCN reaction causing severe rash involving mucus membranes or skin necrosis: N Has patient had a PCN reaction that required hospitalization: N Has patient had a PCN reaction occurring within the last 10 years: N If all of the above answers are "NO", then may proceed with Cephalosporin use.       Antimicrobials this admission:  12/24 Doxy x 1 12/24 Vanc >>12/27 12/24 Cefepime >>  Dose adjustments this admission:   Microbiology results:  12/25 BCx: NGTD 12/24 UCx: NGF 12/24 MRSA PCR: positive 12/24 Influenza A/B: neg/neg 12/24 Respiratory panel PCR: none detected Thank you for allowing pharmacy to be a part of this patient's care.  Netta Cedars, PharmD, BCPS 05/06/2018 9:08 AM

## 2018-05-06 NOTE — Progress Notes (Signed)
PROGRESS NOTE    Manuel Reeves  FWY:[id number] DOB: 1933/04/17 DOA: 05/03/2018 PCP: Crist Infante, MD  Brief Narrative:85 y.o.malewith medical history significant ofCLL no active treatment, osteoporosis, anxiety, depression, BPH with urinary retention awaiting prostate surgery 1230 Dr. Karsten Ro post polio syndrome associated with quadriparesis admitted with cough and increasing shortness of breath for the last few days which has been progressively getting worse. His appetite had been diminished and family reports that he is more fatigued than before. He moves around in a motorized scooter at home. Family reports he does not mount a fever most of the time because of his immunosuppressant state. Denies any nausea vomiting had diarrhea 2 days ago but has not had any since then. He was recently admitted to the hospital and discharged on December 12. At that time he was also diagnosed with pneumonia and discharged home on antibiotics. He notices that he is progressively getting worse he is not able to shrug his shoulders anymore with the post polio is overall strength and breathing is getting worse. He does not have oxygen at home.  His urologist is Dr. Karsten Ro. He was due to go for his surgery on Monday. Currently he has gross hematuria. He has a chronic Foley for the last 6 years.  Denies any chest pain. Wife reports that he takes diazepam at home for sleep and anxiety. Few days ago he had a panic attack where he thought he was going to die.  ED Course:He was placed on BiPAP since he became very hypoxic to the 70s. He got nebulizer treatments and doxycycline and prednisone in the ER. His VBG was pH 7.34 PCO2 57 PO2 70 on 50% FiO2. Sodium 130 potassium 5.1 BUN 9 creatinine 0.40 troponin was 0.03 BNP was 83 white count 20.7 hemoglobin 14.6 platelet count 318. Influenza a and B was negative. Chest x-ray showed no acute changes. Chest x-ray was unremarkable. 05/05/2018 patient seen  and examined in the room.  He is currently resting in bed in no acute distress on oxygen by nasal cannula.  He reports he had a better night.  Was not put back on BiPAP overnight.  He feels his breathing is better currently.  No family in the room at this time.  Assessment & Plan:   Active Problems:   HCAP (healthcare-associated pneumonia)   Hyponatremia   Bronchitis with bronchospasm   Acute respiratory failure with hypoxemia (HCC)   Goals of care, counseling/discussion   #1  Acute on chronic hypoxic respiratory failure secondary to Healthcare associated pneumonia --patient was initially started on vancomycin and cefepime.  Vanco DC'd today continue cefepime for 7 days.  Patient was on and off BiPAP since admission.  PCCM recommending BiPAP at night.    #2 gross hematuria resolved  He has a chronic Foley catheter at home over 6 years. And he has post polio syndrome.urine culture shows multiple species.  Patient with history of prostate cancer was pending surgery early next month.  catheter to be changed today.  #3 hyponatremiaresolved  #4 CLL chronic leukocytosis followed by Dr. Simeon Craft such.   #5 BPH continue Flomax continue Foley care  #6 anxiety and depression patient takes Wellbutrin so the Zoloft and Ativan which will be continued as soon as he is able to take p.o.   #7 post polio syndrome with quadriparesis patient has motorized scooter.  #8 constipation patient reports not having a BM  Estimated body mass index is 25.4 kg/m as calculated from the following:   Height as  of this encounter: 5\' 11"  (1.803 m).   Weight as of this encounter: 82.6 kg.  DVT prophylaxis: lovenox Code Statusdnr Family Communication:none Disposition Plan: Pending clinical improvement   Consultants: pccm   Procedures:none Antimicrobials:cefepime vanc Subjective: Feels better than yesterday Objective: Vitals:   05/06/18 0905 05/06/18 1000 05/06/18 1100 05/06/18 1200  BP: (!)  147/66 (!) 142/55 (!) 122/54 112/66  Pulse: 92 92 93 98  Resp: 17 (!) 21 (!) 21 (!) 21  Temp:    98.2 F (36.8 C)  TempSrc:    Oral  SpO2: 96% 95% 99% 96%  Weight:      Height:        Intake/Output Summary (Last 24 hours) at 05/06/2018 1311 Last data filed at 05/06/2018 1000 Gross per 24 hour  Intake 690 ml  Output 625 ml  Net 65 ml   Filed Weights   05/03/18 1254  Weight: 82.6 kg    Examination:  General exam: Appears calm and comfortable  Respiratory system: decreased breath sounds bases to auscultation. Respiratory effort normal. Cardiovascular system: S1 & S2 heard, RRR. No JVD, murmurs, rubs, gallops or clicks. No pedal edema. Gastrointestinal system: Abdomen is nondistended, soft and nontender. No organomegaly or masses felt. Normal bowel sounds heard. Extremities: No edema Skin: No rashes, lesions or ulcers Psychiatry: Judgement and insight appear normal. Mood & affect appropriate.     Data Reviewed: I have personally reviewed following labs and imaging studies  CBC: Recent Labs  Lab 05/02/18 1608 05/03/18 1347 05/04/18 0314 05/05/18 0320  WBC 19.1* 20.7* 18.5* 19.4*  HGB 15.2 14.6 14.3 13.2  HCT 46.6 45.2 45.9 42.4  MCV 99.8 98.5 102.7* 102.4*  PLT 327 318 311 585   Basic Metabolic Panel: Recent Labs  Lab 05/02/18 1608 05/03/18 1347 05/04/18 0314 05/05/18 0320  NA 133* 130* 133* 136  K 5.0 5.1 5.2* 3.8  CL 94* 91* 96* 98  CO2 31 30 24 28   GLUCOSE 113* 94 124* 111*  BUN 8 9 11 16   CREATININE 0.50* 0.40* 0.51* 0.58*  CALCIUM 9.1 8.7* 8.4* 8.2*  MG  --   --   --  1.8  PHOS  --   --   --  2.8   GFR: Estimated Creatinine Clearance: 74.5 mL/min (A) (by C-G formula based on SCr of 0.58 mg/dL (L)). Liver Function Tests: Recent Labs  Lab 05/04/18 0314  AST 15  ALT 15  ALKPHOS 101  BILITOT 1.0  PROT 5.7*  ALBUMIN 3.2*   No results for input(s): LIPASE, AMYLASE in the last 168 hours. No results for input(s): AMMONIA in the last 168  hours. Coagulation Profile: No results for input(s): INR, PROTIME in the last 168 hours. Cardiac Enzymes: Recent Labs  Lab 05/03/18 1347  TROPONINI 0.03*   BNP (last 3 results) No results for input(s): PROBNP in the last 8760 hours. HbA1C: No results for input(s): HGBA1C in the last 72 hours. CBG: No results for input(s): GLUCAP in the last 168 hours. Lipid Profile: No results for input(s): CHOL, HDL, LDLCALC, TRIG, CHOLHDL, LDLDIRECT in the last 72 hours. Thyroid Function Tests: No results for input(s): TSH, T4TOTAL, FREET4, T3FREE, THYROIDAB in the last 72 hours. Anemia Panel: No results for input(s): VITAMINB12, FOLATE, FERRITIN, TIBC, IRON, RETICCTPCT in the last 72 hours. Sepsis Labs: Recent Labs  Lab 05/04/18 1207  PROCALCITON <0.10    Recent Results (from the past 240 hour(s))  Culture, Urine     Status: None   Collection Time: 05/03/18  5:33 PM  Result Value Ref Range Status   Specimen Description   Final    URINE, RANDOM Performed at [facility] [address], [place], [place] [address]    Special Requests   Final    NONE Performed at [facility], [place] [address], [place], [place] [address]    Culture   Final    Multiple bacterial morphotypes present, none predominant. Suggest appropriate recollection if clinically indicated.   Report Status 05/05/2018 FINAL  Final  MRSA PCR Screening     Status: Abnormal   Collection Time: 05/03/18  5:33 PM  Result Value Ref Range Status   MRSA by PCR POSITIVE (A) NEGATIVE Final    Comment:        The GeneXpert MRSA Assay (FDA approved for NASAL specimens only), is one component of a comprehensive MRSA colonization surveillance program. It is not intended to diagnose MRSA infection nor to guide or monitor treatment for MRSA infections. RESULT CALLED TO, READ BACK BY AND VERIFIED WITH: S.LANEY AT 2026 ON 05/03/18 BY N.THOMPSON Performed at [facility], [place] [address] [place], [place] [address]   Respiratory Panel by PCR     Status: None   Collection Time: 05/03/18  7:09 PM  Result Value Ref Range Status   Adenovirus NOT DETECTED NOT DETECTED Final   Coronavirus 229E NOT DETECTED NOT DETECTED Final   Coronavirus HKU1 NOT DETECTED NOT DETECTED Final   Coronavirus NL63 NOT DETECTED NOT DETECTED Final   Coronavirus OC43 NOT DETECTED NOT DETECTED Final   Metapneumovirus NOT DETECTED NOT DETECTED Final   Rhinovirus / Enterovirus NOT DETECTED NOT DETECTED Final   Influenza A NOT DETECTED NOT DETECTED Final   Influenza B NOT DETECTED NOT DETECTED Final   Parainfluenza Virus 1 NOT DETECTED NOT DETECTED Final   Parainfluenza Virus 2 NOT DETECTED NOT DETECTED Final   Parainfluenza Virus 3 NOT DETECTED NOT DETECTED Final   Parainfluenza Virus 4 NOT DETECTED NOT DETECTED Final   Respiratory Syncytial Virus NOT DETECTED NOT DETECTED Final   Bordetella pertussis NOT DETECTED NOT DETECTED Final   Chlamydophila pneumoniae NOT DETECTED NOT DETECTED Final   Mycoplasma pneumoniae NOT DETECTED NOT DETECTED Final    Comment: Performed at [facility], [place] [address], [place], [place] [address]  Culture, blood (Routine X 2) w Reflex to ID Panel     Status: None (Preliminary result)   Collection Time: 05/04/18 11:54 AM  Result Value Ref Range Status   Specimen Description   Final    BLOOD RIGHT ANTECUBITAL Performed at [facility] [address] [place], [place] [address]    Special Requests   Final    BOTTLES DRAWN AEROBIC ONLY Blood Culture results may not be optimal due to an inadequate volume of blood received in culture bottles Performed at [facility] [address], [place], [place] [address]    Culture   Final    NO GROWTH 2 DAYS Performed at [facility] [address] [place], [place] [address]    Report Status PENDING  Incomplete  Culture, blood (Routine X 2) w Reflex to ID Panel      Status: None (Preliminary result)   Collection Time: 05/04/18 12:07 PM  Result Value Ref Range Status   Specimen Description   Final    BLOOD RIGHT HAND Performed at [facility] [address], [place], [place] [address]  Special Requests   Final    BOTTLES DRAWN AEROBIC ONLY Blood Culture adequate volume Performed at [facility] [address] [place], [place] [address]    Culture   Final    NO GROWTH 2 DAYS Performed at [facility] [address] [place], [place] [address]    Report Status PENDING  Incomplete         Radiology Studies: Dg Chest Port 1 View  Result Date: 05/05/2018 CLINICAL DATA:  Endotracheal tube placement EXAM: PORTABLE CHEST 1 VIEW COMPARISON:  05/04/2018 FINDINGS: Endotracheal tube not present. Marked scoliosis and chest deformity. Bibasilar atelectasis/infiltrate unchanged with small right effusion. IMPRESSION: No significant change bibasilar atelectasis/infiltrate. Electronically Signed   By: Franchot Gallo M.D.   On: 05/05/2018 07:50        Scheduled Meds: . buPROPion  150 mg Oral BID  . chlorhexidine  15 mL Mouth Rinse BID  . Chlorhexidine Gluconate Cloth  6 each Topical Q0600  . dextromethorphan-guaiFENesin  1 tablet Oral BID  . enoxaparin (LOVENOX) injection  40 mg Subcutaneous Q24H  . ipratropium-albuterol  3 mL Nebulization TID  . mouth rinse  15 mL Mouth Rinse q12n4p  . mupirocin ointment  1 application Nasal BID  . polyethylene glycol  17 g Oral Daily  . sertraline  50 mg Oral Daily  . tamsulosin  0.4 mg Oral Daily   Continuous Infusions: . sodium chloride 10 mL/hr at 05/05/18 1747  . ceFEPime (MAXIPIME) IV Stopped (05/05/18 2105)     LOS: 3 days     Georgette Shell, MD [facility]  If 7PM-7AM, please contact night-coverage [email or web address] Password [email or web address] 05/06/2018, 1:11 PM

## 2018-05-06 NOTE — Progress Notes (Signed)
  Echocardiogram 2D Echocardiogram has been performed.  Madelaine Etienne 05/06/2018, 1:55 PM

## 2018-05-06 NOTE — Progress Notes (Addendum)
NAME:  ERMIN PARISIEN, MRN:  [id number], DOB:  10-Mar-1933, LOS: 3 ADMISSION DATE:  05/03/2018, CONSULTATION DATE:  05/04/2018 REFERRING MD:  TRH-Mathews, CHIEF COMPLAINT:  Acute on chronic hypoxemic respiratory failure   Brief History   84 year old male with PMH of quad due to post polio syndrome and CLL who was recently hospitalized for pneumonia on 12/12 who returns with worsening weakness, and hypoxemic respiratory failure requiring BiPAP.  Patient was started on cefepime and vanc and cultures were drawn and subsequently he was successfully liberated from the BiPAP but on 12/25 he started having increase in RR and WOB and was placed back on BiPAP.  PCCM was consulted for assistance with BiPAP management.  Patient is arousble but on BiPAP and difficult to obtain history, no family bedside.  TRH-MD spoke with family on 12/24 who reported patient is full code and thus PCCM was consulted.   Past Medical History  Post polio quad CLL with no treatment Chronic foley  Prostate cancer awaiting surgery   Significant Hospital Events   12/24 admission with BiPAP for HCAP on the RLL 12/25 PCCM consult for BiPAP management 12/26: Improved, DNR status discussed, MOST form filled out 12/27 feels better  Consults:  PCCM  Procedures:  None  Significant Diagnostic Tests:  CXR 12/24 and 12/25 that I reviewed myself that showed RLL infiltrate but is very difficult to interpret due to scoliosis  Micro Data:  Urine 12/24>>> MRSA 12/24 positive  RVP 12/24 pending Blood 12/24>>>  Antimicrobials:  Cefepime 12/24>>> Vancomycin 12/24>>>12/27  Interim history/subjective:  Increased WOB this AM  Objective   Blood pressure 140/81, pulse 87, temperature (Abnormal) 97.5 F (36.4 C), temperature source Oral, resp. rate 18, height 5\' 11"  (1.803 m), weight 82.6 kg, SpO2 96 %.    FiO2 (%):  [50 %-60 %] 50 %   Intake/Output Summary (Last 24 hours) at 05/06/2018 0841 Last data filed at 05/06/2018  0400 Gross per 24 hour  Intake 950 ml  Output 625 ml  Net 325 ml   Filed Weights   05/03/18 1254  Weight: 82.6 kg    Examination: General: This is a pleasant 84 year old white male who is resting comfortably in bed he is in no acute distress HEENT normocephalic atraumatic no jugular venous distention mucous membranes are moist Pulmonary: Diminished bilaterally, no accessory use Cardiac: Regular rate and rhythm without murmur rub or gallop Abdomen: Soft nontender no organomegaly Extremities: Lower extremity muscle wasting, upper extremity brisk cap refill, good biceps strength no shoulder or trapezius strength Neuro: Slightly confused this morning, seems to be improving with ongoing conversation no other focal deficits has baseline weakness  Resolved Hospital Problem list   N/A  Assessment & Plan:  84 year old male quad from post polio with HCAP, discussed with TRH-MD  Acute on chronic hypoxemic respiratory failure due to pneumonia and progressive weakness +/- element of volume overload.   History of tobacco abuse/COPD -Given his neuromuscular weakness from underlying postpolio syndrome I think he would benefit from noninvasive ventilation at at bedtime Plan Antibiotic day #4, currently on cefepime and vancomycin, will discontinue the vancomycin and complete a total of 7 days therapy  Wean oxygen  Diuresis  Ensure we treat constipation  Nocturnal BiPAP  Pulmonary hygiene  Bronchodilators  DO NOT RESUSCITATE DO NOT INTUBATE   Hematuria: -Urine culture with multiple organisms Plan Reculture sent We will change Foley catheter today, I contacted his urologist  Hyponatremia: Improved with diuresis Hyperkalemia Plan Continuing IV Lasix  A.m. chemistry  Constipation Plan Bowel regimen started To important to treat constipation given his already limited pulmonary function due to his neuromuscular disease   Disposition: Clinically he looks much better.  He is now off  oxygen this morning.  Tolerating nocturnal BiPAP.  Clinically seems to be responding to antimicrobial therapy I think we can discontinue the vancomycin.  Overall as noted before his bigger issue is his neuromuscular disease, and fatigue.  Have ordered a trilogy nocturnal noninvasive ventilator for home.  I think his wife will need some training with this prior to discharge.  From a pulmonary standpoint he is ready to transfer to the medical ward pulmonary and critical care will be available as needed but formally sign off today.  I have contacted his primary care provider, will defer to him as to whether or not he would like for [place] to follow outpatient  [name] ACNP-BC [place] Pager # [phone] OR # [phone] if no answer  Attending Note:  84 year old male with post polio syndrome resulting in quadriplegia that developed respiratory failure from pulmonary edema, HCAP and progressive weakness of his quad state.  On exam, he is off O2 this morning with coarse BS diffusely.  I reviewed CXR myself, infiltrate noted.  Discussed with PCCM-NP.  Acute on chronic respiratory failure:  - Trilogy arranged for home  - BiPAP at night  Hypoxemia:  - Titrate O2 for sat of 88-92%  HCAP:  - D/C vanc  - Cefepime  - F/u on cultures  GOC: discussion with family, DNR status.  Patient seen and examined, agree with above note.  I dictated the care and orders written for this patient under my direction.  Rush Farmer, MD [phone]

## 2018-05-07 ENCOUNTER — Inpatient Hospital Stay (HOSPITAL_COMMUNITY): Payer: Medicare Other

## 2018-05-07 MED ORDER — BISACODYL 5 MG PO TBEC
10.0000 mg | DELAYED_RELEASE_TABLET | Freq: Every day | ORAL | Status: DC
  Administered 2018-05-07 – 2018-05-09 (×2): 10 mg via ORAL
  Filled 2018-05-07 (×2): qty 2

## 2018-05-07 NOTE — Progress Notes (Signed)
PT. Was put on bipap at 0930 and has remained on bipap for the remainder of the evening. Awaiting morning shift nurse to take off. RN will continue to monitor pt.

## 2018-05-07 NOTE — Progress Notes (Signed)
PROGRESS NOTE    Manuel Reeves  XIP:[id number] DOB: 1932/11/26 DOA: 05/03/2018 PCP: Crist Infante, MD   Brief Narrative: 84 y.o.malewith medical history significant ofCLL no active treatment, osteoporosis, anxiety, depression, BPH with urinary retention awaiting prostate surgery 1230 Dr. Karsten Ro post polio syndrome associated with quadriparesis admitted with cough and increasing shortness of breath for the last few days which has been progressively getting worse. His appetite had been diminished and family reports that he is more fatigued than before. He moves around in a motorized scooter at home. Family reports he does not mount a fever most of the time because of his immunosuppressant state. Denies any nausea vomiting had diarrhea 2 days ago but has not had any since then. He was recently admitted to the hospital and discharged on December 12. At that time he was also diagnosed with pneumonia and discharged home on antibiotics. He notices that he is progressively getting worse he is not able to shrug his shoulders anymore with the post polio is overall strength and breathing is getting worse. He does not have oxygen at home.  His urologist is Dr. Karsten Ro. He was due to go for his surgery on Monday. Currently he has gross hematuria. He has a chronic Foley for the last 6 years.  Denies any chest pain. Wife reports that he takes diazepam at home for sleep and anxiety. Few days ago he had a panic attack where he thought he was going to die.  ED Course:He was placed on BiPAP since he became very hypoxic to the 70s. He got nebulizer treatments and doxycycline and prednisone in the ER. His VBG was pH 7.34 PCO2 57 PO2 70 on 50% FiO2. Sodium 130 potassium 5.1 BUN 9 creatinine 0.40 troponin was 0.03 BNP was 83 white count 20.7 hemoglobin 14.6 platelet count 318. Influenza a and B was negative. Chest x-ray showed no acute changes. Chest x-ray was unremarkable. 05/05/2018 patient  seen and examined in the room. He is currently resting in bed in no acute distress on oxygen by nasal cannula. He reports he had a better night. Was not put back on BiPAP overnight. He feels his breathing is better currently. No family in the room at this time. Assessment & Plan:   Active Problems:   HCAP (healthcare-associated pneumonia)   Hyponatremia   Bronchitis with bronchospasm   Acute respiratory failure with hypoxemia (HCC)   Goals of care, counseling/discussion   History of ETT  #1Acute on chronic hypoxic respiratory failure secondary toHealthcare associated pneumonia --patient was initially started on vancomycin and cefepime.  continue cefepime for 7 days.  Patient was on and off BiPAP since admission.  PCCM recommending BiPAP at night.    PCCM has ordered a trilogy  nocturnal noninvasive ventilator for home.  #2 gross hematuriaresolved.foley changed 12/27  #3 hyponatremiaresolved  #4 CLL chronic leukocytosis followed by Dr. Simeon Craft such.   #5 BPH continue Flomax continue Foley care  #6 anxiety and depression patient takes Wellbutrin so the Zoloft and Ativan which will be continued as soon as he is able to take p.o.   #7 post polio syndrome with quadriparesis patient has motorized scooter.  #8 constipation patient reports not having a BM.start stool softners       Estimated body mass index is 25.4 kg/m as calculated from the following:   Height as of this encounter: 5\' 11"  (1.803 m).   Weight as of this encounter: 82.6 kg.  DVT prophylaxis: lovenox Code Status: dnr Family Communication:none today Disposition  Plan:pending clinical improvement  Consultants: pccm  Procedures: none Antimicrobials:  cefepime Subjective: Resting in bed BiPAP on appears comfortable BiPAP BiPAP was placed last night  Objective: Vitals:   05/07/18 0700 05/07/18 0756 05/07/18 0800 05/07/18 0820  BP: 132/73  (!) 118/51   Pulse: 81  78   Resp: 18  14   Temp:     98 F (36.7 C)  TempSrc:    Axillary  SpO2: 100% 100% 100%   Weight:      Height:        Intake/Output Summary (Last 24 hours) at 05/07/2018 1109 Last data filed at 05/07/2018 0600 Gross per 24 hour  Intake 647.93 ml  Output 125 ml  Net 522.93 ml   Filed Weights   05/03/18 1254  Weight: 82.6 kg    Examination:  General exam: Appears calm and comfortable  Respiratory system: Diminished breath sounds bilaterally to auscultation. Respiratory effort normal. Cardiovascular system: S1 & S2 heard, RRR. No JVD, murmurs, rubs, gallops or clicks. No pedal edema. Gastrointestinal system: Abdomen is nondistended, soft and nontender. No organomegaly or masses felt. Normal bowel sounds heard. Central nervous system: Alert and oriented. No focal neurological deficits. Extremities: Symmetric 5 x 5 power. Skin: No rashes, lesions or ulcers Psychiatry: Judgement and insight appear normal. Mood & affect appropriate.     Data Reviewed: I have personally reviewed following labs and imaging studies  CBC: Recent Labs  Lab 05/02/18 1608 05/03/18 1347 05/04/18 0314 05/05/18 0320  WBC 19.1* 20.7* 18.5* 19.4*  HGB 15.2 14.6 14.3 13.2  HCT 46.6 45.2 45.9 42.4  MCV 99.8 98.5 102.7* 102.4*  PLT 327 318 311 161   Basic Metabolic Panel: Recent Labs  Lab 05/02/18 1608 05/03/18 1347 05/04/18 0314 05/05/18 0320  NA 133* 130* 133* 136  K 5.0 5.1 5.2* 3.8  CL 94* 91* 96* 98  CO2 31 30 24 28   GLUCOSE 113* 94 124* 111*  BUN 8 9 11 16   CREATININE 0.50* 0.40* 0.51* 0.58*  CALCIUM 9.1 8.7* 8.4* 8.2*  MG  --   --   --  1.8  PHOS  --   --   --  2.8   GFR: Estimated Creatinine Clearance: 74.5 mL/min (A) (by C-G formula based on SCr of 0.58 mg/dL (L)). Liver Function Tests: Recent Labs  Lab 05/04/18 0314  AST 15  ALT 15  ALKPHOS 101  BILITOT 1.0  PROT 5.7*  ALBUMIN 3.2*   No results for input(s): LIPASE, AMYLASE in the last 168 hours. No results for input(s): AMMONIA in the last 168  hours. Coagulation Profile: No results for input(s): INR, PROTIME in the last 168 hours. Cardiac Enzymes: Recent Labs  Lab 05/03/18 1347  TROPONINI 0.03*   BNP (last 3 results) No results for input(s): PROBNP in the last 8760 hours. HbA1C: No results for input(s): HGBA1C in the last 72 hours. CBG: No results for input(s): GLUCAP in the last 168 hours. Lipid Profile: No results for input(s): CHOL, HDL, LDLCALC, TRIG, CHOLHDL, LDLDIRECT in the last 72 hours. Thyroid Function Tests: No results for input(s): TSH, T4TOTAL, FREET4, T3FREE, THYROIDAB in the last 72 hours. Anemia Panel: No results for input(s): VITAMINB12, FOLATE, FERRITIN, TIBC, IRON, RETICCTPCT in the last 72 hours. Sepsis Labs: Recent Labs  Lab 05/04/18 1207  PROCALCITON <0.10    Recent Results (from the past 240 hour(s))  Culture, Urine     Status: None   Collection Time: 05/03/18  5:33 PM  Result Value Ref Range  Status   Specimen Description   Final    URINE, RANDOM Performed at [facility] [address] [place], [place] [address]    Special Requests   Final    NONE Performed at [facility], [place] [address], [place], [place] [address]    Culture   Final    Multiple bacterial morphotypes present, none predominant. Suggest appropriate recollection if clinically indicated.   Report Status 05/05/2018 FINAL  Final  MRSA PCR Screening     Status: Abnormal   Collection Time: 05/03/18  5:33 PM  Result Value Ref Range Status   MRSA by PCR POSITIVE (A) NEGATIVE Final    Comment:        The GeneXpert MRSA Assay (FDA approved for NASAL specimens only), is one component of a comprehensive MRSA colonization surveillance program. It is not intended to diagnose MRSA infection nor to guide or monitor treatment for MRSA infections. RESULT CALLED TO, READ BACK BY AND VERIFIED WITH: S.LANEY AT 2026 ON 05/03/18 BY N.THOMPSON Performed at [facility], [place] [address] [place], [place] [address]   Respiratory Panel by PCR     Status: None   Collection Time: 05/03/18  7:09 PM  Result Value Ref Range Status   Adenovirus NOT DETECTED NOT DETECTED Final   Coronavirus 229E NOT DETECTED NOT DETECTED Final   Coronavirus HKU1 NOT DETECTED NOT DETECTED Final   Coronavirus NL63 NOT DETECTED NOT DETECTED Final   Coronavirus OC43 NOT DETECTED NOT DETECTED Final   Metapneumovirus NOT DETECTED NOT DETECTED Final   Rhinovirus / Enterovirus NOT DETECTED NOT DETECTED Final   Influenza A NOT DETECTED NOT DETECTED Final   Influenza B NOT DETECTED NOT DETECTED Final   Parainfluenza Virus 1 NOT DETECTED NOT DETECTED Final   Parainfluenza Virus 2 NOT DETECTED NOT DETECTED Final   Parainfluenza Virus 3 NOT DETECTED NOT DETECTED Final   Parainfluenza Virus 4 NOT DETECTED NOT DETECTED Final   Respiratory Syncytial Virus NOT DETECTED NOT DETECTED Final   Bordetella pertussis NOT DETECTED NOT DETECTED Final   Chlamydophila pneumoniae NOT DETECTED NOT DETECTED Final   Mycoplasma pneumoniae NOT DETECTED NOT DETECTED Final    Comment: Performed at [facility], [place] [address], [place], [place] [address]  Culture, blood (Routine X 2) w Reflex to ID Panel     Status: None (Preliminary result)   Collection Time: 05/04/18 11:54 AM  Result Value Ref Range Status   Specimen Description   Final    BLOOD RIGHT ANTECUBITAL Performed at [facility] [address], [place], [place] [address]    Special Requests   Final    BOTTLES DRAWN AEROBIC ONLY Blood Culture results may not be optimal due to an inadequate volume of blood received in culture bottles Performed at [facility] [address] [place], [place] [address]    Culture   Final    NO GROWTH 3 DAYS Performed at [facility], [place] [address], [place], [place] [address]    Report Status PENDING  Incomplete  Culture, blood (Routine X 2) w Reflex to ID Panel      Status: None (Preliminary result)   Collection Time: 05/04/18 12:07 PM  Result Value Ref Range Status   Specimen Description   Final    BLOOD RIGHT HAND Performed at [facility] [address] [place],  [address]    Special Requests   Final  BOTTLES DRAWN AEROBIC ONLY Blood Culture adequate volume Performed at [facility] [address], [place], [place] [address]    Culture   Final    NO GROWTH 3 DAYS Performed at [facility], [place] [address], [place], [place] [address]    Report Status PENDING  Incomplete         Radiology Studies: No results found.      Scheduled Meds: . buPROPion  150 mg Oral BID  . chlorhexidine  15 mL Mouth Rinse BID  . Chlorhexidine Gluconate Cloth  6 each Topical Q0600  . dextromethorphan-guaiFENesin  1 tablet Oral BID  . enoxaparin (LOVENOX) injection  40 mg Subcutaneous Q24H  . ipratropium-albuterol  3 mL Nebulization TID  . mouth rinse  15 mL Mouth Rinse q12n4p  . mupirocin ointment  1 application Nasal BID  . polyethylene glycol  17 g Oral Daily  . sertraline  50 mg Oral Daily  . tamsulosin  0.4 mg Oral Daily   Continuous Infusions: . sodium chloride 10 mL/hr at 05/05/18 1747  . ceFEPime (MAXIPIME) IV Stopped (05/07/18 0526)     LOS: 4 days     Georgette Shell, MD [facility]  If 7PM-7AM, please contact night-coverage [email or web address] Password [email or web address] 05/07/2018, 11:09 AM

## 2018-05-08 LAB — BASIC METABOLIC PANEL
ANION GAP: 7 (ref 5–15)
BUN: 17 mg/dL (ref 8–23)
CO2: 31 mmol/L (ref 22–32)
Calcium: 8.7 mg/dL — ABNORMAL LOW (ref 8.9–10.3)
Chloride: 97 mmol/L — ABNORMAL LOW (ref 98–111)
Creatinine, Ser: 0.52 mg/dL — ABNORMAL LOW (ref 0.61–1.24)
GFR calc non Af Amer: >60 mL/min (ref >60)
Glucose, Bld: 108 mg/dL — ABNORMAL HIGH (ref 70–99)
Potassium: 3.7 mmol/L (ref 3.5–5.1)
Sodium: 135 mmol/L (ref 135–145)

## 2018-05-08 LAB — CBC
HCT: 37.7 % — ABNORMAL LOW (ref 39.0–52.0)
Hemoglobin: 11.7 g/dL — ABNORMAL LOW (ref 13.0–17.0)
MCH: 31.5 pg (ref 26.0–34.0)
MCHC: 31 g/dL (ref 30.0–36.0)
MCV: 101.6 fL — ABNORMAL HIGH (ref 80.0–100.0)
Platelets: 252 10*3/uL (ref 150–400)
RBC: 3.71 MIL/uL — ABNORMAL LOW (ref 4.22–5.81)
RDW: 13.9 % (ref 11.5–15.5)
WBC: 15.6 10*3/uL — AB (ref 4.0–10.5)
nRBC: 0 % (ref 0.0–0.2)

## 2018-05-08 MED ORDER — AMOXICILLIN-POT CLAVULANATE 875-125 MG PO TABS: 1.0000 | ORAL_TABLET | Freq: Two times a day (BID) | ORAL | Status: DC

## 2018-05-08 MED ORDER — CEFDINIR 300 MG PO CAPS
300.0000 mg | ORAL_CAPSULE | Freq: Two times a day (BID) | ORAL | Status: AC
  Administered 2018-05-08 – 2018-05-10 (×5): 300 mg via ORAL
  Filled 2018-05-08 (×5): qty 1

## 2018-05-08 NOTE — Progress Notes (Signed)
Pt becoming confused at times. Noticed by his wife, and Therapist, sports. Suggested that pt wear his BIPAP for a short period of time. Hypercapnia being a concern with this pt. RT will continue to monitor.

## 2018-05-08 NOTE — Progress Notes (Addendum)
PROGRESS NOTE    Manuel Reeves  GXQ:[id number] DOB: 10/17/1932 DOA: 05/03/2018 PCP: Crist Infante, MD    Brief Narrative: 84 y.o.malewith medical history significant ofCLL no active treatment, osteoporosis, anxiety, depression, BPH with urinary retention awaiting prostate surgery 1230 Dr. Karsten Ro post polio syndrome associated with quadriparesis admitted with cough and increasing shortness of breath for the last few days which has been progressively getting worse. His appetite had been diminished and family reports that he is more fatigued than before. He moves around in a motorized scooter at home. Family reports he does not mount a fever most of the time because of his immunosuppressant state. Denies any nausea vomiting had diarrhea 2 days ago but has not had any since then. He was recently admitted to the hospital and discharged on December 12. At that time he was also diagnosed with pneumonia and discharged home on antibiotics. He notices that he is progressively getting worse he is not able to shrug his shoulders anymore with the post polio is overall strength and breathing is getting worse. He does not have oxygen at home.  His urologist is Dr. Karsten Ro. He was due to go for his surgery on Monday. Currently he has gross hematuria. He has a chronic Foley for the last 6 years.  Denies any chest pain. Wife reports that he takes diazepam at home for sleep and anxiety. Few days ago he had a panic attack where he thought he was going to die.  ED Course:He was placed on BiPAP since he became very hypoxic to the 70s. He got nebulizer treatments and doxycycline and prednisone in the ER. His VBG was pH 7.34 PCO2 57 PO2 70 on 50% FiO2. Sodium 130 potassium 5.1 BUN 9 creatinine 0.40 troponin was 0.03 BNP was 83 white count 20.7 hemoglobin 14.6 platelet count 318. Influenza a and B was negative. Chest x-ray showed no acute changes. Chest x-ray was unremarkable. 05/05/2018 patient  seen and examined in the room. He is currently resting in bed in no acute distress on oxygen by nasal cannula. He reports he had a better night. Was not put back on BiPAP overnight. He feels his breathing is better currently. No family in the room at this time. Assessment & Plan:   Active Problems:   HCAP (healthcare-associated pneumonia)   Hyponatremia   Bronchitis with bronchospasm   Acute respiratory failure with hypoxemia (HCC)   Goals of care, counseling/discussion   History of ETT  #1Acute on chronic hypoxic respiratory failure secondary toHealthcare associated pneumonia--patient was initially started on vancomycin and cefepime.    Vanco has been stopped cefepime will be stopped 1229 and start cefdinir and finish the course of total of 7 days till 05/10/2018.Patient was on and off BiPAP since admission. PCCM recommending BiPAP at night.   PCCM has ordered a trilogy  nocturnal noninvasive ventilator for home which hopefully can be arranged on Monday.  Patient has recurrent admission to the hospital with pneumonia COPD and post polio syndrome with muscle weakness.  In order to prevent this patient needs a trilogy noninvasive ventilator at home to be used at night.  #2 gross hematuriaresolved.foley changed 12/27.  Patient has chronic Foley followed by Dr. Vira Blanco.  #3 hyponatremiaresolved  #4 CLL chronic leukocytosis followed by Dr. Simeon Craft such.   #5 BPH and prostate cancer- continue Flomax continue Foley care  #6 anxiety and depression patient takes Wellbutrin so the Zoloft and Ativan which will be continued as soon as he is able to take p.o.   #  7 post polio syndrome with quadriparesis patient has motorized scooter.  #8 constipation patient reports not having a BM.start stool softners  Estimated body mass index is 25.4 kg/m as calculated from the following:   Height as of this encounter: 5\' 11"  (1.803 m).   Weight as of this encounter: 82.6 kg.  DVT  prophylaxis: Lovenox Code Status: DO NOT RESUSCITATE Family Communication: Will discuss with family today Disposition Plan: Patient needs to neurology nocturnal noninvasive ventilator at home to prevent readmissions to the hospital which can be arranged on Monday.  PCCM is arranging this.  Consultants:   PCCM  Procedures: None Antimicrobials: Cefepime  Subjective: Patient resting in bed appears comfortable on the BiPAP.  Has been using BiPAP every night.  Staff reports no further issues.  Objective: Vitals:   05/08/18 0700 05/08/18 0800 05/08/18 0900 05/08/18 1000  BP: 127/75 (!) 147/67 (!) 167/87 (!) 150/63  Pulse: 74 70 86 81  Resp: 17 17 20 19   Temp:  97.6 F (36.4 C)    TempSrc:  Oral    SpO2: 100% 100% 100% 97%  Weight:      Height:        Intake/Output Summary (Last 24 hours) at 05/08/2018 1045 Last data filed at 05/08/2018 1000 Gross per 24 hour  Intake 437.81 ml  Output 1450 ml  Net -1012.19 ml   Filed Weights   05/03/18 1254  Weight: 82.6 kg    Examination:  General exam: Appears calm and comfortable  Respiratory system: Diminished breath sounds at the bases.  To auscultation. Respiratory effort normal. Cardiovascular system: S1 & S2 heard, RRR. No JVD, murmurs, rubs, gallops or clicks. No pedal edema. Gastrointestinal system: Abdomen is nondistended, soft and nontender. No organomegaly or masses felt. Normal bowel sounds heard. Central nervous system: Alert and oriented. No focal neurological deficits. Extremities: Symmetric 5 x 5 power. Skin: No rashes, lesions or ulcers Psychiatry: Judgement and insight appear normal. Mood & affect appropriate.     Data Reviewed: I have personally reviewed following labs and imaging studies  CBC: Recent Labs  Lab 05/02/18 1608 05/03/18 1347 05/04/18 0314 05/05/18 0320 05/08/18 0337  WBC 19.1* 20.7* 18.5* 19.4* 15.6*  HGB 15.2 14.6 14.3 13.2 11.7*  HCT 46.6 45.2 45.9 42.4 37.7*  MCV 99.8 98.5 102.7* 102.4*  101.6*  PLT 327 318 311 250 902   Basic Metabolic Panel: Recent Labs  Lab 05/02/18 1608 05/03/18 1347 05/04/18 0314 05/05/18 0320 05/08/18 0337  NA 133* 130* 133* 136 135  K 5.0 5.1 5.2* 3.8 3.7  CL 94* 91* 96* 98 97*  CO2 31 30 24 28 31   GLUCOSE 113* 94 124* 111* 108*  BUN 8 9 11 16 17   CREATININE 0.50* 0.40* 0.51* 0.58* 0.52*  CALCIUM 9.1 8.7* 8.4* 8.2* 8.7*  MG  --   --   --  1.8  --   PHOS  --   --   --  2.8  --    GFR: Estimated Creatinine Clearance: 74.5 mL/min (A) (by C-G formula based on SCr of 0.52 mg/dL (L)). Liver Function Tests: Recent Labs  Lab 05/04/18 0314  AST 15  ALT 15  ALKPHOS 101  BILITOT 1.0  PROT 5.7*  ALBUMIN 3.2*   No results for input(s): LIPASE, AMYLASE in the last 168 hours. No results for input(s): AMMONIA in the last 168 hours. Coagulation Profile: No results for input(s): INR, PROTIME in the last 168 hours. Cardiac Enzymes: Recent Labs  Lab 05/03/18 1347  TROPONINI 0.03*   BNP (last 3 results) No results for input(s): PROBNP in the last 8760 hours. HbA1C: No results for input(s): HGBA1C in the last 72 hours. CBG: No results for input(s): GLUCAP in the last 168 hours. Lipid Profile: No results for input(s): CHOL, HDL, LDLCALC, TRIG, CHOLHDL, LDLDIRECT in the last 72 hours. Thyroid Function Tests: No results for input(s): TSH, T4TOTAL, FREET4, T3FREE, THYROIDAB in the last 72 hours. Anemia Panel: No results for input(s): VITAMINB12, FOLATE, FERRITIN, TIBC, IRON, RETICCTPCT in the last 72 hours. Sepsis Labs: Recent Labs  Lab 05/04/18 1207  PROCALCITON <0.10    Recent Results (from the past 240 hour(s))  Culture, Urine     Status: None   Collection Time: 05/03/18  5:33 PM  Result Value Ref Range Status   Specimen Description   Final    URINE, RANDOM Performed at [facility] [address], [place], [place] [address]    Special Requests   Final    NONE Performed at [facility],  [place] [address], [place], [place] [address]    Culture   Final    Multiple bacterial morphotypes present, none predominant. Suggest appropriate recollection if clinically indicated.   Report Status 05/05/2018 FINAL  Final  MRSA PCR Screening     Status: Abnormal   Collection Time: 05/03/18  5:33 PM  Result Value Ref Range Status   MRSA by PCR POSITIVE (A) NEGATIVE Final    Comment:        The GeneXpert MRSA Assay (FDA approved for NASAL specimens only), is one component of a comprehensive MRSA colonization surveillance program. It is not intended to diagnose MRSA infection nor to guide or monitor treatment for MRSA infections. RESULT CALLED TO, READ BACK BY AND VERIFIED WITH: S.LANEY AT 2026 ON 05/03/18 BY N.THOMPSON Performed at [facility], [place] [address] [place], [place] [address]   Respiratory Panel by PCR     Status: None   Collection Time: 05/03/18  7:09 PM  Result Value Ref Range Status   Adenovirus NOT DETECTED NOT DETECTED Final   Coronavirus 229E NOT DETECTED NOT DETECTED Final   Coronavirus HKU1 NOT DETECTED NOT DETECTED Final   Coronavirus NL63 NOT DETECTED NOT DETECTED Final   Coronavirus OC43 NOT DETECTED NOT DETECTED Final   Metapneumovirus NOT DETECTED NOT DETECTED Final   Rhinovirus / Enterovirus NOT DETECTED NOT DETECTED Final   Influenza A NOT DETECTED NOT DETECTED Final   Influenza B NOT DETECTED NOT DETECTED Final   Parainfluenza Virus 1 NOT DETECTED NOT DETECTED Final   Parainfluenza Virus 2 NOT DETECTED NOT DETECTED Final   Parainfluenza Virus 3 NOT DETECTED NOT DETECTED Final   Parainfluenza Virus 4 NOT DETECTED NOT DETECTED Final   Respiratory Syncytial Virus NOT DETECTED NOT DETECTED Final   Bordetella pertussis NOT DETECTED NOT DETECTED Final   Chlamydophila pneumoniae NOT DETECTED NOT DETECTED Final   Mycoplasma pneumoniae NOT DETECTED NOT DETECTED Final    Comment: Performed at [facility], [place] [address]  [place], [place] [address]  Culture, blood (Routine X 2) w Reflex to ID Panel     Status: None (Preliminary result)   Collection Time: 05/04/18 11:54 AM  Result Value Ref Range Status   Specimen Description   Final    BLOOD RIGHT ANTECUBITAL Performed at [facility] [address], [place], [place] [address]    Special Requests   Final    BOTTLES DRAWN AEROBIC ONLY  Blood Culture results may not be optimal due to an inadequate volume of blood received in culture bottles Performed at [facility] [address], [place], [place] [address]    Culture   Final    NO GROWTH 3 DAYS Performed at [facility], [place] [address] [place], [place] [address]    Report Status PENDING  Incomplete  Culture, blood (Routine X 2) w Reflex to ID Panel     Status: None (Preliminary result)   Collection Time: 05/04/18 12:07 PM  Result Value Ref Range Status   Specimen Description   Final    BLOOD RIGHT HAND Performed at [facility] [address], [place], [place] [address]    Special Requests   Final    BOTTLES DRAWN AEROBIC ONLY Blood Culture adequate volume Performed at [facility] [address] [place], [place] [address]    Culture   Final    NO GROWTH 3 DAYS Performed at [facility], [place] [address] [place],  [address]    Report Status PENDING  Incomplete         Radiology Studies: Dg Chest 1 View  Result Date: 05/07/2018 CLINICAL DATA:  Progressively with worsening shortness of breath associated with cough over the past several days. Recent hospitalization for pneumonia, discharged on 04/21/2018. Personal history of poliomyelitis. EXAM: Portable CHEST 1 VIEW COMPARISON:  05/05/2018 and earlier. FINDINGS: Costophrenic angles were excluded from the current examination. Severe thoracic dextroscoliosis and thoracolumbar levoscoliosis resulting in chest wall deformity. Cardiac silhouette upper  normal in size for AP portable technique, unchanged. Dense airspace consolidation in the lower lobes, unchanged on the RIGHT dating back to 04/21/2018 and unchanged on the LEFT dating back to 05/02/2018. No new pulmonary parenchymal abnormalities. Normal pulmonary vascularity without evidence of pulmonary edema. IMPRESSION: Stable dense bilateral lower lobe pneumonia (favored over atelectasis). No new abnormalities. Electronically Signed   By: Evangeline Dakin M.D.   On: 05/07/2018 14:41        Scheduled Meds: . bisacodyl  10 mg Oral Daily  . buPROPion  150 mg Oral BID  . chlorhexidine  15 mL Mouth Rinse BID  . Chlorhexidine Gluconate Cloth  6 each Topical Q0600  . dextromethorphan-guaiFENesin  1 tablet Oral BID  . enoxaparin (LOVENOX) injection  40 mg Subcutaneous Q24H  . ipratropium-albuterol  3 mL Nebulization TID  . mouth rinse  15 mL Mouth Rinse q12n4p  . polyethylene glycol  17 g Oral Daily  . sertraline  50 mg Oral Daily  . tamsulosin  0.4 mg Oral Daily   Continuous Infusions: . sodium chloride 10 mL/hr at 05/05/18 1747  . ceFEPime (MAXIPIME) IV Stopped (05/08/18 0413)     LOS: 5 days     Georgette Shell, MD [facility]  If 7PM-7AM, please contact night-coverage [email or web address] Password [email or web address] 05/08/2018, 10:45 AM

## 2018-05-09 ENCOUNTER — Ambulatory Visit (HOSPITAL_COMMUNITY): Admission: RE | Admit: 2018-05-09 | Payer: Medicare Other | Source: Home / Self Care | Admitting: Urology

## 2018-05-09 ENCOUNTER — Telehealth: Payer: Self-pay

## 2018-05-09 ENCOUNTER — Encounter (HOSPITAL_COMMUNITY): Admission: RE | Payer: Self-pay | Source: Home / Self Care

## 2018-05-09 DIAGNOSIS — E871 Hypo-osmolality and hyponatremia: Secondary | ICD-10-CM

## 2018-05-09 DIAGNOSIS — Z9289 Personal history of other medical treatment: Secondary | ICD-10-CM

## 2018-05-09 LAB — CULTURE, BLOOD (ROUTINE X 2)
Culture: NO GROWTH
Culture: NO GROWTH
Special Requests: ADEQUATE

## 2018-05-09 SURGERY — TURP (TRANSURETHRAL RESECTION OF PROSTATE)
Anesthesia: Spinal

## 2018-05-09 MED ORDER — ALBUTEROL SULFATE (2.5 MG/3ML) 0.083% IN NEBU: 2.5000 mg | INHALATION_SOLUTION | Freq: Four times a day (QID) | RESPIRATORY_TRACT | 0 refills | Status: AC | PRN

## 2018-05-09 MED ORDER — BISACODYL 5 MG PO TBEC: 10.0000 mg | DELAYED_RELEASE_TABLET | Freq: Every day | ORAL | 0 refills | Status: DC

## 2018-05-09 NOTE — Discharge Summary (Signed)
Physician Discharge Summary  Manuel Reeves YPP:[id number] DOB: February 10, 1933 DOA: 05/03/2018  PCP: Manuel Infante, MD  Admit date: 05/03/2018 Discharge date: 05/09/2018  Admitted From: [facility] Disposition: [place]   Recommendations for Outpatient Follow-up:  1. Follow up with PCP in 1-2 weeks 2. Follow up with pulmonology clinic. Appointment to be arranged.   Home Health: None new  Equipment/Devices: Trilogy nocturnal noninvasive ventilator Discharge Condition: Stable CODE STATUS: DNR confirmed during this admission Diet recommendation: As tolerated  Brief/Interim Summary: Manuel Reeves is an 84 y.o.malewith medical history significant ofCLL no active treatment, osteoporosis, anxiety, depression, BPH with urinary retention awaiting prostate surgery 12/30 with Dr. Karsten Reeves, and post-poliomyelitis syndrome associated with quadriparesis who was recently hospitalized for pneumonia on 12/12 who returned 12/24 with worsening weakness, and hypoxemic respiratory failure requiring BiPAP.  Patient was started on cefepime and vanc and cultures were drawn and subsequently he was successfully liberated from the BiPAP but on 12/25 he started having increase in RR and WOB and was placed back on BiPAP.  PCCM was consulted for assistance with BiPAP management and has recommended continuing trilogy at home which they have ordered and will manage as an outpatient. He has completed a 7 day course of antibiotics and has negative cultures. Weakness has improved, supplemental oxygen has been weaned, and respiratory status has stabilized.   Discharge Diagnoses:  Active Problems:   HCAP (healthcare-associated pneumonia)   Hyponatremia   Bronchitis with bronchospasm   Acute respiratory failure with hypoxemia (HCC)   Goals of care, counseling/discussion   History of ETT  Acute respiratory failure with hypoxia: Resolved. Due to pneumonia, pulmonary edema, COPD, and progressive  neuromuscular weakness dur to postpoliomyelitis syndrome with quadriparesis.  - Continued antibiotics x7 days per pulm recommendations - Continue pulmonary hygiene at home - Continue bronchodilators - Remaining euvolemic without lasix.  Acute on chronic hypercapnic respiratory failure:  - Continue pulmonology follow up as outpatient, Care Management has arranged trilogy nocturnal vent at home.   CLL: Chronic leukocytosis noted. - Follow up with Dr. Alvy Reeves.  Hematuria: Chronic, stable, improved. Nonclonal culture.  - Foley exchanged 12/27.   BPH, prostate CA: - Continue flomax - Will need to reschedule prostate surgery with Dr. Karsten Reeves.  Anxiety, depression: Continue home medications  Hyponatremia: Resolved  Hyperkalemia: Resolved  Constipation:  - Bowel regimen started  Discharge Instructions Discharge Instructions    Call MD for:  difficulty breathing, headache or visual disturbances   Complete by:  As directed    DME Nebulizer machine   Complete by:  As directed    Patient needs a nebulizer to treat with the following condition:  COPD (chronic obstructive pulmonary disease) ([place])   Discharge instructions   Complete by:  As directed    You were admitted for respiratory failure and hav eimproved. You are stable for discharge with the following recommendations:  - Continue using the TRILOGY noninvasive nocturnal ventilator. This will be provided and training given, and will be managed by pulmonology as an outpatient. An appointment will be scheduled for you there. If you don't hear from the office, please call the number for [facility] listed below.  - Continue using a nebulizer machine for albuterol as needed for wheezing or shortness of breath.  - You have completed a full course of antibiotics for pneumonia but will need to be followed closely for reworsening. Follow up with Dr. Joylene Reeves in the next week.  - Follow up with Dr. Karsten Reeves to reschedule surgery.  Increase activity slowly   Complete by:  As directed      Allergies as of 05/09/2018      Reactions   Penicillins Rash   Has patient had a PCN reaction causing immediate rash, facial/tongue/throat swelling, SOB or lightheadedness with hypotension: Y Has patient had a PCN reaction causing severe rash involving mucus membranes or skin necrosis: N Has patient had a PCN reaction that required hospitalization: N Has patient had a PCN reaction occurring within the last 10 years: N If all of the above answers are "NO", then may proceed with Cephalosporin use.         Medication List    TAKE these medications   albuterol (2.5 MG/3ML) 0.083% nebulizer solution Commonly known as:  PROVENTIL Take 3 mLs (2.5 mg total) by nebulization every 6 (six) hours as needed for wheezing or shortness of breath.   bisacodyl 5 MG EC tablet Commonly known as:  DULCOLAX Take 2 tablets (10 mg total) by mouth daily. Start taking on:  May 10, 2018   buPROPion 150 MG 12 hr tablet Commonly known as:  WELLBUTRIN SR Take 1 tablet by mouth 2 (two) times daily.   CALTRATE 600+D PO Take 1 tablet by mouth daily.   ergocalciferol 1.25 MG (50000 UT) capsule Commonly known as:  VITAMIN D2 Take 50,000 Units by mouth every 14 (fourteen) days. On Monday   LORazepam 0.5 MG tablet Commonly known as:  ATIVAN Take 0.5 mg by mouth at bedtime as needed for sleep.   THEREMS-H Tabs Take 1 tablet by mouth daily.   OCUVITE PO Take 1 tablet by mouth daily.   polyethylene glycol packet Commonly known as:  MIRALAX / GLYCOLAX Take 17 g by mouth daily as needed. What changed:  reasons to take this   sertraline 50 MG tablet Commonly known as:  ZOLOFT Take 50 mg by mouth daily.   tamsulosin 0.4 MG Caps capsule Commonly known as:  FLOMAX Take 1 capsule (0.4 mg total) by mouth daily.   zinc oxide 20 % ointment Apply 1 application topically as needed for irritation. To buttocks after every incontinent episode and  as needed for redness. May keep at bedside.            Durable Medical Equipment  (From admission, onward)         Start     Ordered   05/09/18 1002  For home use only DME Ventilator  Once     05/09/18 1001   05/09/18 0000  DME Nebulizer machine    Question:  Patient needs a nebulizer to treat with the following condition  Answer:  COPD (chronic obstructive pulmonary disease) ([place])   05/09/18 1016         Follow-up Information    Manuel Infante, MD Follow up.   Specialty:  Internal Medicine Contact information: [place] [address] [phone]        [name], NP Follow up.   Specialties:  Nurse Practitioner, Acute Care Contact information: [place] [address] [phone]          Allergies  Allergen Reactions  . Penicillins Rash    Has patient had a PCN reaction causing immediate rash, facial/tongue/throat swelling, SOB or lightheadedness with hypotension: Y Has patient had a PCN reaction causing severe rash involving mucus membranes or skin necrosis: N Has patient had a PCN reaction that required hospitalization: N Has patient had a PCN reaction occurring within the last 10  years: N If all of the above answers are "NO", then may proceed with Cephalosporin use.       Consultations:  PCCM  Procedures/Studies: Dg Chest 1 View  Result Date: 05/07/2018 CLINICAL DATA:  Progressively with worsening shortness of breath associated with cough over the past several days. Recent hospitalization for pneumonia, discharged on 04/21/2018. Personal history of poliomyelitis. EXAM: Portable CHEST 1 VIEW COMPARISON:  05/05/2018 and earlier. FINDINGS: Costophrenic angles were excluded from the current examination. Severe thoracic dextroscoliosis and thoracolumbar levoscoliosis resulting in chest wall deformity. Cardiac silhouette upper normal in size for AP portable technique, unchanged. Dense airspace consolidation in the lower  lobes, unchanged on the RIGHT dating back to 04/21/2018 and unchanged on the LEFT dating back to 05/02/2018. No new pulmonary parenchymal abnormalities. Normal pulmonary vascularity without evidence of pulmonary edema. IMPRESSION: Stable dense bilateral lower lobe pneumonia (favored over atelectasis). No new abnormalities. Electronically Signed   By: Evangeline Dakin M.D.   On: 05/07/2018 14:41   Dg Chest 1 View  Result Date: 05/04/2018 CLINICAL DATA:  Cough. EXAM: CHEST  1 VIEW COMPARISON:  05/03/2018. FINDINGS: 0512 hours. Marked thoracolumbar scoliosis distorts cardio thoracic anatomy. Interstitial markings are diffusely coarsened with chronic features. Similar appearance of hazy opacity over the lung bases, indeterminate. Bones are diffusely demineralized. IMPRESSION: No appreciable interval change in exam. Electronically Signed   By: Misty Stanley M.D.   On: 05/04/2018 07:52   Dg Chest 2 View  Result Date: 05/03/2018 CLINICAL DATA:  Preop.  Recent pneumonia.  Cough. EXAM: CHEST - 2 VIEW COMPARISON:  04/21/2018 FINDINGS: Severe thoracolumbar scoliosis and deformity of the ribcage, limiting study. Very difficult to evaluate lung bases. There appear to be increased densities at both lung bases, similar prior study, but this may be related to thoracic deformity rather than true infiltrate or effusion. IMPRESSION: Limited study due to severe thoracolumbar scoliosis and thoracic cage deformity. Persistent apparent increased densities at both lung bases may be related to thoracic deformity rather than true abnormality. Electronically Signed   By: Rolm Baptise M.D.   On: 05/03/2018 08:19   Dg Chest 2 View  Result Date: 04/21/2018 CLINICAL DATA:  84 year old male with productive cough for 1 week. EXAM: CHEST - 2 VIEW COMPARISON:  Chest radiograph dated 11/17/2013 FINDINGS: Evaluation is limited due to severe thoracic dextroscoliosis. There is an area of opacity in the right lower lobe likely representing  a small right pleural effusion and associated atelectasis/infiltrate. An area of opacity at the left apex appears similar to prior radiograph. There is no pneumothorax. Stable cardiac silhouette. Degenerative changes of the spine and scoliosis. No acute osseous pathology. IMPRESSION: Findings likely represent a small right pleural effusion and right lower lobe pneumonia. Clinical correlation and follow-up is recommended. Electronically Signed   By: Anner Crete M.D.   On: 04/21/2018 22:51   Dg Chest Port 1 View  Result Date: 05/05/2018 CLINICAL DATA:  Endotracheal tube placement EXAM: PORTABLE CHEST 1 VIEW COMPARISON:  05/04/2018 FINDINGS: Endotracheal tube not present. Marked scoliosis and chest deformity. Bibasilar atelectasis/infiltrate unchanged with small right effusion. IMPRESSION: No significant change bibasilar atelectasis/infiltrate. Electronically Signed   By: Franchot Gallo M.D.   On: 05/05/2018 07:50   Dg Chest Port 1 View  Result Date: 05/03/2018 CLINICAL DATA:  Shortness of breath. EXAM: PORTABLE CHEST 1 VIEW COMPARISON:  05/02/2018. FINDINGS: The cardio pericardial silhouette is enlarged. Marked thoracolumbar scoliosis distorts cardio thoracic anatomy. No evidence for pulmonary edema. Haziness overlying the lung bases is stable  and may be related to chest morphology. Atelectasis or infiltrate at the base or small effusion can not be excluded. Bones are diffusely demineralized. Telemetry leads overlie the chest. IMPRESSION: Stable exam. Marked thoracolumbar scoliosis distorts cardio thoracic anatomy limiting assessment of the lung bases. Electronically Signed   By: Misty Stanley M.D.   On: 05/03/2018 14:13    Echocardiogram: - Procedure narrative: Image quality was suboptimal. The study was   technically difficult, as a result of poor acoustic windows, poor   sound wave transmission, chest wall deformity, and body habitus. - Left ventricle: The cavity size was normal. Systolic  function was   vigorous. The estimated ejection fraction was in the range of 65%   to 70%. Wall motion was normal; there were no regional wall   motion abnormalities. Doppler parameters are consistent with   abnormal left ventricular relaxation (grade 1 diastolic   dysfunction). - Aortic valve: Transvalvular velocity was within the normal range.   There was no stenosis. There was no regurgitation. - Mitral valve: There was no regurgitation. - Right ventricle: The cavity size was mildly dilated. Wall   thickness was normal. Systolic function was normal. - Tricuspid valve: There was trivial regurgitation. - Pulmonary arteries: Systolic pressure could not be accurately   estimated. - Inferior vena cava: Not visualized. - Pericardium, extracardiac: There was no pericardial effusion.  Subjective: Feels well, not on oxygen for days. No fevers, cough is stable. Wants to go home. Does not want PT, as this "almost killed me before."   Discharge Exam: Vitals:   05/09/18 0359 05/09/18 0800  BP:  (!) 167/75  Pulse:  87  Resp:  19  Temp: 97.6 F (36.4 C) 97.7 F (36.5 C)  SpO2:  100%   General: Pt is alert, awake, not in acute distress Cardiovascular: RRR, S1/S2 +, no rubs, no gallops Respiratory: Nonlabored, decreased with coarse sounds at bases. No wheezes or inspiratory crackles. Abdominal: Soft, NT, ND, bowel sounds + Neuro: Flaccid paraplegia and paresis above waist. Alert and oriented.  Labs: BNP (last 3 results) Recent Labs    04/21/18 2243 05/03/18 1347 05/04/18 1207  BNP 25.9 83.9 77.8   Basic Metabolic Panel: Recent Labs  Lab 05/02/18 1608 05/03/18 1347 05/04/18 0314 05/05/18 0320 05/08/18 0337  NA 133* 130* 133* 136 135  K 5.0 5.1 5.2* 3.8 3.7  CL 94* 91* 96* 98 97*  CO2 31 30 24 28 31   GLUCOSE 113* 94 124* 111* 108*  BUN 8 9 11 16 17   CREATININE 0.50* 0.40* 0.51* 0.58* 0.52*  CALCIUM 9.1 8.7* 8.4* 8.2* 8.7*  MG  --   --   --  1.8  --   PHOS  --   --   --   2.8  --    Liver Function Tests: Recent Labs  Lab 05/04/18 0314  AST 15  ALT 15  ALKPHOS 101  BILITOT 1.0  PROT 5.7*  ALBUMIN 3.2*   No results for input(s): LIPASE, AMYLASE in the last 168 hours. No results for input(s): AMMONIA in the last 168 hours. CBC: Recent Labs  Lab 05/02/18 1608 05/03/18 1347 05/04/18 0314 05/05/18 0320 05/08/18 0337  WBC 19.1* 20.7* 18.5* 19.4* 15.6*  HGB 15.2 14.6 14.3 13.2 11.7*  HCT 46.6 45.2 45.9 42.4 37.7*  MCV 99.8 98.5 102.7* 102.4* 101.6*  PLT 327 318 311 250 252   Cardiac Enzymes: Recent Labs  Lab 05/03/18 1347  TROPONINI 0.03*   BNP: Invalid input(s): POCBNP CBG: No  results for input(s): GLUCAP in the last 168 hours. D-Dimer No results for input(s): DDIMER in the last 72 hours. Hgb A1c No results for input(s): HGBA1C in the last 72 hours. Lipid Profile No results for input(s): CHOL, HDL, LDLCALC, TRIG, CHOLHDL, LDLDIRECT in the last 72 hours. Thyroid function studies No results for input(s): TSH, T4TOTAL, T3FREE, THYROIDAB in the last 72 hours.  Invalid input(s): FREET3 Anemia work up No results for input(s): VITAMINB12, FOLATE, FERRITIN, TIBC, IRON, RETICCTPCT in the last 72 hours. Urinalysis    Component Value Date/Time   COLORURINE AMBER (A) 05/03/2018 1800   APPEARANCEUR CLOUDY (A) 05/03/2018 1800   LABSPEC 1.020 05/03/2018 1800   PHURINE 5.0 05/03/2018 1800   GLUCOSEU NEGATIVE 05/03/2018 1800   HGBUR LARGE (A) 05/03/2018 1800   BILIRUBINUR NEGATIVE 05/03/2018 1800   KETONESUR 80 (A) 05/03/2018 1800   PROTEINUR 100 (A) 05/03/2018 1800   NITRITE NEGATIVE 05/03/2018 1800   LEUKOCYTESUR MODERATE (A) 05/03/2018 1800    Microbiology Recent Results (from the past 240 hour(s))  Culture, Urine     Status: None   Collection Time: 05/03/18  5:33 PM  Result Value Ref Range Status   Specimen Description   Final    URINE, RANDOM Performed at [facility], [place] [address], [place], [place]  [address]    Special Requests   Final    NONE Performed at [facility], [place] [address], [place], [place] [address]    Culture   Final    Multiple bacterial morphotypes present, none predominant. Suggest appropriate recollection if clinically indicated.   Report Status 05/05/2018 FINAL  Final  MRSA PCR Screening     Status: Abnormal   Collection Time: 05/03/18  5:33 PM  Result Value Ref Range Status   MRSA by PCR POSITIVE (A) NEGATIVE Final    Comment:        The GeneXpert MRSA Assay (FDA approved for NASAL specimens only), is one component of a comprehensive MRSA colonization surveillance program. It is not intended to diagnose MRSA infection nor to guide or monitor treatment for MRSA infections. RESULT CALLED TO, READ BACK BY AND VERIFIED WITH: S.LANEY AT 2026 ON 05/03/18 BY N.THOMPSON Performed at [facility], [place] [address] [place], [place] [address]   Respiratory Panel by PCR     Status: None   Collection Time: 05/03/18  7:09 PM  Result Value Ref Range Status   Adenovirus NOT DETECTED NOT DETECTED Final   Coronavirus 229E NOT DETECTED NOT DETECTED Final   Coronavirus HKU1 NOT DETECTED NOT DETECTED Final   Coronavirus NL63 NOT DETECTED NOT DETECTED Final   Coronavirus OC43 NOT DETECTED NOT DETECTED Final   Metapneumovirus NOT DETECTED NOT DETECTED Final   Rhinovirus / Enterovirus NOT DETECTED NOT DETECTED Final   Influenza A NOT DETECTED NOT DETECTED Final   Influenza B NOT DETECTED NOT DETECTED Final   Parainfluenza Virus 1 NOT DETECTED NOT DETECTED Final   Parainfluenza Virus 2 NOT DETECTED NOT DETECTED Final   Parainfluenza Virus 3 NOT DETECTED NOT DETECTED Final   Parainfluenza Virus 4 NOT DETECTED NOT DETECTED Final   Respiratory Syncytial Virus NOT DETECTED NOT DETECTED Final   Bordetella pertussis NOT DETECTED NOT DETECTED Final   Chlamydophila pneumoniae NOT DETECTED NOT DETECTED Final   Mycoplasma pneumoniae NOT DETECTED  NOT DETECTED Final    Comment: Performed at [facility], [place] [address] [place], [place] [address]  Culture, blood (Routine X 2) w Reflex  to ID Panel     Status: None (Preliminary result)   Collection Time: 05/04/18 11:54 AM  Result Value Ref Range Status   Specimen Description   Final    BLOOD RIGHT ANTECUBITAL Performed at [place] [address], [place], [place] [address]    Special Requests   Final    BOTTLES DRAWN AEROBIC ONLY Blood Culture results may not be optimal due to an inadequate volume of blood received in culture bottles Performed at [place] [address] [place], [place] [address]    Culture   Final    NO GROWTH 4 DAYS Performed at [facility], [place] [address], [place], [place] [address]    Report Status PENDING  Incomplete  Culture, blood (Routine X 2) w Reflex to ID Panel     Status: None (Preliminary result)   Collection Time: 05/04/18 12:07 PM  Result Value Ref Range Status   Specimen Description   Final    BLOOD RIGHT HAND Performed at [place] [address], [place], [place] [address]    Special Requests   Final    BOTTLES DRAWN AEROBIC ONLY Blood Culture adequate volume Performed at [place] [address] [place], [place] [address]    Culture   Final    NO GROWTH 4 DAYS Performed at [facility], [place] [address], [place], [place] [address]    Report Status PENDING  Incomplete    Time coordinating discharge: Approximately 40 minutes  Patrecia Pour, MD  [facility] 05/09/2018, 10:16 AM Pager [phone]

## 2018-05-09 NOTE — Telephone Encounter (Signed)
Attempted to call patient today regarding pt in need to schedule Hospital f/u with NP in next 2 weeks from today's date. I did not receive an answer at time of call. I have left a voicemail message for pt to return call. X1

## 2018-05-09 NOTE — Progress Notes (Signed)
Pt on RA at this time. Pt doing well no distress noted, vitals stable.

## 2018-05-09 NOTE — Telephone Encounter (Signed)
-----   Message from [name], NP sent at 05/09/2018  9:31 AM EST ----- Regarding: needs follow up Hey guys  This patient needs NP follow up in next two weeks then will need to be set up with a Doctor. His primary Crist Infante would like [place] to follow for chronic respiratory failure   Please contact the family when you have made an appointment and shoot me a message to confirm this was done.   Thanks and happy newyear! Laurey Arrow

## 2018-05-09 NOTE — Care Management Note (Addendum)
Case Management Note  Patient Details  Name: Manuel Reeves MRN: [id number] Date of Birth: 1932/12/07  Subjective/Objective:                  Note buried in other physician note concerning triology Disposition: Meeting with family later today will formally fill out DNR orders.  Placing order with case management for trilogy ventilator.  I think he benefit from nocturnal ventilation. This patient requires non-invasive ventilator for acute on chronic respiratory failure and COPD. Without ventilator, there is significant risk of untimely readmission to the hospital and increase on CO2 retention. BIPAP is ineffective in the home setting in reducing CO2 due to the lack of an auto-rate.    [name] ACNP-BC [place] Pager # [phone] OR # [phone] if no answer  Action/Plan: Order rec'd [id number] [email or web address] for order form and note requirementy. Completed order form and information faxed to [phone] attent: [name]. [name]-trilology vent can not be obtained until am of 05/10/2018/icu notified. Expected Discharge Date:                  Expected Discharge Plan:  Skilled Nursing Facility  In-House Referral:  Clinical Social Work  Discharge planning Services  CM Consult  Post Acute Care Choice:    Choice offered to:     DME Arranged:    DME Agency:     HH Arranged:    [place] Agency:     Status of Service:  In process, will continue to follow  If discussed at Long Length of Stay Meetings, dates discussed:    Additional Comments:  [name], RN 05/09/2018, 10:09 AM

## 2018-05-09 NOTE — Progress Notes (Signed)
No formal consult.   Patient is from independent living at [facility]. Patient will return home at dc with home vent.   LCSW signing off. No CSW needs.   Carolin Coy Hawi Long Crowder

## 2018-05-10 NOTE — Progress Notes (Signed)
PROGRESS NOTE Subjective: Patient without new complaints, had an uneventful night using BiPAP. May be a bit delirious this morning. He is eager to return home and is very preoccupied with who will pay for the trilogy vent.   Objective: BP (!) 142/74 (BP Location: Left Arm)   Pulse 80   Temp 97.7 F (36.5 C) (Oral)   Resp 10   Ht 5\' 11"  (1.803 m)   Wt 82.6 kg   SpO2 98%   BMI 25.40 kg/m   Gen: Elderly male in no distress Pulm: Clear and nonlabored  CV: RRR, no murmur, no JVD, 1+ LE edema GI: Soft, NT, ND, +BS  Neuro: Alert and oriented. Paraplegia, flaccid. Upper extremity paresis stable.  Assessment & Plan: Patient with postpolio syndrome increasingly affecting muscles of respiration, has stabilized with treatment for pneumonia, on room air. He's completed antibiotics and remains stable for discharge. It appears delay from DC yesterday was obtaining vent this morning. See DC summary from 12/30 for details on plans for management and follow up.  Patrecia Pour, MD 05/10/2018, 9:13 AM

## 2018-05-10 NOTE — Progress Notes (Signed)
Removed PT from BiPAP per PT request.

## 2018-05-10 NOTE — Care Management Note (Signed)
Case Management Note  Patient Details  Name: Manuel Reeves MRN: [id number] Date of Birth: 1932-10-09  Subjective/Objective:                  Discharge planning  Action/Plan: Has triliogy vent for home will return to [facility] independent living  Expected Discharge Date:  05/09/18               Expected Discharge Plan:  Home/Self Care  In-House Referral:  Clinical Social Work  Discharge planning Services  CM Consult  Post Acute Care Choice:  Durable Medical Equipment Choice offered to:     DME Arranged:  Ventilator DME Agency:  Other - Comment  HH Arranged:    HH Agency:     Status of Service:  Completed, signed off  If discussed at [place] of Stay Meetings, dates discussed:    Additional Comments:  [name], RN 05/10/2018, 12:19 PM

## 2018-05-12 DIAGNOSIS — L89152 Pressure ulcer of sacral region, stage 2: Secondary | ICD-10-CM | POA: Diagnosis not present

## 2018-05-12 DIAGNOSIS — J189 Pneumonia, unspecified organism: Secondary | ICD-10-CM | POA: Diagnosis not present

## 2018-05-12 DIAGNOSIS — J9612 Chronic respiratory failure with hypercapnia: Secondary | ICD-10-CM | POA: Diagnosis not present

## 2018-05-12 DIAGNOSIS — B372 Candidiasis of skin and nail: Secondary | ICD-10-CM | POA: Diagnosis not present

## 2018-05-13 NOTE — Telephone Encounter (Signed)
Spoke with pt's wife. She is aware that the pt needs a HFU. This has been scheduled for 05/23/2018 at 10:00am. Nothing further was needed.

## 2018-05-13 NOTE — Telephone Encounter (Signed)
Pt is calling back [phone]

## 2018-05-16 DIAGNOSIS — Z8701 Personal history of pneumonia (recurrent): Secondary | ICD-10-CM | POA: Diagnosis not present

## 2018-05-16 DIAGNOSIS — R338 Other retention of urine: Secondary | ICD-10-CM | POA: Diagnosis not present

## 2018-05-16 DIAGNOSIS — L304 Erythema intertrigo: Secondary | ICD-10-CM | POA: Diagnosis not present

## 2018-05-16 DIAGNOSIS — B3749 Other urogenital candidiasis: Secondary | ICD-10-CM | POA: Diagnosis not present

## 2018-05-16 DIAGNOSIS — L89152 Pressure ulcer of sacral region, stage 2: Secondary | ICD-10-CM | POA: Diagnosis not present

## 2018-05-22 NOTE — Progress Notes (Signed)
@Patient  ID: Manuel Reeves, male    DOB: 1932/07/07, 85 y.o.   MRN: [id number]  Chief Complaint  Patient presents with  . 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 Significant Hospital Events   12/24 admission with BiPAP for HCAP on the RLL 12/25 PCCM consult for BiPAP management 12/26: Improved, DNR status discussed, MOST form filled out 12/27 feels better  Plan: Trilogy ventilator at home, DNR status, follow-up with our office and be established with a pulmonologist  05/23/2018  - Visit   85 year old male former smoker (quit age 45) initially consulted with our office on 05/04/2018 when inpatient for acute on chronic respiratory failure.  Patient was discharged home on a trilogy ventilator which she has been adherent to using but has not been using for the entire time when he sleeps.  Per patient as well as spouse and son who are in the room patient averages about 4 hours of trilogy vent use at night as well as 1 or 2 hours throughout the day as he can.  Spouse and son report that he continues to struggle with adherence to using the trilogy ventilator and they are wondering if ventilator settings could be changed.  Patient also reports that he has aspects of anxiety and claustrophobia when using the  mask.  There is a known mask leak per spouse which she is tried to adjust.  They have not contacted [facility] regarding any of these issues or our office.  Spouse and son both have concerns that the patient has times where he has increased confusion in the afternoons which is similar to when he was hypercarbic and admitted to the hospital.  Since being discharged from the hospital patient feels that his respiratory symptoms have slowly started to improve.  He does have an occasional cough with clear productive mucus.  But overall feels that fatigue is improving.  Unfortunately over the last 2 weeks the patient has developed severe right hip pain.  Patient, spouse, son are unable to explain because of this right hip pain but is limiting the patient's mobility.  Patient does use a [employer] fast streamer at home.  Patient is in a motorized wheelchair today.  They are requesting if we could get imaging on this.  Unfortunately the patient did present to his PCP for hospital follow-up but did not discuss the symptoms with his PCP at this time.  Patient also has a chronic indwelling Foley catheter which is managed by Dr. Karsten Ro.  This was just switched last week.  Patient is awaiting prostate surgery.   Tests:   05/07/2018-chest x-ray-stable dense bilateral lower lobe pneumonia favored atelectasis no new abnormalities  05/04/2018-chest x-ray- right lower lobe infiltrate but difficult to interpret due to patient's body habitus and scoliosis  05/06/2018-echocardiogram-LV ejection fraction 65 to 37%, grade 1 diastolic dysfunction  34/28/7681-[id number] gas arterial-pH 7.34, PCO2 57.2, PO2 70.1, bicarb  30.3  05/03/2018-respiratory virus panel and influenza panel-negative  FENO:  No results found for: NITRICOXIDE  PFT: No flowsheet data found.  Imaging: Dg Chest 1 View  Result Date: 05/07/2018 CLINICAL DATA:  Progressively with worsening shortness of breath associated with cough over the past several days.  Recent hospitalization for pneumonia, discharged on 04/21/2018. Personal history of poliomyelitis. EXAM: Portable CHEST 1 VIEW COMPARISON:  05/05/2018 and earlier. FINDINGS: Costophrenic angles were excluded from the current examination. Severe thoracic dextroscoliosis and thoracolumbar levoscoliosis resulting in chest wall deformity. Cardiac silhouette upper normal in size for AP portable technique, unchanged. Dense airspace consolidation in the lower lobes, unchanged on the RIGHT dating back to 04/21/2018 and unchanged on the LEFT dating back to 05/02/2018. No new pulmonary parenchymal abnormalities. Normal pulmonary vascularity without evidence of pulmonary edema. IMPRESSION: Stable dense bilateral lower lobe pneumonia (favored over atelectasis). No new abnormalities. Electronically Signed   By: Evangeline Dakin M.D.   On: 05/07/2018 14:41   Dg Chest 1 View  Result Date: 05/04/2018 CLINICAL DATA:  Cough. EXAM: CHEST  1 VIEW COMPARISON:  05/03/2018. FINDINGS: 0512 hours. Marked thoracolumbar scoliosis distorts cardio thoracic anatomy. Interstitial markings are diffusely coarsened with chronic features. Similar appearance of hazy opacity over the lung bases, indeterminate. Bones are diffusely demineralized. IMPRESSION: No appreciable interval change in exam. Electronically Signed   By: Misty Stanley M.D.   On: 05/04/2018 07:52   Dg Chest 2 View  Result Date: 05/03/2018 CLINICAL DATA:  Preop.  Recent pneumonia.  Cough. EXAM: CHEST - 2 VIEW COMPARISON:  04/21/2018 FINDINGS: Severe thoracolumbar scoliosis and deformity of the ribcage, limiting study. Very difficult to evaluate lung bases. There appear to be increased densities at both lung bases, similar prior study, but this may be related to thoracic deformity rather than true infiltrate or effusion. IMPRESSION: Limited study due to severe thoracolumbar scoliosis and thoracic cage deformity. Persistent apparent increased densities at both lung bases may be  related to thoracic deformity rather than true abnormality. Electronically Signed   By: Rolm Baptise M.D.   On: 05/03/2018 08:19   Dg Chest Port 1 View  Result Date: 05/05/2018 CLINICAL DATA:  Endotracheal tube placement EXAM: PORTABLE CHEST 1 VIEW COMPARISON:  05/04/2018 FINDINGS: Endotracheal tube not present. Marked scoliosis and chest deformity. Bibasilar atelectasis/infiltrate unchanged with small right effusion. IMPRESSION: No significant change bibasilar atelectasis/infiltrate. Electronically Signed   By: Franchot Gallo M.D.   On: 05/05/2018 07:50   Dg Chest Port 1 View  Result Date: 05/03/2018 CLINICAL DATA:  Shortness of breath. EXAM: PORTABLE CHEST 1 VIEW COMPARISON:  05/02/2018. FINDINGS: The cardio pericardial silhouette is enlarged. Marked thoracolumbar scoliosis distorts cardio thoracic anatomy. No evidence for pulmonary edema. Haziness overlying the lung bases is stable and may be related to chest morphology. Atelectasis or infiltrate at the base or small effusion can not be excluded. Bones are diffusely demineralized. Telemetry leads overlie the chest. IMPRESSION: Stable exam. Marked thoracolumbar scoliosis distorts cardio thoracic anatomy limiting assessment of the lung bases. Electronically Signed   By: Misty Stanley M.D.   On: 05/03/2018 14:13      Specialty Problems      Pulmonary Problems   Aspiration pneumonia (HCC)   Cough   HCAP (healthcare-associated pneumonia)   Bronchitis with bronchospasm   Acute respiratory failure with hypoxemia (HCC)   Acute and chronic respiratory failure with hypercapnia (HCC)      Allergies  Allergen Reactions  . Penicillins Rash    Has patient had a PCN  reaction causing immediate rash, facial/tongue/throat swelling, SOB or lightheadedness with hypotension: Y Has patient had a PCN reaction causing severe rash involving mucus membranes or skin necrosis: N Has patient had a PCN reaction that required hospitalization: N Has patient had  a PCN reaction occurring within the last 10 years: N If all of the above answers are "NO", then may proceed with Cephalosporin use.       Immunization History  Administered Date(s) Administered  . Influenza, High Dose Seasonal PF 01/31/2018  . Influenza-Unspecified 01/14/2016, 02/10/2017  . Tdap 10/30/2011    Past Medical History:  Diagnosis Date  . Anxiety   . CLL (chronic lymphocytic leukemia) ([place]) 02/20/2014  . Osteoporosis   . Pneumonia   . Polio     Tobacco History: Social History   Tobacco Use  Smoking Status Former Smoker  Smokeless Tobacco Never Used  Tobacco Comment   Quit at age 35   Counseling given: Yes Comment: Quit at age 51   Outpatient Encounter Medications as of 05/23/2018  Medication Sig  . albuterol (PROVENTIL) (2.5 MG/3ML) 0.083% nebulizer solution Take 3 mLs (2.5 mg total) by nebulization every 6 (six) hours as needed for wheezing or shortness of breath.  . bisacodyl (DULCOLAX) 5 MG EC tablet Take 2 tablets (10 mg total) by mouth daily.  Marland Kitchen buPROPion (WELLBUTRIN SR) 150 MG 12 hr tablet Take 1 tablet by mouth 2 (two) times daily.   . Calcium Carbonate-Vitamin D (CALTRATE 600+D PO) Take 1 tablet by mouth daily.  . ergocalciferol (VITAMIN D2) 50000 units capsule Take 50,000 Units by mouth every 14 (fourteen) days. On Monday  . LORazepam (ATIVAN) 0.5 MG tablet Take 0.5 mg by mouth at bedtime as needed for sleep.  . Multiple Vitamins-Minerals (OCUVITE PO) Take 1 tablet by mouth daily.  . Multiple Vitamins-Minerals (THEREMS-H) TABS Take 1 tablet by mouth daily.  . polyethylene glycol (MIRALAX / GLYCOLAX) packet Take 17 g by mouth daily as needed. (Patient taking differently: Take 17 g by mouth daily as needed for mild constipation. )  . sertraline (ZOLOFT) 50 MG tablet Take 50 mg by mouth daily.  . tamsulosin (FLOMAX) 0.4 MG CAPS capsule Take 1 capsule (0.4 mg total) by mouth daily.  Marland Kitchen zinc oxide 20 % ointment Apply 1 application topically as needed for  irritation. To buttocks after every incontinent episode and as needed for redness. May keep at bedside.   No facility-administered encounter medications on file as of 05/23/2018.      Review of Systems  Review of Systems  Constitutional: Positive for fatigue (improving ). Negative for activity change, appetite change and fever.  HENT: Positive for congestion.   Respiratory: Positive for cough (clear mucous ). Negative for shortness of breath and wheezing.   Cardiovascular: Negative for chest pain and palpitations.  Gastrointestinal: Negative for diarrhea, nausea and vomiting.  Genitourinary:       Chronic foley - dr Consuella Lose - changed last week   +resolving yeast infection  Musculoskeletal: Positive for arthralgias.       +right Hip pain - worsened over last 2 weeks   Psychiatric/Behavioral: Positive for confusion (Occasionally in the afternoons).     Physical Exam  BP (!) 128/58 (BP Location: Left Arm, Cuff Size: Normal)   Pulse 95   SpO2 98%   Wt Readings from Last 5 Encounters:  05/03/18 182 lb 1.6 oz (82.6 kg)  04/22/18 182 lb 1.6 oz (82.6 kg)  04/11/18 185 lb 3.2 oz (84 kg)  03/31/18 185  lb 3.2 oz (84 kg)  03/02/17 200 lb (90.7 kg)   >>> Unable to weigh in and get height patient today patient is unable to stand on motorized wheelchair due to right hip pain  Physical Exam  Constitutional: He is oriented to person, place, and time. Vital signs are normal. He appears cachectic. No distress.  +85 year old frail elderly male  HENT:  Head: Normocephalic and atraumatic.  Right Ear: Hearing, tympanic membrane, external ear and ear canal normal.  Left Ear: Hearing, tympanic membrane, external ear and ear canal normal.  Nose: Nose normal. Right sinus exhibits no maxillary sinus tenderness and no frontal sinus tenderness. Left sinus exhibits no maxillary sinus tenderness and no frontal sinus tenderness.  Mouth/Throat: Uvula is midline and oropharynx is clear and moist. No  oropharyngeal exudate.  Eyes: Pupils are equal, round, and reactive to light.  Neck: Normal range of motion. Neck supple.  Cardiovascular: Normal rate, regular rhythm and normal heart sounds.  Pulmonary/Chest: Effort normal and breath sounds normal. No accessory muscle usage. No respiratory distress. He has no decreased breath sounds. He has no wheezes. He has no rhonchi. He has no rales.  + Diminished breath sounds + Limited exam due to patient's inability to move in motorized wheelchair  Genitourinary:    Genitourinary Comments: Chronic Foley just changed last week with Dr. Ma Rings Resolving yeast infection   Musculoskeletal:        General: Tenderness (Right hip pain) present. No edema (Lower extremity compression stockings applied bilaterally).     Comments: In motorized wheelchair for assessment today  Lymphadenopathy:    He has no cervical adenopathy.  Neurological: He is alert and oriented to person, place, and time. Gait normal.  Skin: Skin is warm and dry. He is not diaphoretic. No erythema.  Psychiatric: Mood, memory, affect and judgment normal.  Nursing note and vitals reviewed.     Lab Results:  CBC    Component Value Date/Time   WBC 15.6 (H) 05/08/2018 0337   RBC 3.71 (L) 05/08/2018 0337   HGB 11.7 (L) 05/08/2018 0337   HGB 15.3 01/29/2015 1315   HCT 37.7 (L) 05/08/2018 0337   HCT 46.3 01/29/2015 1315   PLT 252 05/08/2018 0337   PLT 207 01/29/2015 1315   MCV 101.6 (H) 05/08/2018 0337   MCV 96.8 01/29/2015 1315   MCH 31.5 05/08/2018 0337   MCHC 31.0 05/08/2018 0337   RDW 13.9 05/08/2018 0337   RDW 14.5 01/29/2015 1315   LYMPHSABS 9.7 (H) 04/22/2018 0140   LYMPHSABS 7.9 (H) 01/29/2015 1315   MONOABS 0.6 04/22/2018 0140   MONOABS 0.3 01/29/2015 1315   EOSABS 0.3 04/22/2018 0140   EOSABS 0.1 01/29/2015 1315   BASOSABS 0.1 04/22/2018 0140   BASOSABS 0.0 01/29/2015 1315    BMET    Component Value Date/Time   NA 135 05/08/2018 0337   NA 142 04/05/2018    NA 142 01/29/2015 1316   K 3.7 05/08/2018 0337   K 4.6 01/29/2015 1316   CL 97 (L) 05/08/2018 0337   CO2 31 05/08/2018 0337   CO2 31 (H) 01/29/2015 1316   GLUCOSE 108 (H) 05/08/2018 0337   GLUCOSE 92 01/29/2015 1316   BUN 17 05/08/2018 0337   BUN 12 04/05/2018   BUN 16.6 01/29/2015 1316   CREATININE 0.52 (L) 05/08/2018 0337   CREATININE 0.7 01/29/2015 1316   CALCIUM 8.7 (L) 05/08/2018 0337   CALCIUM 9.4 01/29/2015 1316   GFRNONAA >60 05/08/2018 0337   GFRAA >60 05/08/2018  0337    BNP    Component Value Date/Time   BNP 64.1 05/04/2018 1207    ProBNP No results found for: PROBNP    Assessment & Plan:    HCAP (healthcare-associated pneumonia) Assessment: Appears stable today Patient reporting clinical improvement No audible wheezing or rales on assessment today although this was limited due to patient's body habitus and ability to move in motorized wheelchair  Plan: Chest x-ray today  Acute and chronic respiratory failure with hypercapnia (HCC) Assessment: Recent hospitalization for hypercarbia Patient was maintained on BiPAP in the hospital and discharged on trilogy home vent Patient has been averaging about 4 to 5 hours of trilogy home vent use as well as 1 to 2 hours during the day Patient having difficulty with current settings Family reporting the patient has occasional confusion in the afternoons when they feel like he is retaining more CO2 Post polio -probable restriction, no PFTs  Plan: ABG today Continue trilogy vent [facility]ed [facility] for [facility] vent compliance and settings Patient to follow-up with Dr. Ander Slade at his first available appointment Chest x-ray today   Right hip pain Assessment: Patient with past medical history of osteoporosis previously managed on Fosamax but this was stopped 3 years ago Patient denies any known trauma or falls that could have caused right hip pain Worsened right hip pain over the last 2  weeks which limits patient's mobility and ability to stand today  Plan: Hip x-ray today Schedule appointment with primary care for right hip pain follow-up  Discussed with patient, spouse, son that we will not be managing findings on this hip x-ray that will need to be managed by primary care.  They need to schedule an appointment with primary care for future follow-up.  Due to patient's inability to mobilize well we will try to get x-ray coordinated today as he is also getting a chest x-ray.     Lauraine Rinne, NP 05/23/2018   This appointment was 42 min long with over 50% of the time in direct face-to-face patient care, assessment, plan of care, and follow-up.

## 2018-05-23 ENCOUNTER — Encounter: Payer: Self-pay | Admitting: Pulmonary Disease

## 2018-05-23 ENCOUNTER — Ambulatory Visit (HOSPITAL_COMMUNITY)
Admission: RE | Admit: 2018-05-23 | Discharge: 2018-05-23 | Disposition: A | Discharge status: Home / Self Care | Payer: Medicare Other | Source: Ambulatory Visit | Attending: Pulmonary Disease | Admitting: Pulmonary Disease

## 2018-05-23 ENCOUNTER — Ambulatory Visit (INDEPENDENT_AMBULATORY_CARE_PROVIDER_SITE_OTHER): Payer: Medicare Other | Admitting: Pulmonary Disease

## 2018-05-23 VITALS — BP 128/58 | HR 95

## 2018-05-23 DIAGNOSIS — R062 Wheezing: Secondary | ICD-10-CM | POA: Diagnosis not present

## 2018-05-23 DIAGNOSIS — R05 Cough: Secondary | ICD-10-CM | POA: Diagnosis not present

## 2018-05-23 DIAGNOSIS — M25551 Pain in right hip: Secondary | ICD-10-CM | POA: Insufficient documentation

## 2018-05-23 DIAGNOSIS — J9622 Acute and chronic respiratory failure with hypercapnia: Secondary | ICD-10-CM

## 2018-05-23 DIAGNOSIS — J189 Pneumonia, unspecified organism: Secondary | ICD-10-CM

## 2018-05-23 DIAGNOSIS — M1611 Unilateral primary osteoarthritis, right hip: Secondary | ICD-10-CM | POA: Diagnosis not present

## 2018-05-23 NOTE — Patient Instructions (Addendum)
ABG today  >>> Patient care with call with you and will schedule today   Xray today  >>>Right hip  >>>Chest xray   Please continue to use your trilogy ventilator at home  Please follow-up with our office with Dr. Ander Slade test first available >>>This needs to be a 30-minute office visit as he is a new patient to Dr. Ander Slade  Schedule follow-up with your primary care Dr. Crist Infante regarding your increasing right hip pain  Continue follow-up with urology regarding chronic Foley maintenance  Follow up with Dr. Alvy Bimler for CLL  It is flu season:   >>>Remember to be washing your hands regularly, using hand sanitizer, be careful to use around herself with has contact with people who are sick will increase her chances of getting sick yourself. >>> Best ways to protect herself from the flu: Receive the yearly flu vaccine, practice good hand hygiene washing with soap and also using hand sanitizer when available, eat a nutritious meals, get adequate rest, hydrate appropriately   Please contact the office if your symptoms worsen or you have concerns that you are not improving.   Thank you for choosing [facility] for your healthcare, and for allowing [place] to partner with you on your healthcare journey. I am thankful to be able to provide care to you today.   Wyn Quaker FNP-C

## 2018-05-23 NOTE — Progress Notes (Signed)
There is no hip fracture.  I still would recommend following up with primary care regarding your right hip pain.  Wyn Quaker, FNP

## 2018-05-23 NOTE — Assessment & Plan Note (Signed)
Assessment: Appears stable today Patient reporting clinical improvement No audible wheezing or rales on assessment today although this was limited due to patient's body habitus and ability to move in motorized wheelchair  Plan: Chest x-ray today

## 2018-05-23 NOTE — Assessment & Plan Note (Signed)
Assessment: Patient with past medical history of osteoporosis previously managed on Fosamax but this was stopped 3 years ago Patient denies any known trauma or falls that could have caused right hip pain Worsened right hip pain over the last 2 weeks which limits patient's mobility and ability to stand today  Plan: Hip x-ray today Schedule appointment with primary care for right hip pain follow-up  Discussed with patient, spouse, son that we will not be managing findings on this hip x-ray that will need to be managed by primary care.  They need to schedule an appointment with primary care for future follow-up.  Due to patient's inability to mobilize well we will try to get x-ray coordinated today as he is also getting a chest x-ray.

## 2018-05-23 NOTE — Progress Notes (Signed)
Slight improvement on chest x-ray.  With patient's clinical improvement we can continue to monitor I do not believe further antibiotics are needed at this time.  We are still waiting on the official read for the hip x-ray.  Wyn Quaker, FNP

## 2018-05-23 NOTE — Assessment & Plan Note (Signed)
Assessment: Recent hospitalization for hypercarbia Patient was maintained on BiPAP in the hospital and discharged on trilogy home vent Patient has been averaging about 4 to 5 hours of trilogy home vent use as well as 1 to 2 hours during the day Patient having difficulty with current settings Family reporting the patient has occasional confusion in the afternoons when they feel like he is retaining more CO2 Post polio -probable restriction, no PFTs  Plan: ABG today Continue trilogy vent [facility]ed [facility] for trilogy vent compliance and settings Patient to follow-up with Dr. Ander Slade at his first available appointment Chest x-ray today

## 2018-05-24 ENCOUNTER — Ambulatory Visit (HOSPITAL_COMMUNITY)
Admission: RE | Admit: 2018-05-24 | Discharge: 2018-05-24 | Disposition: A | Discharge status: Home / Self Care | Payer: Medicare Other | Source: Ambulatory Visit | Attending: Pulmonary Disease | Admitting: Pulmonary Disease

## 2018-05-24 DIAGNOSIS — J9622 Acute and chronic respiratory failure with hypercapnia: Secondary | ICD-10-CM | POA: Insufficient documentation

## 2018-05-24 DIAGNOSIS — L89152 Pressure ulcer of sacral region, stage 2: Secondary | ICD-10-CM | POA: Diagnosis not present

## 2018-05-24 DIAGNOSIS — Z8701 Personal history of pneumonia (recurrent): Secondary | ICD-10-CM | POA: Diagnosis not present

## 2018-05-24 DIAGNOSIS — L304 Erythema intertrigo: Secondary | ICD-10-CM | POA: Diagnosis not present

## 2018-05-24 DIAGNOSIS — B3749 Other urogenital candidiasis: Secondary | ICD-10-CM | POA: Diagnosis not present

## 2018-05-24 LAB — BLOOD GAS, ARTERIAL
Acid-Base Excess: 4.6 mmol/L — ABNORMAL HIGH (ref 0.0–2.0)
Bicarbonate: 29.5 mmol/L — ABNORMAL HIGH (ref 20.0–28.0)
DRAWN BY: 545251
FIO2: 21
O2 Saturation: 94.2 %
Patient temperature: 37
pCO2 arterial: 46.6 mmHg (ref 32.0–48.0)
pH, Arterial: 7.417 (ref 7.350–7.450)
pO2, Arterial: 71.1 mmHg — ABNORMAL LOW (ref 83.0–108.0)

## 2018-05-27 ENCOUNTER — Encounter: Payer: Self-pay | Admitting: Pulmonary Disease

## 2018-05-27 ENCOUNTER — Ambulatory Visit (INDEPENDENT_AMBULATORY_CARE_PROVIDER_SITE_OTHER): Payer: Medicare Other | Admitting: Pulmonary Disease

## 2018-05-27 VITALS — BP 130/66 | HR 86 | Ht 72.0 in | Wt 178.0 lb

## 2018-05-27 DIAGNOSIS — K5900 Constipation, unspecified: Secondary | ICD-10-CM | POA: Diagnosis not present

## 2018-05-27 DIAGNOSIS — R339 Retention of urine, unspecified: Secondary | ICD-10-CM | POA: Diagnosis not present

## 2018-05-27 DIAGNOSIS — J9612 Chronic respiratory failure with hypercapnia: Secondary | ICD-10-CM | POA: Diagnosis not present

## 2018-05-27 NOTE — Patient Instructions (Signed)
Chronic respiratory failure Recent acute on chronic respiratory failure  On trilogy ventilator Compliant with use  Continue using ventilator every night You are clear for surgery from a respiratory perspective as long as you do not develop any new problems-shortness of breath, new cough  Your surgeon should be aware that you have respiratory failure and you may need assistance around time of surgery with a noninvasive ventilator  We will see you back in the office in 3 months

## 2018-05-27 NOTE — Progress Notes (Signed)
Manuel Reeves    [id number]    Oct 03, 1932  Primary Care Physician:Perini, Elta Guadeloupe, MD  Referring Physician: Crist Infante, [facility] [place], [place] [address]  Chief complaint:   Patient with a history of chronic respiratory failure Preop evaluation  HPI:  History of postpolio syndrome Muscle weakness over the last 10 years  Is having prostate surgery History of CLL hospitalized December 2019 with a pneumonia Has recovered fully  He has trilogy ventilator at home which he is using regularly, occasionally does have difficulty tolerating it but has been very compliant  70 arterial blood gas which showed  Smoked in the remote past  He is wheelchair dependent  Outpatient Encounter Medications as of 05/27/2018  Medication Sig  . albuterol (PROVENTIL) (2.5 MG/3ML) 0.083% nebulizer solution Take 3 mLs (2.5 mg total) by nebulization every 6 (six) hours as needed for wheezing or shortness of breath.  . bisacodyl (DULCOLAX) 5 MG EC tablet Take 2 tablets (10 mg total) by mouth daily.  Marland Kitchen buPROPion (WELLBUTRIN SR) 150 MG 12 hr tablet Take 1 tablet by mouth 2 (two) times daily.   . Calcium Carbonate-Vitamin D (CALTRATE 600+D PO) Take 1 tablet by mouth daily.  . ergocalciferol (VITAMIN D2) 50000 units capsule Take 50,000 Units by mouth every 14 (fourteen) days. On Monday  . LORazepam (ATIVAN) 0.5 MG tablet Take 0.5 mg by mouth at bedtime as needed for sleep.  . Multiple Vitamins-Minerals (OCUVITE PO) Take 1 tablet by mouth daily.  . Multiple Vitamins-Minerals (THEREMS-H) TABS Take 1 tablet by mouth daily.  . polyethylene glycol (MIRALAX / GLYCOLAX) packet Take 17 g by mouth daily as needed. (Patient taking differently: Take 17 g by mouth daily as needed for mild constipation. )  . sertraline (ZOLOFT) 50 MG tablet Take 50 mg by mouth daily.  . tamsulosin (FLOMAX) 0.4 MG CAPS capsule Take 1 capsule (0.4 mg total) by mouth daily.  Marland Kitchen zinc oxide 20 % ointment Apply 1  application topically as needed for irritation. To buttocks after every incontinent episode and as needed for redness. May keep at bedside.   No facility-administered encounter medications on file as of 05/27/2018.     Allergies as of 05/27/2018 - Review Complete 05/27/2018  Allergen Reaction Noted  . Penicillins Rash 10/30/2011    Past Medical History:  Diagnosis Date  . Anxiety   . CLL (chronic lymphocytic leukemia) ([place]) 02/20/2014  . Osteoporosis   . Pneumonia   . Polio     Past Surgical History:  Procedure Laterality Date  . CATARACT EXTRACTION  09/2011   at [facility]  . COLONOSCOPY  03/2007  . RETINAL DETACHMENT SURGERY  09/1995  . SPINAL FUSION  10/1952  . TONSILLECTOMY      Family History  Problem Relation Age of Onset  . Colon cancer Sister 33    Social History   Socioeconomic History  . Marital status: Married    Spouse name: Not on file  . Number of children: Not on file  . Years of education: Not on file  . Highest education level: Not on file  Occupational History  . Not on file  Social Needs  . Financial resource strain: Not on file  . Food insecurity:    Worry: Not on file    Inability: Not on file  . Transportation needs:    Medical: Not on file    Non-medical: Not on file  Tobacco Use  . Smoking status:  Former Smoker  . Smokeless tobacco: Never Used  . Tobacco comment: Quit at age 64  Substance and Sexual Activity  . Alcohol use: Yes    Comment: occasional beer or wine manybe monthly  . Drug use: No  . Sexual activity: Not on file  Lifestyle  . Physical activity:    Days per week: Not on file    Minutes per session: Not on file  . Stress: Not on file  Relationships  . Social connections:    Talks on phone: Not on file    Gets together: Not on file    Attends religious service: Not on file    Active member of club or organization: Not on file    Attends meetings of clubs or organizations: Not on file    Relationship status: Not on  file  . Intimate partner violence:    Fear of current or ex partner: Not on file    Emotionally abused: Not on file    Physically abused: Not on file    Forced sexual activity: Not on file  Other Topics Concern  . Not on file  Social History Narrative  . Not on file    Review of Systems  Constitutional: Negative.   HENT: Negative.   Eyes: Negative.   Respiratory: Negative for cough and shortness of breath.   Cardiovascular: Negative.   Gastrointestinal: Negative.   All other systems reviewed and are negative.   Vitals:   05/27/18 0950  BP: 130/66  Pulse: 86  SpO2: 97%     Physical Exam  Constitutional: He is oriented to person, place, and time. He appears well-developed and well-nourished.  HENT:  Head: Normocephalic and atraumatic.  Eyes: Pupils are equal, round, and reactive to light. Conjunctivae and EOM are normal. Right eye exhibits no discharge. Left eye exhibits no discharge.  Neck: Normal range of motion. Neck supple. No tracheal deviation present. No thyromegaly present.  Cardiovascular: Normal rate and regular rhythm.  Pulmonary/Chest: Effort normal and breath sounds normal. No respiratory distress. He has no wheezes. He has no rales.  Abdominal: Soft. Bowel sounds are normal.  Musculoskeletal:     Comments: Wheelchair borne  Neurological: He is alert and oriented to person, place, and time.   Data Reviewed: ABG on 05/24/2018-7.42/40 6/71 Chest x-ray on 05/23/2018-atelectasis at the bases-improving from prior  Assessment:  Chronic respiratory failure  Post polio syndrome with hypoventilation  Atelectasis at the bases  Plan/Recommendations:  Importance of compliance with trilogy ventilator at night discussed with the patient  He has no acutely reversible problems at the present time, he is clear for surgery Spinal anesthesia is appropriate-the risk to his breathing with spinal anesthesia was discussed with the patient  It is important that we are  aware that he may require a noninvasive ventilator perioperatively  We will try and obtain a download, compliance data from his DME company  The importance of using his ventilator nightly was reiterated His questions were answered  Clear for surgery from a pulmonary perspective   Sherrilyn Rist MD [facility] 05/27/2018, 10:14 AM  CC: Crist Infante, MD

## 2018-05-27 NOTE — Progress Notes (Signed)
Discussed in office with Dr. Ander Slade.  Nothing further needed at this time. Wyn Quaker, FNP

## 2018-05-30 ENCOUNTER — Other Ambulatory Visit: Payer: Self-pay | Admitting: Urology

## 2018-05-30 DIAGNOSIS — R972 Elevated prostate specific antigen [PSA]: Secondary | ICD-10-CM | POA: Diagnosis not present

## 2018-05-30 DIAGNOSIS — N401 Enlarged prostate with lower urinary tract symptoms: Secondary | ICD-10-CM | POA: Diagnosis not present

## 2018-05-30 DIAGNOSIS — R351 Nocturia: Secondary | ICD-10-CM | POA: Diagnosis not present

## 2018-05-30 DIAGNOSIS — R338 Other retention of urine: Secondary | ICD-10-CM | POA: Diagnosis not present

## 2018-05-31 NOTE — Progress Notes (Signed)
Left message with Manuel Reeves patient not candidate for [facility] due to home ventilator use for 06-06-2018 surgery

## 2018-06-02 ENCOUNTER — Encounter (HOSPITAL_COMMUNITY): Payer: Self-pay | Admitting: *Deleted

## 2018-06-02 ENCOUNTER — Other Ambulatory Visit: Payer: Self-pay

## 2018-06-02 DIAGNOSIS — L304 Erythema intertrigo: Secondary | ICD-10-CM | POA: Diagnosis not present

## 2018-06-02 DIAGNOSIS — L89152 Pressure ulcer of sacral region, stage 2: Secondary | ICD-10-CM | POA: Diagnosis not present

## 2018-06-02 DIAGNOSIS — B3749 Other urogenital candidiasis: Secondary | ICD-10-CM | POA: Diagnosis not present

## 2018-06-02 DIAGNOSIS — Z8701 Personal history of pneumonia (recurrent): Secondary | ICD-10-CM | POA: Diagnosis not present

## 2018-06-06 DIAGNOSIS — L304 Erythema intertrigo: Secondary | ICD-10-CM | POA: Diagnosis not present

## 2018-06-06 DIAGNOSIS — B3749 Other urogenital candidiasis: Secondary | ICD-10-CM | POA: Diagnosis not present

## 2018-06-06 DIAGNOSIS — Z8701 Personal history of pneumonia (recurrent): Secondary | ICD-10-CM | POA: Diagnosis not present

## 2018-06-06 DIAGNOSIS — L89152 Pressure ulcer of sacral region, stage 2: Secondary | ICD-10-CM | POA: Diagnosis not present

## 2018-06-07 NOTE — Anesthesia Preprocedure Evaluation (Addendum)
Anesthesia Evaluation  Patient identified by MRN, date of birth, ID band Patient awake    Reviewed: Allergy & Precautions, NPO status , Patient's Chart, lab work & pertinent test results  Airway Mallampati: I  TM Distance: >3 FB Neck ROM: Full    Dental no notable dental hx. (+) Teeth Intact, Dental Advisory Given   Pulmonary neg pulmonary ROS, former smoker,    Pulmonary exam normal breath sounds clear to auscultation       Cardiovascular negative cardio ROS Normal cardiovascular exam Rhythm:Regular Rate:Normal     Neuro/Psych PSYCHIATRIC DISORDERS Anxiety Depression H/o polio c/b respiratory insufficiency, BIPAP during the night and intermittently during the day negative neurological ROS     GI/Hepatic negative GI ROS, Neg liver ROS,   Endo/Other  negative endocrine ROS  Renal/GU negative Renal ROS  negative genitourinary   Musculoskeletal  (+) Arthritis ,   Abdominal   Peds  Hematology negative hematology ROS (+) CLL   Anesthesia Other Findings BPH  Reproductive/Obstetrics                            Anesthesia Physical Anesthesia Plan  ASA: III  Anesthesia Plan: Spinal   Post-op Pain Management:    Induction:   PONV Risk Score and Plan: 1 and Treatment may vary due to age or medical condition and Propofol infusion  Airway Management Planned: Natural Airway  Additional Equipment:   Intra-op Plan:   Post-operative Plan:   Informed Consent: I have reviewed the patients History and Physical, chart, labs and discussed the procedure including the risks, benefits and alternatives for the proposed anesthesia with the patient or authorized representative who has indicated his/her understanding and acceptance.     Dental advisory given  Plan Discussed with: CRNA  Anesthesia Plan Comments:       Anesthesia Quick Evaluation

## 2018-06-07 NOTE — Progress Notes (Addendum)
Anesthesia Chart Review   Case:  [id number] Date/Time:  06/10/18 1230   Procedure:  TRANSURETHRAL RESECTION OF THE PROSTATE (TURP) (N/A )   Anesthesia type:  Spinal   Pre-op diagnosis:  BENIGN PROSTATIC HYPERPLASIA, URINARY RETENTION   Location:  [facility] PROCEDURE ROOM / [facility]   Surgeon:  Kathie Rhodes, MD      DISCUSSION: 85 yo former smoker with h/o Polio (residual weakness, wheelchair dependent), anxiety, CLL (remission), chronic respiratory failure (home ventilator use at night), BPH with urinary retention scheduled for above surgery on 06/10/18 with Dr. Kathie Rhodes.    Pt last seen by pulmonology, Dr. Sherrilyn Rist, on 05/27/18.  Per his note, "He has no acutely reversible problems at the present time, he is clear for surgery.  Spinal anesthesia is appropriate-the risk to his breathing with spinal anesthesia was discussed with the patient.  It is important that we are aware that he may require a noninvasive ventilator perioperatively."  Pt last seen by PCP on 06/01/2018, seen by Toy Baker, NP.   Pt can proceed wth planned procedure barring acute status change.  VS: There were no vitals taken for this visit.  PROVIDERS: Crist Infante, MD is PCP   Sherrilyn Rist, MD with Internal Medicine-Pulmonary disease LABS: Last last 06/02/18 on Chart (all labs ordered are listed, but only abnormal results are displayed)  Labs Reviewed - No data to display Elevated WBC 15.3 which appears to be chronic  IMAGES: Chest Xray 05/23/2018 FINDINGS: Most portion of the right chest and right costophrenic angle not imaged. Severe thoracic spine scoliosis again noted make evaluation difficult. Heart size stable. Slight improvement in aeration in both lung bases from prior exam. Small right pleural effusion may be present. No pneumothorax.  IMPRESSION: Bibasilar atelectasis/infiltrates. Slight improvement in aeration of both lung bases from prior exam  EKG: 05/03/18 Rate 89 bpm Sinus  rhythm Left atrial enlargement Probable anteroseptal infarct, old No significant change since last tracing  CV: Echo 05/06/18 Study Conclusions  - Procedure narrative: Image quality was suboptimal. The study was   technically difficult, as a result of poor acoustic windows, poor   sound wave transmission, chest wall deformity, and body habitus. - Left ventricle: The cavity size was normal. Systolic function was   vigorous. The estimated ejection fraction was in the range of 65%   to 70%. Wall motion was normal; there were no regional wall   motion abnormalities. Doppler parameters are consistent with   abnormal left ventricular relaxation (grade 1 diastolic   dysfunction). - Aortic valve: Transvalvular velocity was within the normal range.   There was no stenosis. There was no regurgitation. - Mitral valve: There was no regurgitation. - Right ventricle: The cavity size was mildly dilated. Wall   thickness was normal. Systolic function was normal. - Tricuspid valve: There was trivial regurgitation. - Pulmonary arteries: Systolic pressure could not be accurately   estimated. - Inferior vena cava: Not visualized. - Pericardium, extracardiac: There was no pericardial effusion. Past Medical History:  Diagnosis Date  . Anxiety   . CLL (chronic lymphocytic leukemia) ([place]) 02/20/2014  . Osteoporosis   . Pneumonia   . Polio     Past Surgical History:  Procedure Laterality Date  . CATARACT EXTRACTION  09/2011   at [facility]  . COLONOSCOPY  03/2007  . RETINAL DETACHMENT SURGERY  09/1995  . SPINAL FUSION  10/1952  . TONSILLECTOMY      MEDICATIONS: No current facility-administered medications for this encounter.    Marland Kitchen  albuterol (PROVENTIL) (2.5 MG/3ML) 0.083% nebulizer solution  . bisacodyl (DULCOLAX) 5 MG EC tablet  . buPROPion (WELLBUTRIN SR) 150 MG 12 hr tablet  . doxycycline (VIBRAMYCIN) 100 MG capsule  . LORazepam (ATIVAN) 0.5 MG tablet  . sertraline (ZOLOFT) 50 MG  tablet  . tamsulosin (FLOMAX) 0.4 MG CAPS capsule  . Calcium Carbonate-Vitamin D (CALTRATE 600+D PO)  . ergocalciferol (VITAMIN D2) 50000 units capsule  . Multiple Vitamin (MULTIVITAMIN WITH MINERALS) TABS tablet  . Multiple Vitamins-Minerals (OCUVITE PO)  . polyethylene glycol (MIRALAX / GLYCOLAX) packet  . zinc oxide 20 % ointment     Maia Plan [facility] Pre-Surgical Testing [phone] 06/07/18 11:16 AM

## 2018-06-09 NOTE — Discharge Instructions (Signed)

## 2018-06-09 NOTE — Progress Notes (Signed)
Wife calls to report that patient states a problem with constipation.  Advised wife to call office of Dr Karsten Ro for recommendations.  Wife voiced understanding.

## 2018-06-09 NOTE — H&P (Signed)
HPI: Manuel Reeves is a 85 year-old male with BPH causing urinary retention.  His problem was diagnosed 02/25/2018. His current symptoms did not begin after he had a surgical procedure. His urinary retention is being treated with foley catheter and flomax. Patient denies suprapubic tube, intemittent catheterization, hytrin, cardura, uroxatrol, rapaflo, avodart, and proscar.   His urine has shut off completely.   03/04/18: He presented to the ED on 10/18 with complaints of acute changes in metal status, decreased appetite, abdominal pain, and decreased urine output. He was found to have post obstructive AKI presumed secondary to constipation. Indwelling catheter was also placed at the time. I have no note of how much urine output was noted when foley catheter was initially placed. Per family, urine output was noted to be approximatly 2200 cc. Creatinine on admission was 3.64 with potassium of 6.6 noted. UA at the time was unremarkable, but did show 11-20 RBC's. Urine culture was negative. He was discharged on 10/20. Per discharge note, catheter was removed prior to discharge, but he was unable to void. He was started on Tamsulosin. Creatinine on 10/19 was 0.57 and potassium was 4.3. No recent abdominal imaging noted in PACS. He states that leading up to this event, he was not necessarily having any increased difficulties voiding. He felt is stream was strong and he denied straining. He denied bothersome urgency or frequency. He did have nocturia X 3-4. However, the night prior to his hospital admission, he was voiding very small amounts frequently. He states that he has been tolerating the catheter and it has been draining well. He had some hematuria initially, but he states that his urine has been clear. He denies fever or chills. He is not constipated currently and continues to use Miralax on PRN basis. He is currently in skilled nursing at [facility]. He does have history of post polio syndrome and  CLL.    03/15/18: He returns today for repeat voiding trial. He states that he has been tolerating the catheter and it have been draining well. He denies gross hematuria or fevers. He remains in skilled nursing at [facility]. He remains on Tamsulosin daily.   03/29/18: Returns today with his wife in son. He currently has an indwelling Foley catheter and has failed 2 voiding trials.   ALLERGIES: Penicillins    MEDICATIONS: Tamsulosin Hcl 0.4 mg capsule 2 capsule PO Q HS  Tamsulosin Hcl 0.4 mg capsule  Ativan  Vitamin D3 1,000 unit capsule Oral  Wellbutrin Sr  Zoloft     GU PSH: Catheterization For Collection Of Specimen, Single Patient, All Places Of Service - 05/02/2018, 05/02/2018 Complex cystometrogram, w/ void pressure and urethral pressure profile studies, any technique - 04/12/2018 Complex Uroflow - 04/12/2018 Cystoscopy - 03/29/2018 Emg surf Electrd - 04/12/2018 Inject For cystogram - 04/12/2018 Intrabd voidng Press - 04/12/2018      PSH Notes: Primary Repair Of Ruptured Achilles Tendon, Retinal Detachment Laser Retinopexy Was Applied, Back Surgery, Tonsillectomy   NON-GU PSH: Remove Tonsils - 2011 Repair Achilles Tendon - 2011    GU PMH: Urinary Retention - 03/15/2018, - 03/04/2018 BPH w/LUTS - 03/04/2018 Nocturia - 03/04/2018 Elevated PSA, Elevated prostate specific antigen (PSA) - 2014      PMH Notes: Gross hematuria: He experienced gross hematuria in 3/04 and underwent evaluation with CT scan and cystoscopy by Dr. Jacqualin Combes which were found to be negative.   Elevated PSA. He was found to have an elevated PSA of 5.27 in 12/10. He was evaluated by  Dr. Alinda Money with TRUS/BX on 11/21/09 which revealed a 111 cc prostate.  Pathology: BPH only.     NON-GU PMH: Anxiety, Anxiety (Symptom) - 2014 Personal history of poliomyelitis, History of post-polio syndrome - 2014    FAMILY HISTORY: Acute Bowel Ischemia / Infarction - Mother Breast Cancer - Sister Colon Cancer -  Sister Ulcer, Gastric - Father   SOCIAL HISTORY: Marital Status: Married Preferred Language: English Current Smoking Status: Patient does not smoke anymore.   Tobacco Use Assessment Completed: Used Tobacco in last 30 days? Does drink.  Patient's occupation is/was Retired.     Notes: Former smoker, Marital History - Currently Married, Alcohol Use   REVIEW OF SYSTEMS:    GU Review Male:   Patient denies frequent urination, hard to postpone urination, burning/ pain with urination, get up at night to urinate, leakage of urine, stream starts and stops, trouble starting your stream, have to strain to urinate , erection problems, and penile pain.  Gastrointestinal (Upper):   Patient denies vomiting, indigestion/ heartburn, and nausea.  Gastrointestinal (Lower):   Patient denies diarrhea and constipation.  Constitutional:   Patient denies fever, night sweats, weight loss, and fatigue.  Skin:   Patient denies skin rash/ lesion and itching.  Eyes:   Patient denies blurred vision and double vision.  Ears/ Nose/ Throat:   Patient denies sore throat and sinus problems.  Hematologic/Lymphatic:   Patient denies swollen glands and easy bruising.  Cardiovascular:   Patient denies leg swelling and chest pains.  Respiratory:   Patient denies cough and shortness of breath.  Endocrine:   Patient denies excessive thirst.  Musculoskeletal:   Patient denies back pain and joint pain.  Neurological:   Patient denies headaches and dizziness.  Psychologic:   Patient denies depression and anxiety.   VITAL SIGNS:    Weight   178 lb / 80.74 kg  Height   72 in / 182.88 cm  BP   121/72 mmHg  Pulse 99 /min 106 /min  Temperature   97.3 F / 36.2 C  BMI 24.1 kg/m   GU PHYSICAL EXAMINATION:    Scrotum: No lesions. No edema. No cysts. No warts.  Epididymides: Right: no spermatocele, no masses, no cysts, no tenderness, no induration, no enlargement. Left: no spermatocele, no masses, no cysts, no tenderness, no  induration, no enlargement.  Testes: No tenderness, no swelling, no enlargement left testes. No tenderness, no swelling, no enlargement right testes. Normal location left testes. Normal location right testes. No mass, no cyst, no varicocele, no hydrocele left testes. No mass, no cyst, no varicocele, no hydrocele right testes.  Urethral Meatus: Normal size. No lesion, no wart, no discharge, no polyp. Normal location.  Penis: Penile foley catheter present. Draining clear, yellow urine. Circumcised, no foreskin warts, no cracks. No dorsal peyronie's plaques, no left corporal peyronie's plaques, no right corporal peyronie's plaques, no scarring, no shaft warts. No balanitis, no meatal stenosis.    MULTI-SYSTEM PHYSICAL EXAMINATION:    Constitutional: Well-nourished. No physical deformities. Normally developed. Good grooming.  Neck: Neck symmetrical, not swollen. Normal tracheal position.  Respiratory: Normal breath sounds. No labored breathing, no use of accessory muscles.   Cardiovascular: Regular rate and rhythm. No murmur, no gallop. Normal temperature, normal extremity pulses, no swelling, no varicosities.   Neurologic / Psychiatric: Oriented to time, oriented to place, oriented to person. No depression, no anxiety, no agitation.  Gastrointestinal: No mass, no tenderness, no rigidity, non obese abdomen.  Musculoskeletal: Normal gait and station of  head and neck. Uses a wheelchair.     PAST DATA REVIEWED:  Source Of History:  Patient   10/16/10 04/09/10  PSA  Total PSA 3.82  4.42   Free PSA  0.9   % Free PSA  20     PROCEDURES: Urodynamics 04/12/18:  CMG: Was found to have a maximum cystometric capacity of 430 cc with no detrusor instability.  Pressure-flow: He was able to generate a voluntary contraction with only a small amount voided (19 cc at 2 cc/sec.) But was able to generate a detrusor pressure of 50 cm H2O.  EMG: Normal  Fluoroscopy: The bladder was highly trabeculated with an early  Christmas tree configuration.   Impression: He is able to generate a detrusor contraction of 50 cm H2O and therefore has a good probability of spontaneous voiding with transurethral outlet resistance surgery.   ASSESSMENT:     We discussed the fact that he has demonstrated an adequate detrusor contraction by urodynamics with outlet obstruction secondary to his prostate.  After having discussed the management options previously elected to proceed with transurethral resection of his prostate.  I went over the procedure with him in detail including of the procedure is performed, the risks and complications, the probability of success, the need for overnight stay in the hospital as well as the anticipated postoperative course.  He understands and has elected to proceed.  A preoperative urine culture was found to be positive and the organism was sensitive to doxycycline.  He has been maintained on this preoperatively.  PLAN: TURP

## 2018-06-10 ENCOUNTER — Ambulatory Visit (HOSPITAL_COMMUNITY): Payer: Medicare Other | Admitting: Physician Assistant

## 2018-06-10 ENCOUNTER — Encounter (HOSPITAL_COMMUNITY): Payer: Self-pay | Admitting: *Deleted

## 2018-06-10 ENCOUNTER — Encounter (INDEPENDENT_AMBULATORY_CARE_PROVIDER_SITE_OTHER): Payer: Self-pay

## 2018-06-10 ENCOUNTER — Other Ambulatory Visit: Payer: Self-pay

## 2018-06-10 ENCOUNTER — Ambulatory Visit (HOSPITAL_COMMUNITY)
Admission: RE | Admit: 2018-06-10 | Discharge: 2018-06-14 | Disposition: A | Discharge status: Home / Self Care | Payer: Medicare Other | Attending: Urology | Admitting: Urology

## 2018-06-10 ENCOUNTER — Encounter (HOSPITAL_COMMUNITY)
Admission: RE | Disposition: A | Discharge status: Home / Self Care | Payer: Self-pay | Source: Home / Self Care | Attending: Urology

## 2018-06-10 DIAGNOSIS — N4289 Other specified disorders of prostate: Secondary | ICD-10-CM | POA: Insufficient documentation

## 2018-06-10 DIAGNOSIS — N401 Enlarged prostate with lower urinary tract symptoms: Secondary | ICD-10-CM | POA: Diagnosis not present

## 2018-06-10 DIAGNOSIS — Z87891 Personal history of nicotine dependence: Secondary | ICD-10-CM | POA: Diagnosis not present

## 2018-06-10 DIAGNOSIS — N138 Other obstructive and reflux uropathy: Secondary | ICD-10-CM

## 2018-06-10 DIAGNOSIS — Z8612 Personal history of poliomyelitis: Secondary | ICD-10-CM | POA: Diagnosis not present

## 2018-06-10 DIAGNOSIS — R0689 Other abnormalities of breathing: Secondary | ICD-10-CM | POA: Insufficient documentation

## 2018-06-10 DIAGNOSIS — E875 Hyperkalemia: Secondary | ICD-10-CM | POA: Diagnosis not present

## 2018-06-10 DIAGNOSIS — E871 Hypo-osmolality and hyponatremia: Secondary | ICD-10-CM | POA: Diagnosis not present

## 2018-06-10 DIAGNOSIS — Z856 Personal history of leukemia: Secondary | ICD-10-CM | POA: Diagnosis not present

## 2018-06-10 DIAGNOSIS — R338 Other retention of urine: Secondary | ICD-10-CM | POA: Diagnosis not present

## 2018-06-10 DIAGNOSIS — N320 Bladder-neck obstruction: Secondary | ICD-10-CM | POA: Diagnosis not present

## 2018-06-10 DIAGNOSIS — Z79899 Other long term (current) drug therapy: Secondary | ICD-10-CM | POA: Diagnosis not present

## 2018-06-10 DIAGNOSIS — F419 Anxiety disorder, unspecified: Secondary | ICD-10-CM | POA: Insufficient documentation

## 2018-06-10 HISTORY — DX: Scoliosis, unspecified: M41.9

## 2018-06-10 HISTORY — PX: TRANSURETHRAL RESECTION OF PROSTATE: SHX73

## 2018-06-10 LAB — BASIC METABOLIC PANEL
Anion gap: 10 (ref 5–15)
BUN: 16 mg/dL (ref 8–23)
CO2: 25 mmol/L (ref 22–32)
Calcium: 9.1 mg/dL (ref 8.9–10.3)
Chloride: 101 mmol/L (ref 98–111)
Creatinine, Ser: 0.5 mg/dL — ABNORMAL LOW (ref 0.61–1.24)
GFR calc Af Amer: >60 mL/min (ref >60)
GFR calc non Af Amer: >60 mL/min (ref >60)
Glucose, Bld: 96 mg/dL (ref 70–99)
Potassium: 5 mmol/L (ref 3.5–5.1)
Sodium: 136 mmol/L (ref 135–145)

## 2018-06-10 LAB — CBC
HCT: 43.5 % (ref 39.0–52.0)
Hemoglobin: 13.4 g/dL (ref 13.0–17.0)
MCH: 30.9 pg (ref 26.0–34.0)
MCHC: 30.8 g/dL (ref 30.0–36.0)
MCV: 100.2 fL — ABNORMAL HIGH (ref 80.0–100.0)
Platelets: 256 10*3/uL (ref 150–400)
RBC: 4.34 MIL/uL (ref 4.22–5.81)
RDW: 14.4 % (ref 11.5–15.5)
WBC: 17 10*3/uL — ABNORMAL HIGH (ref 4.0–10.5)
nRBC: 0 % (ref 0.0–0.2)

## 2018-06-10 SURGERY — TURP (TRANSURETHRAL RESECTION OF PROSTATE)
Anesthesia: Spinal

## 2018-06-10 MED ORDER — 0.9 % SODIUM CHLORIDE (POUR BTL) OPTIME
TOPICAL | Status: DC | PRN
  Administered 2018-06-10: 1000 mL

## 2018-06-10 MED ORDER — SODIUM CHLORIDE 0.9 % IV SOLN
INTRAVENOUS | Status: DC | PRN
  Administered 2018-06-10: 50 ug/min via INTRAVENOUS

## 2018-06-10 MED ORDER — SODIUM CHLORIDE 0.9 % IR SOLN
Status: DC | PRN
  Administered 2018-06-10: 24000 mL via INTRAVESICAL

## 2018-06-10 MED ORDER — ACETAMINOPHEN 325 MG PO TABS: 650.0000 mg | ORAL_TABLET | ORAL | Status: DC | PRN

## 2018-06-10 MED ORDER — PHENYLEPHRINE 40 MCG/ML (10ML) SYRINGE FOR IV PUSH (FOR BLOOD PRESSURE SUPPORT)
PREFILLED_SYRINGE | INTRAVENOUS | Status: AC
  Filled 2018-06-10: qty 10

## 2018-06-10 MED ORDER — FENTANYL CITRATE (PF) 100 MCG/2ML IJ SOLN
INTRAMUSCULAR | Status: AC
  Filled 2018-06-10: qty 2

## 2018-06-10 MED ORDER — CIPROFLOXACIN IN D5W 400 MG/200ML IV SOLN
400.0000 mg | Freq: Two times a day (BID) | INTRAVENOUS | Status: AC
  Administered 2018-06-11: 400 mg via INTRAVENOUS
  Filled 2018-06-10: qty 200

## 2018-06-10 MED ORDER — ALBUTEROL SULFATE HFA 108 (90 BASE) MCG/ACT IN AERS
INHALATION_SPRAY | RESPIRATORY_TRACT | Status: AC
  Filled 2018-06-10: qty 6.7

## 2018-06-10 MED ORDER — PHENAZOPYRIDINE HCL 200 MG PO TABS: 200.0000 mg | ORAL_TABLET | Freq: Three times a day (TID) | ORAL | 0 refills | Status: DC | PRN

## 2018-06-10 MED ORDER — CIPROFLOXACIN IN D5W 400 MG/200ML IV SOLN
400.0000 mg | Freq: Once | INTRAVENOUS | Status: AC
  Administered 2018-06-10: 400 mg via INTRAVENOUS
  Filled 2018-06-10: qty 200

## 2018-06-10 MED ORDER — HYDROCODONE-ACETAMINOPHEN 5-325 MG PO TABS: 1.0000 | ORAL_TABLET | ORAL | Status: DC | PRN

## 2018-06-10 MED ORDER — BUPIVACAINE IN DEXTROSE 0.75-8.25 % IT SOLN
INTRATHECAL | Status: DC | PRN
  Administered 2018-06-10: 1.6 mL via INTRATHECAL

## 2018-06-10 MED ORDER — PHENYLEPHRINE 40 MCG/ML (10ML) SYRINGE FOR IV PUSH (FOR BLOOD PRESSURE SUPPORT)
PREFILLED_SYRINGE | INTRAVENOUS | Status: DC | PRN
  Administered 2018-06-10: 80 ug via INTRAVENOUS
  Administered 2018-06-10: 120 ug via INTRAVENOUS
  Administered 2018-06-10: 80 ug via INTRAVENOUS
  Administered 2018-06-10: 40 ug via INTRAVENOUS
  Administered 2018-06-10: 120 ug via INTRAVENOUS

## 2018-06-10 MED ORDER — ACETAMINOPHEN 500 MG PO TABS
1000.0000 mg | ORAL_TABLET | Freq: Four times a day (QID) | ORAL | Status: AC
  Administered 2018-06-10 – 2018-06-11 (×3): 1000 mg via ORAL
  Filled 2018-06-10 (×3): qty 2

## 2018-06-10 MED ORDER — SODIUM CHLORIDE 0.9 % IR SOLN
3000.0000 mL | Status: DC
  Administered 2018-06-10 – 2018-06-11 (×6): 3000 mL

## 2018-06-10 MED ORDER — BELLADONNA ALKALOIDS-OPIUM 16.2-60 MG RE SUPP
1.0000 | Freq: Four times a day (QID) | RECTAL | Status: DC | PRN
  Administered 2018-06-12 – 2018-06-13 (×2): 1 via RECTAL
  Filled 2018-06-10 (×2): qty 1

## 2018-06-10 MED ORDER — HYDROCODONE-ACETAMINOPHEN 10-325 MG PO TABS: 1.0000 | ORAL_TABLET | ORAL | 0 refills | Status: DC | PRN

## 2018-06-10 MED ORDER — ONDANSETRON HCL 4 MG/2ML IJ SOLN: 4.0000 mg | INTRAMUSCULAR | Status: DC | PRN

## 2018-06-10 MED ORDER — BACITRACIN-NEOMYCIN-POLYMYXIN 400-5-5000 EX OINT: 1.0000 "application " | TOPICAL_OINTMENT | Freq: Three times a day (TID) | CUTANEOUS | Status: DC | PRN

## 2018-06-10 MED ORDER — MIDAZOLAM HCL 2 MG/2ML IJ SOLN
INTRAMUSCULAR | Status: AC
  Filled 2018-06-10: qty 2

## 2018-06-10 MED ORDER — STERILE WATER FOR IRRIGATION IR SOLN
Status: DC | PRN
  Administered 2018-06-10: 500 mL

## 2018-06-10 MED ORDER — ONDANSETRON HCL 4 MG/2ML IJ SOLN
INTRAMUSCULAR | Status: AC
  Filled 2018-06-10: qty 2

## 2018-06-10 MED ORDER — SODIUM CHLORIDE 0.9 % IV SOLN
INTRAVENOUS | Status: DC
  Administered 2018-06-10 – 2018-06-14 (×5): via INTRAVENOUS

## 2018-06-10 MED ORDER — MIDAZOLAM HCL 5 MG/5ML IJ SOLN
INTRAMUSCULAR | Status: DC | PRN
  Administered 2018-06-10 (×2): 1 mg via INTRAVENOUS

## 2018-06-10 MED ORDER — MORPHINE SULFATE (PF) 2 MG/ML IV SOLN: 2.0000 mg | INTRAVENOUS | Status: DC | PRN

## 2018-06-10 MED ORDER — CIPROFLOXACIN HCL 500 MG PO TABS: 500.0000 mg | ORAL_TABLET | Freq: Two times a day (BID) | ORAL | 0 refills | Status: DC

## 2018-06-10 MED ORDER — FENTANYL CITRATE (PF) 100 MCG/2ML IJ SOLN
INTRAMUSCULAR | Status: DC | PRN
  Administered 2018-06-10 (×2): 50 ug via INTRAVENOUS

## 2018-06-10 MED ORDER — ENSURE ENLIVE PO LIQD
237.0000 mL | Freq: Two times a day (BID) | ORAL | Status: DC
  Administered 2018-06-11 – 2018-06-13 (×6): 237 mL via ORAL

## 2018-06-10 MED ORDER — PROPOFOL 500 MG/50ML IV EMUL
INTRAVENOUS | Status: DC | PRN
  Administered 2018-06-10: 15 ug/kg/min via INTRAVENOUS

## 2018-06-10 MED ORDER — FENTANYL CITRATE (PF) 100 MCG/2ML IJ SOLN: 25.0000 ug | INTRAMUSCULAR | Status: DC | PRN

## 2018-06-10 MED ORDER — LACTATED RINGERS IV SOLN
INTRAVENOUS | Status: DC
  Administered 2018-06-10 (×2): via INTRAVENOUS

## 2018-06-10 SURGICAL SUPPLY — 22 items
BAG URINE DRAINAGE (UROLOGICAL SUPPLIES) ×1 IMPLANT
BAG URO CATCHER STRL LF (MISCELLANEOUS) ×2 IMPLANT
BLADE SURG 15 STRL LF DISP TIS (BLADE) IMPLANT
BLADE SURG 15 STRL SS (BLADE)
CATH FOLEY 3WAY 30CC 24FR (CATHETERS) ×2
CATH URTH STD 24FR FL 3W 2 (CATHETERS) ×1 IMPLANT
COVER WAND RF STERILE (DRAPES) IMPLANT
ELECT REM PT RETURN 15FT ADLT (MISCELLANEOUS) ×2 IMPLANT
EVACUATOR MICROVAS BLADDER (UROLOGICAL SUPPLIES) ×2 IMPLANT
GLOVE BIOGEL M 8.0 STRL (GLOVE) ×2 IMPLANT
GOWN STRL REUS W/ TWL XL LVL3 (GOWN DISPOSABLE) ×1 IMPLANT
GOWN STRL REUS W/TWL XL LVL3 (GOWN DISPOSABLE) ×4 IMPLANT
LOOP CUT BIPOLAR 24F LRG (ELECTROSURGICAL) ×1 IMPLANT
MANIFOLD NEPTUNE II (INSTRUMENTS) ×2 IMPLANT
NS IRRIG 1000ML POUR BTL (IV SOLUTION) ×2 IMPLANT
PACK CYSTO (CUSTOM PROCEDURE TRAY) ×2 IMPLANT
SET ASPIRATION TUBING (TUBING) ×2 IMPLANT
SUT ETHILON 3 0 PS 1 (SUTURE) IMPLANT
SYR 30ML LL (SYRINGE) ×1 IMPLANT
TUBING CONNECTING 10 (TUBING) ×2 IMPLANT
TUBING UROLOGY SET (TUBING) ×2 IMPLANT
WIRE COONS/BENSON .038X145CM (WIRE) IMPLANT

## 2018-06-10 NOTE — Anesthesia Postprocedure Evaluation (Signed)
Anesthesia Post Note  Patient: EGIDIO LOFGREN  Procedure(s) Performed: TRANSURETHRAL RESECTION OF THE PROSTATE (TURP) (N/A )     Patient location during evaluation: PACU Anesthesia Type: Spinal Level of consciousness: oriented and awake and alert Pain management: pain level controlled Vital Signs Assessment: post-procedure vital signs reviewed and stable Respiratory status: spontaneous breathing, respiratory function stable and patient connected to nasal cannula oxygen Cardiovascular status: blood pressure returned to baseline and stable Postop Assessment: no headache, no backache and no apparent nausea or vomiting Anesthetic complications: no    Last Vitals:  Vitals:   06/10/18 1530 06/10/18 1545  BP: (!) 113/55 115/60  Pulse: 70 69  Resp: 18 17  Temp:  37 C  SpO2: 100% 100%    Last Pain:  Vitals:   06/10/18 1500  TempSrc:   PainSc: 0-No pain                 Kanyon Bunn L Aracelie Addis

## 2018-06-10 NOTE — Anesthesia Procedure Notes (Signed)
Procedure Name: MAC Date/Time: 06/10/2018 12:56 PM Performed by: Deliah Boston, CRNA Pre-anesthesia Checklist: Patient identified, Emergency Drugs available, Suction available and Patient being monitored Patient Re-evaluated:Patient Re-evaluated prior to induction Oxygen Delivery Method: Simple face mask Preoxygenation: Pre-oxygenation with 100% oxygen Number of attempts: 1 Placement Confirmation: positive ETCO2 and breath sounds checked- equal and bilateral

## 2018-06-10 NOTE — Progress Notes (Signed)
Patient has called for this writer several times thus far complaining that he does not think his home CPAP machine is functioning properly or on the correct settings.  This Probation officer has explained to patient that I can not make adjustments on his home equipment but I can offer him a machine to use in its place.  Pt. Has declines the offer.

## 2018-06-10 NOTE — Progress Notes (Signed)
RESPIRATORY THERAPY contacted and informed RN pt may be cared for on regular med-surg floor with home BI-PAP not requiring oxygen.

## 2018-06-10 NOTE — Transfer of Care (Signed)
Immediate Anesthesia Transfer of Care Note  Patient: Manuel Reeves  Procedure(s) Performed: Procedure(s): TRANSURETHRAL RESECTION OF THE PROSTATE (TURP) (N/A)  Patient Location: PACU  Anesthesia Type:MAC and Spinal  Level of Consciousness: Patient easily awoken, sedated, comfortable, cooperative, following commands, responds to stimulation.   Airway & Oxygen Therapy: Patient spontaneously breathing, ventilating well, oxygen via simple oxygen mask.  Post-op Assessment: Report given to PACU RN, vital signs reviewed and stable, moving all extremities.   Post vital signs: Reviewed and stable.  Complications: No apparent anesthesia complications  Last Vitals:  Vitals Value Taken Time  BP 138/96 06/10/2018  2:39 PM  Temp    Pulse 64 06/10/2018  2:44 PM  Resp 13 06/10/2018  2:44 PM  SpO2 100 % 06/10/2018  2:44 PM  Vitals shown include unvalidated device data.  Last Pain:  Vitals:   06/10/18 1204  TempSrc:   PainSc: 0-No pain      Patients Stated Pain Goal: 3 (39/03/00 9233)  Complications: No apparent anesthesia complications

## 2018-06-10 NOTE — Op Note (Signed)
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  He has a history of post polio syndrome and has undergone voiding trials without success despite tamsulosin.  He underwent urodynamics which revealed adequate detrusor contraction but obstruction.  Preop culture was performed as he did elect to undergo transurethral resection of his prostate and this was found to be positive but sensitive to doxycycline and he was placed on this up until the time of surgery.  It was also sensitive to Cipro and this was administered preoperatively intravenously.  ANESTHESIA:  General  EBL: 100 cc  DRAINS: 24 French three-way Foley catheter  SPECIMEN:  Prostate chips to pathology  After informed consent the patient was brought to the major OR and placed on the table. He was administered general anesthesia and then moved to the dorsal lithotomy position. His genitalia was sterilely prepped and draped and an official timeout was then performed.  Initially the 28 French resectoscope sheath with the visual obturator was passed into the bladder and the obturator removed. I then inserted the resectoscope element with 30 lens and  performed a systematic inspection of the bladder. I noted the bladder had 3+ trabeculation but was free of any tumor stones or inflammatory lesions. The ureteral orifices were noted to be of normal configuration and position. Withdrawing the scope into the prostatic urethra I noted obstructing bilobar hypertrophy with elongation of the prostatic urethra and a median lobe component.  In addition there appeared to be an elevated somewhat fixed bladder neck.  Resection was then begun. I first resected the median lobe in the midline down to the level of the bladder neck. I then  began resecting the left lobe of the prostate by resecting first from the level of the bladder neck back to the level of the Veru at the 5:00 position and then progressed in a counterclockwise direction resecting all of the adenomatous tissue of the left lobe down to the surgical capsule. Bleeding points were cauterized as they were encountered. I then turned my attention to the right lobe of the prostate and it was resected in an identical fashion. Tissue in the area of the apex was then resected circumferentially with care being taken to maintain the resection proximal to the Veru at all times. The prostatic chips were then flushed into the bladder and the Microvasive evacuator was then used to evacuate all chips from the bladder. Reinspection of the bladder revealed the mucosa to be intact, the ureteral orifices intact as well and well away from the bladder neck and area of resection. There were no prostatic chips remaining within the bladder. The prostatic capsule was intact throughout with no perforation and there was no active bleeding noted at the end of the procedure.  The resectoscope was therefore removed and the 24 French three-way Foley catheter was then inserted and balloon was filled to 30 cc. This was placed on mild traction and the bladder was irrigated with the irrigant returning clear. The catheter was then hooked to closed system drainage and continuous irrigation and the patient was awakened and taken to recovery room in stable and satisfactory condition. He tolerated the procedure well and there were no intraoperative complications.  PLAN OF CARE: Observation overnight with anticipated discharge in the morning.  PATIENT DISPOSITION:  PACU - Hemodynamically stable.

## 2018-06-10 NOTE — Anesthesia Procedure Notes (Signed)
Spinal  Patient location during procedure: OR Start time: 06/10/2018 1:00 PM End time: 06/10/2018 1:10 PM Staffing Anesthesiologist: Freddrick March, MD Performed: anesthesiologist  Preanesthetic Checklist Completed: patient identified, surgical consent, pre-op evaluation, timeout performed, IV checked, risks and benefits discussed and monitors and equipment checked Spinal Block Patient position: sitting Prep: site prepped and draped and DuraPrep Patient monitoring: cardiac monitor, continuous pulse ox and blood pressure Approach: midline Location: L3-4 Injection technique: single-shot Needle Needle type: Pencan  Needle gauge: 24 G Needle length: 9 cm Assessment Sensory level: T6 Additional Notes Functioning IV was confirmed and monitors were applied. Sterile prep and drape, including hand hygiene and sterile gloves were used. The patient was positioned and the spine was prepped. The skin was anesthetized with lidocaine.  Free flow of clear CSF was obtained prior to injecting local anesthetic into the CSF.  The spinal needle aspirated freely following injection.  The needle was carefully withdrawn.  The patient tolerated the procedure well.

## 2018-06-10 NOTE — Progress Notes (Signed)
RT assisted pt. with initial set up/placement of home Trilogy CPAP, on room air with no humidity, made aware to notify if needed.

## 2018-06-11 ENCOUNTER — Encounter (HOSPITAL_COMMUNITY): Payer: Self-pay | Admitting: Urology

## 2018-06-11 DIAGNOSIS — Z87891 Personal history of nicotine dependence: Secondary | ICD-10-CM | POA: Diagnosis not present

## 2018-06-11 DIAGNOSIS — R338 Other retention of urine: Secondary | ICD-10-CM | POA: Diagnosis not present

## 2018-06-11 DIAGNOSIS — N4289 Other specified disorders of prostate: Secondary | ICD-10-CM | POA: Diagnosis not present

## 2018-06-11 DIAGNOSIS — Z8612 Personal history of poliomyelitis: Secondary | ICD-10-CM | POA: Diagnosis not present

## 2018-06-11 DIAGNOSIS — N138 Other obstructive and reflux uropathy: Secondary | ICD-10-CM | POA: Diagnosis not present

## 2018-06-11 DIAGNOSIS — N401 Enlarged prostate with lower urinary tract symptoms: Secondary | ICD-10-CM | POA: Diagnosis not present

## 2018-06-11 MED ORDER — LORAZEPAM 0.5 MG PO TABS
0.5000 mg | ORAL_TABLET | Freq: Every evening | ORAL | Status: DC | PRN
  Administered 2018-06-11 – 2018-06-13 (×3): 0.5 mg via ORAL
  Filled 2018-06-11 (×3): qty 1

## 2018-06-11 NOTE — Progress Notes (Signed)
1 Day Post-Op Subjective: Patient reports no urge to void yet. No problems overnight.  Objective: Vital signs in last 24 hours: Temp:  [97.8 F (36.6 C)-98.6 F (37 C)] 97.8 F (36.6 C) (02/01 0606) Pulse Rate:  [66-97] 85 (02/01 0606) Resp:  [15-21] 16 (02/01 0606) BP: (92-138)/(55-96) 113/56 (02/01 0606) SpO2:  [97 %-100 %] 100 % (02/01 0606) Weight:  [81.3 kg] 81.3 kg (01/31 1106)  Intake/Output from previous day: 01/31 0701 - 02/01 0700 In: 19953.5 [P.O.:240; I.V.:2713.5] Out: 34742 [VZDGL:87564; Blood:100] Intake/Output this shift: No intake/output data recorded.  Physical Exam:  Constitutional: Vital signs reviewed. WD WN in NAD   Eyes: PERRL, No scleral icterus.   Cardiovascular: RRR Pulmonary/Chest: Normal effort \  Lab Results: Recent Labs    06/10/18 1225  HGB 13.4  HCT 43.5   BMET Recent Labs    06/10/18 1225  NA 136  K 5.0  CL 101  CO2 25  GLUCOSE 96  BUN 16  CREATININE 0.50*  CALCIUM 9.1   No results for input(s): LABPT, INR in the last 72 hours. No results for input(s): LABURIN in the last 72 hours. Results for orders placed or performed during the hospital encounter of 05/03/18  Culture, Urine     Status: None   Collection Time: 05/03/18  5:33 PM  Result Value Ref Range Status   Specimen Description   Final    URINE, RANDOM Performed at [facility] [address], [place], [place] [address]    Special Requests   Final    NONE Performed at [facility], [place] [address], [place], [place] [address]    Culture   Final    Multiple bacterial morphotypes present, none predominant. Suggest appropriate recollection if clinically indicated.   Report Status 05/05/2018 FINAL  Final  MRSA PCR Screening     Status: Abnormal   Collection Time: 05/03/18  5:33 PM  Result Value Ref Range Status   MRSA by PCR POSITIVE (A) NEGATIVE Final    Comment:        The GeneXpert MRSA Assay (FDA approved for NASAL  specimens only), is one component of a comprehensive MRSA colonization surveillance program. It is not intended to diagnose MRSA infection nor to guide or monitor treatment for MRSA infections. RESULT CALLED TO, READ BACK BY AND VERIFIED WITH: S.LANEY AT 2026 ON 05/03/18 BY N.THOMPSON Performed at [facility], [place] [address] [place], [place] [address]   Respiratory Panel by PCR     Status: None   Collection Time: 05/03/18  7:09 PM  Result Value Ref Range Status   Adenovirus NOT DETECTED NOT DETECTED Final   Coronavirus 229E NOT DETECTED NOT DETECTED Final   Coronavirus HKU1 NOT DETECTED NOT DETECTED Final   Coronavirus NL63 NOT DETECTED NOT DETECTED Final   Coronavirus OC43 NOT DETECTED NOT DETECTED Final   Metapneumovirus NOT DETECTED NOT DETECTED Final   Rhinovirus / Enterovirus NOT DETECTED NOT DETECTED Final   Influenza A NOT DETECTED NOT DETECTED Final   Influenza B NOT DETECTED NOT DETECTED Final   Parainfluenza Virus 1 NOT DETECTED NOT DETECTED Final   Parainfluenza Virus 2 NOT DETECTED NOT DETECTED Final   Parainfluenza Virus 3 NOT DETECTED NOT DETECTED Final   Parainfluenza Virus 4 NOT DETECTED NOT DETECTED Final   Respiratory Syncytial Virus NOT DETECTED NOT DETECTED Final   Bordetella pertussis NOT DETECTED NOT DETECTED Final   Chlamydophila pneumoniae NOT DETECTED NOT DETECTED Final   Mycoplasma  pneumoniae NOT DETECTED NOT DETECTED Final    Comment: Performed at [facility], [place] [address] [place], [place] [address]  Culture, blood (Routine X 2) w Reflex to ID Panel     Status: None   Collection Time: 05/04/18 11:54 AM  Result Value Ref Range Status   Specimen Description   Final    BLOOD RIGHT ANTECUBITAL Performed at [facility] [address] [place], [place] [address]    Special Requests   Final    BOTTLES DRAWN AEROBIC ONLY Blood Culture results may not be optimal due to an inadequate volume of blood received  in culture bottles Performed at [facility] [address] [place], [place] [address]    Culture   Final    NO GROWTH 5 DAYS Performed at [facility], [place] [address] [place], [place] [address]    Report Status 05/09/2018 FINAL  Final  Culture, blood (Routine X 2) w Reflex to ID Panel     Status: None   Collection Time: 05/04/18 12:07 PM  Result Value Ref Range Status   Specimen Description   Final    BLOOD RIGHT HAND Performed at [facility] [address] [place], [place] [address]    Special Requests   Final    BOTTLES DRAWN AEROBIC ONLY Blood Culture adequate volume Performed at [facility] [address], [place], [place] [address]    Culture   Final    NO GROWTH 5 DAYS Performed at [facility], [place] [address], [place], [place] [address]    Report Status 05/09/2018 FINAL  Final    Studies/Results: No results found.  Assessment/Plan:   POD 1 TURP  No void yet.  Will d/c later today with or w/o catheter.   LOS: 0 days   Jorja Loa 06/11/2018, 9:54 AM

## 2018-06-11 NOTE — Progress Notes (Signed)
Called to patients room to help with placement of head gear for home machine.  Wife in room and asked her if she was familiar with equipment and she stated that "we have people that do that for [place]".  She stated that she doesn't mess with the machine.  This writer placed head gear on patient.

## 2018-06-11 NOTE — Progress Notes (Addendum)
At 12pm patient still unable to void.  Bladder scanner showing 250cc's in bladder.  18 french coude cath placed, 500cc's of dark red urine returned with multiple small clots.   MD made aware.

## 2018-06-11 NOTE — Progress Notes (Signed)
Patient places his self on home machine without assistance.

## 2018-06-12 DIAGNOSIS — Z87891 Personal history of nicotine dependence: Secondary | ICD-10-CM | POA: Diagnosis not present

## 2018-06-12 DIAGNOSIS — Z8612 Personal history of poliomyelitis: Secondary | ICD-10-CM | POA: Diagnosis not present

## 2018-06-12 DIAGNOSIS — N401 Enlarged prostate with lower urinary tract symptoms: Secondary | ICD-10-CM | POA: Diagnosis not present

## 2018-06-12 DIAGNOSIS — R338 Other retention of urine: Secondary | ICD-10-CM | POA: Diagnosis not present

## 2018-06-12 DIAGNOSIS — N138 Other obstructive and reflux uropathy: Secondary | ICD-10-CM | POA: Diagnosis not present

## 2018-06-12 DIAGNOSIS — N4289 Other specified disorders of prostate: Secondary | ICD-10-CM | POA: Diagnosis not present

## 2018-06-12 MED ORDER — LIDOCAINE HCL URETHRAL/MUCOSAL 2 % EX GEL
1.0000 "application " | Freq: Once | CUTANEOUS | Status: AC
  Administered 2018-06-12: 1 via URETHRAL
  Filled 2018-06-12: qty 11
  Filled 2018-06-12: qty 5

## 2018-06-12 NOTE — Progress Notes (Signed)
2 Days Post-Op Subjective: Patient reports no problems with catheter overnight.  He has required some irrigation.  Objective: Vital signs in last 24 hours: Temp:  [97.9 F (36.6 C)-98.5 F (36.9 C)] 97.9 F (36.6 C) (02/02 3009) Pulse Rate:  [84-98] 98 (02/02 0632) Resp:  [16-18] 18 (02/02 0632) BP: (106-128)/(53-66) 128/66 (02/02 2330) SpO2:  [92 %-96 %] 93 % (02/02 0762)  Intake/Output from previous day: 02/01 0701 - 02/02 0700 In: 980 [P.O.:580; I.V.:400] Out: 1127 [Urine:1125; Stool:2] Intake/Output this shift: No intake/output data recorded.  Physical Exam:  Constitutional: Vital signs reviewed. WD WN in NAD   Eyes: PERRL, No scleral icterus.   Cardiovascular: RRR Pulmonary/Chest: Normal effort Extremities: No cyanosis or edema   Urine quite bloody.  I was unable to irrigate any clots through his 60 French catheter.   Lab Results: Recent Labs    06/10/18 1225  HGB 13.4  HCT 43.5   BMET Recent Labs    06/10/18 1225  NA 136  K 5.0  CL 101  CO2 25  GLUCOSE 96  BUN 16  CREATININE 0.50*  CALCIUM 9.1   No results for input(s): LABPT, INR in the last 72 hours. No results for input(s): LABURIN in the last 72 hours. Results for orders placed or performed during the hospital encounter of 05/03/18  Culture, Urine     Status: None   Collection Time: 05/03/18  5:33 PM  Result Value Ref Range Status   Specimen Description   Final    URINE, RANDOM Performed at [facility] [address], [place], [place] [address]    Special Requests   Final    NONE Performed at [facility], [place] [address] [place], [place] [address]    Culture   Final    Multiple bacterial morphotypes present, none predominant. Suggest appropriate recollection if clinically indicated.   Report Status 05/05/2018 FINAL  Final  MRSA PCR Screening     Status: Abnormal   Collection Time: 05/03/18  5:33 PM  Result Value Ref Range Status   MRSA by PCR  POSITIVE (A) NEGATIVE Final    Comment:        The GeneXpert MRSA Assay (FDA approved for NASAL specimens only), is one component of a comprehensive MRSA colonization surveillance program. It is not intended to diagnose MRSA infection nor to guide or monitor treatment for MRSA infections. RESULT CALLED TO, READ BACK BY AND VERIFIED WITH: S.LANEY AT 2026 ON 05/03/18 BY N.THOMPSON Performed at [facility], [place] [address], [place], [place] [address]   Respiratory Panel by PCR     Status: None   Collection Time: 05/03/18  7:09 PM  Result Value Ref Range Status   Adenovirus NOT DETECTED NOT DETECTED Final   Coronavirus 229E NOT DETECTED NOT DETECTED Final   Coronavirus HKU1 NOT DETECTED NOT DETECTED Final   Coronavirus NL63 NOT DETECTED NOT DETECTED Final   Coronavirus OC43 NOT DETECTED NOT DETECTED Final   Metapneumovirus NOT DETECTED NOT DETECTED Final   Rhinovirus / Enterovirus NOT DETECTED NOT DETECTED Final   Influenza A NOT DETECTED NOT DETECTED Final   Influenza B NOT DETECTED NOT DETECTED Final   Parainfluenza Virus 1 NOT DETECTED NOT DETECTED Final   Parainfluenza Virus 2 NOT DETECTED NOT DETECTED Final   Parainfluenza Virus 3 NOT DETECTED NOT DETECTED Final   Parainfluenza Virus 4 NOT DETECTED NOT DETECTED Final   Respiratory Syncytial Virus NOT DETECTED NOT DETECTED Final   Bordetella  pertussis NOT DETECTED NOT DETECTED Final   Chlamydophila pneumoniae NOT DETECTED NOT DETECTED Final   Mycoplasma pneumoniae NOT DETECTED NOT DETECTED Final    Comment: Performed at [facility], [place] [address] [place], [place] [address]  Culture, blood (Routine X 2) w Reflex to ID Panel     Status: None   Collection Time: 05/04/18 11:54 AM  Result Value Ref Range Status   Specimen Description   Final    BLOOD RIGHT ANTECUBITAL Performed at [facility] [address] [place], [place] [address]    Special Requests   Final    BOTTLES DRAWN  AEROBIC ONLY Blood Culture results may not be optimal due to an inadequate volume of blood received in culture bottles Performed at [facility] [address], [place], [place] [address]    Culture   Final    NO GROWTH 5 DAYS Performed at [facility], [place] [address], [place], [place] [address]    Report Status 05/09/2018 FINAL  Final  Culture, blood (Routine X 2) w Reflex to ID Panel     Status: None   Collection Time: 05/04/18 12:07 PM  Result Value Ref Range Status   Specimen Description   Final    BLOOD RIGHT HAND Performed at [facility] [address], [place], [place] [address]    Special Requests   Final    BOTTLES DRAWN AEROBIC ONLY Blood Culture adequate volume Performed at [facility] [address] [place], [place] [address]    Culture   Final    NO GROWTH 5 DAYS Performed at [facility], [place] [address], [place], [place] [address]    Report Status 05/09/2018 FINAL  Final    Studies/Results: No results found.  Assessment/Plan:   Postoperative day #2 TURP.  Unfortunately, he did not void after his catheter removal yesterday.  Currently, he is not draining out well and I feel that there are most likely clots in his bladder.  I will have a larger three-way catheter placed and irrigate later.  I do not think he will be going home today.   LOS: 0 days   Jorja Loa 06/12/2018, 7:43 AM

## 2018-06-12 NOTE — Progress Notes (Signed)
Nutrition Brief Note  Patient identified on the Malnutrition Screening Tool (MST) Report  Patient's weights have remained stable over the past year. Pt has been ordered ensure supplements. Pt from facility where he is provided 3 meals a day.   Wt Readings from Last 15 Encounters:  06/10/18 81.3 kg  05/27/18 80.7 kg  05/03/18 82.6 kg  04/22/18 82.6 kg  04/11/18 84 kg  03/31/18 84 kg  03/02/17 90.7 kg  03/23/12 93 kg  03/09/12 93 kg    Body mass index is 24.32 kg/m. Patient meets criteria for normal based on current BMI.   Current diet order is regular. Labs and medications reviewed.   No nutrition interventions warranted at this time. If nutrition issues arise, please consult RD.   Manuel Bibles, MS, RD, [place] Dietitian Pager: [phone] After Hours Pager: [phone]

## 2018-06-12 NOTE — Progress Notes (Signed)
Pt. Was placed on home Bipap by nursing staff.

## 2018-06-13 DIAGNOSIS — N401 Enlarged prostate with lower urinary tract symptoms: Secondary | ICD-10-CM | POA: Diagnosis not present

## 2018-06-13 DIAGNOSIS — Z8612 Personal history of poliomyelitis: Secondary | ICD-10-CM | POA: Diagnosis not present

## 2018-06-13 DIAGNOSIS — Z87891 Personal history of nicotine dependence: Secondary | ICD-10-CM | POA: Diagnosis not present

## 2018-06-13 DIAGNOSIS — N138 Other obstructive and reflux uropathy: Secondary | ICD-10-CM | POA: Diagnosis not present

## 2018-06-13 DIAGNOSIS — N4289 Other specified disorders of prostate: Secondary | ICD-10-CM | POA: Diagnosis not present

## 2018-06-13 DIAGNOSIS — R338 Other retention of urine: Secondary | ICD-10-CM | POA: Diagnosis not present

## 2018-06-13 NOTE — Progress Notes (Signed)
Reported to Dr Karsten Ro results of flushing of 3way foley cath very dark bloody urine back and he wants 20 ml added to catheter bulb and little more traction and monitor. He will not d/c today.

## 2018-06-13 NOTE — Progress Notes (Signed)
Patient removed his BiPAP, O2 sat went down to 85% when pt drifted to sleep; encouraged pt to put bipap back on but pt stated that it makes her mouth so dry; pt refused to put it back at this time; pt was put on nasal cannula at 2L . Will continue to monitor patient.

## 2018-06-13 NOTE — Progress Notes (Signed)
Patient ID: Manuel Reeves, male   DOB: [birth year], 85 y.o.   MRN: [id number] 3 Days Post-Op  Assessment: Persistent gross hematuria status post TURP:  There was a lot of venous bleeding but no arterial bleeding at the end of his surgery.  This was controlled with catheter traction however he continues to have bleeding that appears to be venous and my concern is that he develops clot retention so his catheter has been irrigated with only a few small clots.  I will maintain the catheter on traction and reassess him in the morning but will hold on discharge again today.  Plan:  1.  Continue Foley catheter. 2. Add 20 cc to catheter balloon to bring it to 50 cc which is what controlled his hematuria with traction after surgery. 3. Hand irrigate p.r.n.. 4. Maintain traction. 5. Reassess suitability for discharge in a.m..   Subjective: Patient is without complaint.  Objective: Vital signs in last 24 hours: Temp:  [98 F (36.7 C)-99.1 F (37.3 C)] 98 F (36.7 C) (02/03 1422) Pulse Rate:  [83-98] 83 (02/03 1422) Resp:  [16-18] 16 (02/03 1422) BP: (105-145)/(59-80) 105/60 (02/03 1422) SpO2:  [93 %-99 %] 98 % (02/03 1422)  Intake/Output from previous day: 02/02 0701 - 02/03 0700 In: 1620 [P.O.:300; I.V.:1200] Out: 3040 [Urine:3040] Intake/Output this shift: Total I/O In: 1630 [P.O.:480; I.V.:900; Other:250] Out: Nixon [Urine:1050]  Past Medical History:  Diagnosis Date  . Anxiety   . CLL (chronic lymphocytic leukemia) ([place]) 02/20/2014  . Osteoporosis   . Pneumonia   . Polio   . Scoliosis    Current Facility-Administered Medications  Medication Dose Route Frequency Provider Last Rate Last Dose  . 0.9 %  sodium chloride infusion   Intravenous Continuous Kathie Rhodes, MD 100 mL/hr at 06/12/18 2056    . acetaminophen (TYLENOL) tablet 650 mg  650 mg Oral Q4H PRN Kathie Rhodes, MD      . feeding supplement (ENSURE ENLIVE) (ENSURE ENLIVE) liquid 237 mL  237 mL Oral BID BM Kathie Rhodes, MD   237 mL at 06/13/18 1000  . HYDROcodone-acetaminophen (NORCO/VICODIN) 5-325 MG per tablet 1-2 tablet  1-2 tablet Oral Q4H PRN Kathie Rhodes, MD      . lactated ringers infusion   Intravenous Continuous Hulan Fray L, MD 50 mL/hr at 06/10/18 1215    . LORazepam (ATIVAN) tablet 0.5 mg  0.5 mg Oral QHS PRN Franchot Gallo, MD   0.5 mg at 06/12/18 2053  . morphine 2 MG/ML injection 2-4 mg  2-4 mg Intravenous Q2H PRN Kathie Rhodes, MD      . neomycin-bacitracin-polymyxin (NEOSPORIN) ointment packet 1 application  1 application Topical TID PRN Kathie Rhodes, MD      . ondansetron (ZOFRAN) injection 4 mg  4 mg Intravenous Q4H PRN Kathie Rhodes, MD      . opium-belladonna (B&O SUPPRETTES) 16.2-60 MG suppository 1 suppository  1 suppository Rectal Q6H PRN Kathie Rhodes, MD   1 suppository at 06/12/18 [id number]  . sodium chloride irrigation 0.9 % 3,000 mL  3,000 mL Irrigation Continuous Kathie Rhodes, MD   3,000 mL at 06/11/18 0357    Physical Exam:  General: Patient is in no apparent distress Lungs: Normal respiratory effort, chest expands symmetrically. GI: The abdomen is soft and nontender without mass. GU:  Foley catheter indwelling draining bloody urine with no clots.    Lab Results: No results for input(s): WBC, HGB, HCT in the last 72 hours. BMET No results for input(s): NA, K, CL,  CO2, GLUCOSE, BUN, CREATININE, CALCIUM in the last 72 hours. No results for input(s): LABPT, INR in the last 72 hours. No results for input(s): LABURIN in the last 72 hours. Results for orders placed or performed during the hospital encounter of 05/03/18  Culture, Urine     Status: None   Collection Time: 05/03/18  5:33 PM  Result Value Ref Range Status   Specimen Description   Final    URINE, RANDOM Performed at [facility] [address] [place], [place] [address]    Special Requests   Final    NONE Performed at [facility], [place] [address],  [place], [place] [address]    Culture   Final    Multiple bacterial morphotypes present, none predominant. Suggest appropriate recollection if clinically indicated.   Report Status 05/05/2018 FINAL  Final  MRSA PCR Screening     Status: Abnormal   Collection Time: 05/03/18  5:33 PM  Result Value Ref Range Status   MRSA by PCR POSITIVE (A) NEGATIVE Final    Comment:        The GeneXpert MRSA Assay (FDA approved for NASAL specimens only), is one component of a comprehensive MRSA colonization surveillance program. It is not intended to diagnose MRSA infection nor to guide or monitor treatment for MRSA infections. RESULT CALLED TO, READ BACK BY AND VERIFIED WITH: S.LANEY AT 2026 ON 05/03/18 BY N.THOMPSON Performed at [facility], [place] [address] , [place] [address]   Respiratory Panel by PCR     Status: None   Collection Time: 05/03/18  7:09 PM  Result Value Ref Range Status   Adenovirus NOT DETECTED NOT DETECTED Final   Coronavirus 229E NOT DETECTED NOT DETECTED Final   Coronavirus HKU1 NOT DETECTED NOT DETECTED Final   Coronavirus NL63 NOT DETECTED NOT DETECTED Final   Coronavirus OC43 NOT DETECTED NOT DETECTED Final   Metapneumovirus NOT DETECTED NOT DETECTED Final   Rhinovirus / Enterovirus NOT DETECTED NOT DETECTED Final   Influenza A NOT DETECTED NOT DETECTED Final   Influenza B NOT DETECTED NOT DETECTED Final   Parainfluenza Virus 1 NOT DETECTED NOT DETECTED Final   Parainfluenza Virus 2 NOT DETECTED NOT DETECTED Final   Parainfluenza Virus 3 NOT DETECTED NOT DETECTED Final   Parainfluenza Virus 4 NOT DETECTED NOT DETECTED Final   Respiratory Syncytial Virus NOT DETECTED NOT DETECTED Final   Bordetella pertussis NOT DETECTED NOT DETECTED Final   Chlamydophila pneumoniae NOT DETECTED NOT DETECTED Final   Mycoplasma pneumoniae NOT DETECTED NOT DETECTED Final    Comment: Performed at [facility], [place] [address] [place], [place] [address]   Culture, blood (Routine X 2) w Reflex to ID Panel     Status: None   Collection Time: 05/04/18 11:54 AM  Result Value Ref Range Status   Specimen Description   Final    BLOOD RIGHT ANTECUBITAL Performed at [facility] [address], [place], [place] [address]    Special Requests   Final    BOTTLES DRAWN AEROBIC ONLY Blood Culture results may not be optimal due to an inadequate volume of blood received in culture bottles Performed at [facility] [address] [place], [place] [address]    Culture   Final    NO GROWTH 5 DAYS Performed at [facility], [place] [address] [place], [place] [address]    Report Status 05/09/2018 FINAL  Final  Culture, blood (Routine  X 2) w Reflex to ID Panel     Status: None   Collection Time: 05/04/18 12:07 PM  Result Value Ref Range Status   Specimen Description   Final    BLOOD RIGHT HAND Performed at [facility] [address], [place], [place] [address]    Special Requests   Final    BOTTLES DRAWN AEROBIC ONLY Blood Culture adequate volume Performed at [facility] [address], [place], [place] [address]    Culture   Final    NO GROWTH 5 DAYS Performed at [facility], [place] [address], [place], [place] [address]    Report Status 05/09/2018 FINAL  Final    Studies/Results: No results found.     Claybon Jabs 06/13/2018, 4:53 PM

## 2018-06-13 NOTE — Progress Notes (Signed)
Spoke w/ Dr Karsten Ro r/t urine very dark blood colored and he wants me to flush the catheter and call him in about 1-2 hrs on results.

## 2018-06-14 DIAGNOSIS — Z8612 Personal history of poliomyelitis: Secondary | ICD-10-CM | POA: Diagnosis not present

## 2018-06-14 DIAGNOSIS — N4289 Other specified disorders of prostate: Secondary | ICD-10-CM | POA: Diagnosis not present

## 2018-06-14 DIAGNOSIS — N401 Enlarged prostate with lower urinary tract symptoms: Secondary | ICD-10-CM | POA: Diagnosis not present

## 2018-06-14 DIAGNOSIS — N138 Other obstructive and reflux uropathy: Secondary | ICD-10-CM | POA: Diagnosis not present

## 2018-06-14 DIAGNOSIS — Z87891 Personal history of nicotine dependence: Secondary | ICD-10-CM | POA: Diagnosis not present

## 2018-06-14 DIAGNOSIS — R338 Other retention of urine: Secondary | ICD-10-CM | POA: Diagnosis not present

## 2018-06-14 NOTE — Discharge Summary (Signed)
Physician Discharge Summary      Patient ID: Manuel Reeves MRN: [id number] DOB/AGE: [birth year] 83 y.o.  Admit date: 06/10/2018 Discharge date: 06/14/2018  Admission Diagnoses: BENIGN PROSTATIC HYPERPLASIA, URINARY RETENTION  Discharge Diagnoses:  Active Problems:   BPH with urinary obstruction   Discharged Condition: good  Hospital Course: He underwent an elective transurethral resection of his prostate.  The gland was quite vascular and at the end of the surgery there was still some venous oozing but there was no arterial bleeding identified.  A catheter was placed on traction and this resulted in complete clearing of the urine while on traction.  The plan initially was for observation overnight however he continued to have some bleeding and therefore was maintained in the hospital.  Catheter had to be changed to a larger bore catheter after a smaller one had been replaced as he was having difficulty voiding initially so this was left indwelling and some clots were irrigated out initially but over the past 24 hours he has had slightly bloody urine but is not having any clots and his catheter is draining without need for irrigation.  The last time it was irrigated only a few small clots were identified.  We discussed the options and he is anxious to go home.  I think this is okay.  I have discussed with him that I would like the catheter to remain on traction as much as possible.  It will remain indwelling and be removed for a voiding trial at the time of his follow-up visit.  He will be maintained on antibiotics postoperatively.  Discharge Exam: Blood pressure 116/61, pulse 83, temperature 98.4 F (36.9 C), temperature source Oral, resp. rate 14, height 6' (1.829 m), weight 81.3 kg, SpO2 96 %. General: Awake, alert and in no apparent distress. Chest: Normal respiratory effort. Cardiovascular: Regular rate and rhythm. Abdomen: Soft, nontender, nondistended. GU: Foley catheter draining  bloody urine without clots.   Disposition:  He will be discharged home.  We discussed forcing fluids and also observation of the catheter and if there was absence of drainage from the catheter he will return to my office for irrigation as needed.   Allergies as of 06/14/2018      Reactions   Penicillins Rash   Has patient had a PCN reaction causing immediate rash, facial/tongue/throat swelling, SOB or lightheadedness with hypotension: Y Has patient had a PCN reaction causing severe rash involving mucus membranes or skin necrosis: N Has patient had a PCN reaction that required hospitalization: N Has patient had a PCN reaction occurring within the last 10 years: N If all of the above answers are "NO", then may proceed with Cephalosporin use.         Medication List    STOP taking these medications   polyethylene glycol packet Commonly known as:  MIRALAX / GLYCOLAX     TAKE these medications   albuterol (2.5 MG/3ML) 0.083% nebulizer solution Commonly known as:  PROVENTIL Take 3 mLs (2.5 mg total) by nebulization every 6 (six) hours as needed for wheezing or shortness of breath.   bisacodyl 5 MG EC tablet Commonly known as:  DULCOLAX Take 2 tablets (10 mg total) by mouth daily. What changed:    when to take this  reasons to take this   buPROPion 150 MG 12 hr tablet Commonly known as:  WELLBUTRIN SR Take 150 mg by mouth 2 (two) times daily.   CALTRATE 600+D PO Take 1 tablet by mouth daily.  ciprofloxacin 500 MG tablet Commonly known as:  CIPRO Take 1 tablet (500 mg total) by mouth 2 (two) times daily.   doxycycline 100 MG capsule Commonly known as:  VIBRAMYCIN Take 100 mg by mouth 2 (two) times daily.   ergocalciferol 1.25 MG (50000 UT) capsule Commonly known as:  VITAMIN D2 Take 50,000 Units by mouth every 14 (fourteen) days. On Monday   HYDROcodone-acetaminophen 10-325 MG tablet Commonly known as:  NORCO Take 1-2 tablets by mouth every 4 (four) hours as needed  for moderate pain. Maximum dose per 24 hours - 8 pills   LORazepam 0.5 MG tablet Commonly known as:  ATIVAN Take 0.5 mg by mouth at bedtime.   multivitamin with minerals Tabs tablet Take 1 tablet by mouth daily.   OCUVITE PO Take 1 tablet by mouth daily.   phenazopyridine 200 MG tablet Commonly known as:  PYRIDIUM Take 1 tablet (200 mg total) by mouth 3 (three) times daily as needed for pain.   sertraline 50 MG tablet Commonly known as:  ZOLOFT Take 50 mg by mouth daily.   tamsulosin 0.4 MG Caps capsule Commonly known as:  FLOMAX Take 1 capsule (0.4 mg total) by mouth daily. What changed:  when to take this   zinc oxide 20 % ointment Apply 1 application topically as needed for irritation. To buttocks after every incontinent episode and as needed for redness. May keep at bedside.      Follow-up Information    [facility] On 06/20/2018.   Why:  For your appiontment at 10:30 Contact information: [place]          Signed: Claybon Jabs 06/14/2018, 7:43 AM

## 2018-06-21 ENCOUNTER — Other Ambulatory Visit: Payer: Self-pay

## 2018-06-21 ENCOUNTER — Encounter (HOSPITAL_COMMUNITY): Payer: Self-pay

## 2018-06-21 ENCOUNTER — Emergency Department (HOSPITAL_COMMUNITY)
Admission: EM | Admit: 2018-06-21 | Discharge: 2018-06-21 | Disposition: A | Discharge status: Home / Self Care | Payer: Medicare Other | Attending: Emergency Medicine | Admitting: Emergency Medicine

## 2018-06-21 DIAGNOSIS — R339 Retention of urine, unspecified: Secondary | ICD-10-CM | POA: Diagnosis not present

## 2018-06-21 DIAGNOSIS — Z87891 Personal history of nicotine dependence: Secondary | ICD-10-CM | POA: Insufficient documentation

## 2018-06-21 DIAGNOSIS — R1084 Generalized abdominal pain: Secondary | ICD-10-CM | POA: Diagnosis not present

## 2018-06-21 DIAGNOSIS — Z856 Personal history of leukemia: Secondary | ICD-10-CM | POA: Diagnosis not present

## 2018-06-21 DIAGNOSIS — Z79899 Other long term (current) drug therapy: Secondary | ICD-10-CM | POA: Diagnosis not present

## 2018-06-21 DIAGNOSIS — R0902 Hypoxemia: Secondary | ICD-10-CM | POA: Diagnosis not present

## 2018-06-21 DIAGNOSIS — K5900 Constipation, unspecified: Secondary | ICD-10-CM | POA: Diagnosis not present

## 2018-06-21 DIAGNOSIS — R1030 Lower abdominal pain, unspecified: Secondary | ICD-10-CM | POA: Diagnosis not present

## 2018-06-21 LAB — URINALYSIS, ROUTINE W REFLEX MICROSCOPIC
Bacteria, UA: NONE SEEN
Bilirubin Urine: NEGATIVE
Glucose, UA: NEGATIVE mg/dL
Ketones, ur: 20 mg/dL — AB
Nitrite: POSITIVE — AB
Protein, ur: >=300 mg/dL — AB
RBC / HPF: >50 RBC/hpf — ABNORMAL HIGH (ref 0–5)
Specific Gravity, Urine: 1.014 (ref 1.005–1.030)
WBC, UA: >50 WBC/hpf — ABNORMAL HIGH (ref 0–5)
pH: 6 (ref 5.0–8.0)

## 2018-06-21 MED ORDER — LIDOCAINE HCL URETHRAL/MUCOSAL 2 % EX GEL
1.0000 "application " | Freq: Once | CUTANEOUS | Status: AC
  Administered 2018-06-21: 1 via URETHRAL
  Filled 2018-06-21: qty 5

## 2018-06-21 NOTE — ED Provider Notes (Signed)
[facility] DEPT Provider Note   CSN: [id number] Arrival date & time: 06/21/18  1419     History   Chief Complaint Chief Complaint  Patient presents with  . Constipation  . Dysuria    HPI Manuel Reeves is a 85 y.o. male.  The history is provided by the patient.  Dysuria  Presenting symptoms: dysuria   Presenting symptoms comment:  Retention, chronic/intermittient, had foley until yesterday and was pulled. Context: spontaneously   Relieved by:  Nothing Worsened by:  Nothing Associated symptoms: genital rash and urinary retention   Associated symptoms: no abdominal pain, no diarrhea, no fever, no flank pain, no genital itching, no genital lesions, no groin pain, no hematuria, no penile redness, no penile swelling, no priapism, no scrotal swelling, no urinary frequency and no vomiting   Risk factors comment:  Hx of paraplegia, BPH s/p recent resection    Past Medical History:  Diagnosis Date  . Anxiety   . CLL (chronic lymphocytic leukemia) ([place]) 02/20/2014  . Osteoporosis   . Pneumonia   . Polio   . Scoliosis     Patient Active Problem List   Diagnosis Date Noted  . BPH with urinary obstruction 06/10/2018  . Acute and chronic respiratory failure with hypercapnia ([place]) 05/23/2018  . Right hip pain 05/23/2018  . History of ETT   . Acute respiratory failure with hypoxemia ([place])   . Goals of care, counseling/discussion   . Bronchitis with bronchospasm   . HCAP (healthcare-associated pneumonia) 05/03/2018  . Hyponatremia 05/03/2018  . BPH (benign prostatic hyperplasia) 04/22/2018  . Depression with anxiety 04/22/2018  . Cough 04/22/2018  . Aspiration pneumonia ([place]) 04/22/2018  . Leukocytosis 02/25/2018  . 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 Acute kidney failure ([place]) 02/24/2018  . Constipation 02/24/2018  . Hyperkalemia, diminished renal excretion 02/24/2018  . Delirium 02/24/2018  . Acute bilateral obstructive uropathy  02/24/2018  . CLL (chronic lymphocytic leukemia) ([place]) 02/20/2014    Past Surgical History:  Procedure Laterality Date  . CATARACT EXTRACTION  09/2011   at [facility]  . COLONOSCOPY  03/2007  . RETINAL DETACHMENT SURGERY  09/1995  . SPINAL FUSION  10/1952  . TONSILLECTOMY    . TRANSURETHRAL RESECTION OF PROSTATE N/A 06/10/2018   Procedure: TRANSURETHRAL RESECTION OF THE PROSTATE (TURP);  Surgeon: Kathie Rhodes, MD;  Location: [facility] ORS;  Service: Urology;  Laterality: N/A;        Home Medications    Prior to Admission medications   Medication Sig Start Date End Date Taking? Authorizing Provider  albuterol (PROVENTIL) (2.5 MG/3ML) 0.083% nebulizer solution Take 3 mLs (2.5 mg total) by nebulization every 6 (six) hours as needed for wheezing or shortness of breath. Patient taking differently: Take 2.5 mg by nebulization 2 (two) times daily.  05/09/18  Yes Patrecia Pour, MD  bisacodyl (DULCOLAX) 5 MG EC tablet Take 2 tablets (10 mg total) by mouth daily. Patient taking differently: Take 10 mg by mouth daily as needed (constipation).  05/10/18  Yes Patrecia Pour, MD  buPROPion (WELLBUTRIN SR) 150 MG 12 hr tablet Take 150 mg by mouth 2 (two) times daily.  02/24/12  Yes [provider]  Calcium Carbonate-Vitamin D (CALTRATE 600+D PO) Take 1 tablet by mouth daily.   Yes [provider]  ergocalciferol (VITAMIN D2) 50000 units capsule Take 50,000 Units by mouth every 14 (fourteen) days. On Monday   Yes [provider]  LORazepam (ATIVAN) 0.5 MG tablet Take 0.5 mg by  mouth at bedtime.    Yes [provider]  Multiple Vitamins-Minerals (OCUVITE PO) Take 1 tablet by mouth daily.   Yes [provider]  multivitamin-iron-minerals-folic acid (CENTRUM) chewable tablet Chew 1 tablet by mouth daily.   Yes [provider]  phenazopyridine (PYRIDIUM) 200 MG tablet Take 1 tablet (200 mg total) by mouth 3 (three) times daily as needed for pain. 06/10/18  Yes  Kathie Rhodes, MD  sertraline (ZOLOFT) 50 MG tablet Take 50 mg by mouth daily.   Yes [provider]  tamsulosin (FLOMAX) 0.4 MG CAPS capsule Take 1 capsule (0.4 mg total) by mouth daily. Patient taking differently: Take 0.4 mg by mouth every evening.  02/28/18  Yes Shelly Coss, MD  ciprofloxacin (CIPRO) 500 MG tablet Take 1 tablet (500 mg total) by mouth 2 (two) times daily. Patient not taking: Reported on 06/21/2018 06/10/18   Kathie Rhodes, MD  HYDROcodone-acetaminophen [facility]) 10-325 MG tablet Take 1-2 tablets by mouth every 4 (four) hours as needed for moderate pain. Maximum dose per 24 hours - 8 pills Patient not taking: Reported on 06/21/2018 06/10/18   Kathie Rhodes, MD    Family History Family History  Problem Relation Age of Onset  . Colon cancer Sister 49    Social History Social History   Tobacco Use  . Smoking status: Former Research scientist (life sciences)  . Smokeless tobacco: Never Used  . Tobacco comment: Quit at age 70  Substance Use Topics  . Alcohol use: Yes    Comment: occasional beer or wine manybe monthly  . Drug use: No     Allergies   Penicillins   Review of Systems Review of Systems  Constitutional: Negative for chills and fever.  HENT: Negative for ear pain and sore throat.   Eyes: Negative for pain and visual disturbance.  Respiratory: Negative for cough and shortness of breath.   Cardiovascular: Negative for chest pain and palpitations.  Gastrointestinal: Positive for constipation. Negative for abdominal pain, diarrhea and vomiting.  Genitourinary: Positive for decreased urine volume, dysuria and urgency. Negative for flank pain, frequency, hematuria, penile swelling and scrotal swelling.  Musculoskeletal: Negative for arthralgias and back pain.  Skin: Negative for color change and rash.  Neurological: Negative for seizures and syncope.  All other systems reviewed and are negative.    Physical Exam Updated Vital Signs  ED Triage Vitals  Enc Vitals  Group     BP 06/21/18 1432 137/75     Pulse Rate 06/21/18 1432 89     Resp 06/21/18 1432 16     Temp 06/21/18 1432 97.6 F (36.4 C)     Temp Source 06/21/18 1432 Oral     SpO2 06/21/18 1432 96 %     Weight 06/21/18 1428 178 lb 9.2 oz (81 kg)     Height 06/21/18 1428 6' (1.829 m)     Head Circumference --      Peak Flow --      Pain Score 06/21/18 1431 4     Pain Loc --      Pain Edu? --      Excl. in [place]? --     Physical Exam Vitals signs and nursing note reviewed.  Constitutional:      Appearance: He is well-developed.  HENT:     Head: Normocephalic and atraumatic.     Nose: Nose normal.     Mouth/Throat:     Mouth: Mucous membranes are moist.  Eyes:     Conjunctiva/sclera: Conjunctivae normal.  Pupils: Pupils are equal, round, and reactive to light.  Neck:     Musculoskeletal: Normal range of motion and neck supple.  Cardiovascular:     Rate and Rhythm: Normal rate and regular rhythm.     Pulses: Normal pulses.     Heart sounds: Normal heart sounds. No murmur.  Pulmonary:     Effort: Pulmonary effort is normal. No respiratory distress.     Breath sounds: Normal breath sounds.  Abdominal:     General: There is no distension.     Palpations: Abdomen is soft.     Tenderness: There is no abdominal tenderness.  Skin:    General: Skin is warm and dry.     Capillary Refill: Capillary refill takes less than 2 seconds.  Neurological:     Mental Status: He is alert and oriented to person, place, and time. Mental status is at baseline.     Cranial Nerves: No cranial nerve deficit.     Sensory: No sensory deficit.     Motor: Weakness (chronic in legs) present.  Psychiatric:        Mood and Affect: Mood normal.      ED Treatments / Results  Labs (all labs ordered are listed, but only abnormal results are displayed) Labs Reviewed  URINALYSIS, ROUTINE W REFLEX MICROSCOPIC - Abnormal; Notable for the following components:      Result Value   Color, Urine AMBER (*)      APPearance CLOUDY (*)    Hgb urine dipstick LARGE (*)    Ketones, ur 20 (*)    Protein, ur >=300 (*)    Nitrite POSITIVE (*)    Leukocytes,Ua LARGE (*)    RBC / HPF >50 (*)    WBC, UA >50 (*)    All other components within normal limits  URINE CULTURE    EKG None  Radiology No results found.  Procedures Fecal disimpaction Date/Time: 06/21/2018 5:05 PM Performed by: Lennice Sites, DO Authorized by: Lennice Sites, DO  Consent: Verbal consent obtained. Risks and benefits: risks, benefits and alternatives were discussed Consent given by: patient Patient understanding: patient states understanding of the procedure being performed Patient consent: the patient's understanding of the procedure matches consent given Required items: required blood products, implants, devices, and special equipment available Patient identity confirmed: verbally with patient Time out: Immediately prior to procedure a "time out" was called to verify the correct patient, procedure, equipment, support staff and site/side marked as required. Preparation: Patient was prepped and draped in the usual sterile fashion. Local anesthesia used: no  Anesthesia: Local anesthesia used: no  Sedation: Patient sedated: no  Patient tolerance: Patient tolerated the procedure well with no immediate complications    (including critical care time)  Medications Ordered in ED Medications  lidocaine (XYLOCAINE) 2 % jelly 1 application (1 application Urethral Given 06/21/18 1636)     Initial Impression / Assessment and Plan / ED Course  I have reviewed the triage vital signs and the nursing notes.  Pertinent labs & imaging results that were available during my care of the patient were reviewed by me and considered in my medical decision making (see chart for details).     TIVIS WHERRY is an 85 year old male with history of BPH, paraplegia, constipation who presents to the ED with urinary retention.   Patient with normal vitals.  No fever.  Had a Foley catheter removed yesterday at urology office following surgery on his prostate.  Was told to return to the  ED if he had retention after 24 hours.  He states that he has had incomplete emptying.  Has a history of this prior to his prostate surgery as well.  Also possibly has a neurogenic bladder as well.  Patient has baseline neurological exam.  Doubt any spinal process.  Bladder scan showed 240 cc of urine.  Foley catheter was easily placed.  There is no bacteria seen.  Overall the urinalysis is dirty as patient does still have a lot of sediment.  Will send culture.  Overall this is a non-concerning urinalysis.  Will hold on antibiotics at this time.  Recommend following up with urology sooner than appointment that is already scheduled.  Patient states that he also struggles with constipation and I was able to disimpact some stool from him.  He is on docusate and MiraLAX.  Given instructions on increasing the use of the MiraLAX.  Discharged from ED in good condition and given return precautions.  This chart was dictated using voice recognition software.  Despite best efforts to proofread,  errors can occur which can change the documentation meaning.    Final Clinical Impressions(s) / ED Diagnoses   Final diagnoses:  Urinary retention  Constipation, unspecified constipation type    ED Discharge Orders    None       Lennice Sites, DO 06/21/18 1732

## 2018-06-21 NOTE — Discharge Instructions (Addendum)
Follow-up with urology as discussed, return to the ED if any worsening symptoms.

## 2018-06-21 NOTE — ED Triage Notes (Addendum)
Pt BIB EMS from [facility] independent living with his wife. Pt has not had a bowel movement in 2-3 days. Pt is now having abdominal pain from impaction. Pt is 2 weeks post-op prostate surgery. Pt had foley cath up until 2 days ago. Pt has hx of paraplegia. Pt also reports only having small amounts of urine over the last 2 days.  124/60 HR 84 RR 18 SpO2 95% RA

## 2018-06-21 NOTE — ED Notes (Signed)
Bladder scan showed 242

## 2018-06-23 LAB — URINE CULTURE: Culture: <10000 — AB

## 2018-06-24 DIAGNOSIS — G14 Postpolio syndrome: Secondary | ICD-10-CM | POA: Diagnosis not present

## 2018-06-24 DIAGNOSIS — N401 Enlarged prostate with lower urinary tract symptoms: Secondary | ICD-10-CM | POA: Diagnosis not present

## 2018-06-24 DIAGNOSIS — F3289 Other specified depressive episodes: Secondary | ICD-10-CM | POA: Diagnosis not present

## 2018-06-24 DIAGNOSIS — R338 Other retention of urine: Secondary | ICD-10-CM | POA: Diagnosis not present

## 2018-06-24 DIAGNOSIS — J9612 Chronic respiratory failure with hypercapnia: Secondary | ICD-10-CM | POA: Diagnosis not present

## 2018-06-24 DIAGNOSIS — G250 Essential tremor: Secondary | ICD-10-CM | POA: Diagnosis not present

## 2018-06-24 DIAGNOSIS — C9100 Acute lymphoblastic leukemia not having achieved remission: Secondary | ICD-10-CM | POA: Diagnosis not present

## 2018-06-24 DIAGNOSIS — K5909 Other constipation: Secondary | ICD-10-CM | POA: Diagnosis not present

## 2018-06-28 ENCOUNTER — Other Ambulatory Visit: Payer: Self-pay

## 2018-06-28 ENCOUNTER — Encounter (HOSPITAL_COMMUNITY): Payer: Self-pay

## 2018-06-28 ENCOUNTER — Emergency Department (HOSPITAL_COMMUNITY): Payer: Medicare Other

## 2018-06-28 ENCOUNTER — Inpatient Hospital Stay (HOSPITAL_COMMUNITY)
Admission: EM | Admit: 2018-06-28 | Discharge: 2018-07-02 | DRG: 698 | Disposition: A | Discharge status: Home Health | Payer: Medicare Other | Attending: Internal Medicine | Admitting: Internal Medicine

## 2018-06-28 DIAGNOSIS — I10 Essential (primary) hypertension: Secondary | ICD-10-CM | POA: Diagnosis not present

## 2018-06-28 DIAGNOSIS — J189 Pneumonia, unspecified organism: Secondary | ICD-10-CM | POA: Diagnosis not present

## 2018-06-28 DIAGNOSIS — J9622 Acute and chronic respiratory failure with hypercapnia: Secondary | ICD-10-CM | POA: Diagnosis present

## 2018-06-28 DIAGNOSIS — C9110 Chronic lymphocytic leukemia of B-cell type not having achieved remission: Secondary | ICD-10-CM | POA: Diagnosis not present

## 2018-06-28 DIAGNOSIS — J9621 Acute and chronic respiratory failure with hypoxia: Secondary | ICD-10-CM | POA: Diagnosis not present

## 2018-06-28 DIAGNOSIS — J9602 Acute respiratory failure with hypercapnia: Secondary | ICD-10-CM

## 2018-06-28 DIAGNOSIS — G14 Postpolio syndrome: Secondary | ICD-10-CM

## 2018-06-28 DIAGNOSIS — R0902 Hypoxemia: Secondary | ICD-10-CM | POA: Diagnosis not present

## 2018-06-28 DIAGNOSIS — R0689 Other abnormalities of breathing: Secondary | ICD-10-CM | POA: Diagnosis not present

## 2018-06-28 DIAGNOSIS — T83511A Infection and inflammatory reaction due to indwelling urethral catheter, initial encounter: Secondary | ICD-10-CM | POA: Diagnosis not present

## 2018-06-28 DIAGNOSIS — R000 Tachycardia, unspecified: Secondary | ICD-10-CM | POA: Diagnosis not present

## 2018-06-28 DIAGNOSIS — R0602 Shortness of breath: Secondary | ICD-10-CM | POA: Diagnosis not present

## 2018-06-28 DIAGNOSIS — A419 Sepsis, unspecified organism: Secondary | ICD-10-CM | POA: Diagnosis present

## 2018-06-28 DIAGNOSIS — N400 Enlarged prostate without lower urinary tract symptoms: Secondary | ICD-10-CM | POA: Diagnosis present

## 2018-06-28 DIAGNOSIS — R0601 Orthopnea: Secondary | ICD-10-CM | POA: Diagnosis not present

## 2018-06-28 LAB — BASIC METABOLIC PANEL
ANION GAP: 6 (ref 5–15)
BUN: 28 mg/dL — ABNORMAL HIGH (ref 8–23)
CO2: 29 mmol/L (ref 22–32)
Calcium: 8.7 mg/dL — ABNORMAL LOW (ref 8.9–10.3)
Chloride: 97 mmol/L — ABNORMAL LOW (ref 98–111)
Creatinine, Ser: 0.44 mg/dL — ABNORMAL LOW (ref 0.61–1.24)
GFR calc non Af Amer: >60 mL/min (ref >60)
GLUCOSE: 121 mg/dL — AB (ref 70–99)
Potassium: 4.1 mmol/L (ref 3.5–5.1)
Sodium: 132 mmol/L — ABNORMAL LOW (ref 135–145)

## 2018-06-28 LAB — POCT I-STAT EG7
Acid-Base Excess: 3 mmol/L — ABNORMAL HIGH (ref 0.0–2.0)
Bicarbonate: 30.3 mmol/L — ABNORMAL HIGH (ref 20.0–28.0)
Calcium, Ion: 1.19 mmol/L (ref 1.15–1.40)
HCT: 34 % — ABNORMAL LOW (ref 39.0–52.0)
Hemoglobin: 11.6 g/dL — ABNORMAL LOW (ref 13.0–17.0)
O2 Saturation: 56 %
Potassium: 4.2 mmol/L (ref 3.5–5.1)
Sodium: 133 mmol/L — ABNORMAL LOW (ref 135–145)
TCO2: 32 mmol/L (ref 22–32)
pCO2, Ven: 61.6 mmHg — ABNORMAL HIGH (ref 44.0–60.0)
pH, Ven: 7.3 (ref 7.250–7.430)
pO2, Ven: 34 mmHg (ref 32.0–45.0)

## 2018-06-28 LAB — CBC WITH DIFFERENTIAL/PLATELET
Abs Immature Granulocytes: 0 10*3/uL (ref 0.00–0.07)
Basophils Absolute: 0 10*3/uL (ref 0.0–0.1)
Basophils Relative: 0 %
Eosinophils Absolute: 0 10*3/uL (ref 0.0–0.5)
Eosinophils Relative: 0 %
HCT: 35.6 % — ABNORMAL LOW (ref 39.0–52.0)
Hemoglobin: 11.1 g/dL — ABNORMAL LOW (ref 13.0–17.0)
LYMPHS PCT: 30 %
Lymphs Abs: 7.9 10*3/uL — ABNORMAL HIGH (ref 0.7–4.0)
MCH: 31.5 pg (ref 26.0–34.0)
MCHC: 31.2 g/dL (ref 30.0–36.0)
MCV: 101.1 fL — ABNORMAL HIGH (ref 80.0–100.0)
Monocytes Absolute: 0 10*3/uL — ABNORMAL LOW (ref 0.1–1.0)
Monocytes Relative: 0 %
Neutro Abs: 18.4 10*3/uL — ABNORMAL HIGH (ref 1.7–7.7)
Neutrophils Relative %: 70 %
Platelets: 436 10*3/uL — ABNORMAL HIGH (ref 150–400)
RBC: 3.52 MIL/uL — ABNORMAL LOW (ref 4.22–5.81)
RDW: 14.6 % (ref 11.5–15.5)
WBC: 26.3 10*3/uL — AB (ref 4.0–10.5)
nRBC: 0 % (ref 0.0–0.2)

## 2018-06-28 LAB — I-STAT CREATININE, ED: Creatinine, Ser: 0.6 mg/dL — ABNORMAL LOW (ref 0.61–1.24)

## 2018-06-28 LAB — CBG MONITORING, ED: GLUCOSE-CAPILLARY: 99 mg/dL (ref 70–99)

## 2018-06-28 NOTE — ED Notes (Signed)
XR at bedside

## 2018-06-28 NOTE — ED Notes (Signed)
Nonrebreather removed from pt and placed on 6L nasal cannula.

## 2018-06-28 NOTE — ED Provider Notes (Signed)
[facility] DEPT Provider Note   CSN: [id number] Arrival date & time: 06/28/18  2020    History   Chief Complaint Chief Complaint  Patient presents with  . Shortness of Breath    HPI Manuel Reeves is a 85 y.o. male.     HPI   He presents for evaluation of shortness of breath starting today, with decreased urinary output, following removal of urinary catheter.  Patient recently had a TURP, about 2 and half weeks ago and was in the ED, 1 week ago with urinary retention associated with hematuria.  He does not use oxygen at home.  He presents for evaluation by EMS, on high flow oxygen by mask.  Reported O2 sat in the field on room air 50%.  No recent fever, chills, cough.  Patient has had similar problems previously when he was in urinary retention.  There is been no recent fever, chills, dysuria or vomiting.  There are no other known modifying factors.  Past Medical History:  Diagnosis Date  . Anxiety   . CLL (chronic lymphocytic leukemia) ([place]) 02/20/2014  . Osteoporosis   . Pneumonia   . Polio   . Scoliosis     Patient Active Problem List   Diagnosis Date Noted  . BPH with urinary obstruction 06/10/2018  . Acute and chronic respiratory failure with hypercapnia ([place]) 05/23/2018  . Right hip pain 05/23/2018  . History of ETT   . Acute respiratory failure with hypoxemia ([place])   . Goals of care, counseling/discussion   . Bronchitis with bronchospasm   . HCAP (healthcare-associated pneumonia) 05/03/2018  . Hyponatremia 05/03/2018  . BPH (benign prostatic hyperplasia) 04/22/2018  . Depression with anxiety 04/22/2018  . Cough 04/22/2018  . Aspiration pneumonia ([place]) 04/22/2018  . Leukocytosis 02/25/2018  . 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 Acute kidney failure ([place]) 02/24/2018  . Constipation 02/24/2018  . Hyperkalemia, diminished renal excretion 02/24/2018  . Delirium 02/24/2018  . Acute bilateral obstructive uropathy 02/24/2018  .  CLL (chronic lymphocytic leukemia) ([place]) 02/20/2014    Past Surgical History:  Procedure Laterality Date  . CATARACT EXTRACTION  09/2011   at [facility]  . COLONOSCOPY  03/2007  . RETINAL DETACHMENT SURGERY  09/1995  . SPINAL FUSION  10/1952  . TONSILLECTOMY    . TRANSURETHRAL RESECTION OF PROSTATE N/A 06/10/2018   Procedure: TRANSURETHRAL RESECTION OF THE PROSTATE (TURP);  Surgeon: Kathie Rhodes, MD;  Location: [facility] ORS;  Service: Urology;  Laterality: N/A;        Home Medications    Prior to Admission medications   Medication Sig Start Date End Date Taking? Authorizing Provider  albuterol (PROVENTIL) (2.5 MG/3ML) 0.083% nebulizer solution Take 3 mLs (2.5 mg total) by nebulization every 6 (six) hours as needed for wheezing or shortness of breath. Patient taking differently: Take 2.5 mg by nebulization 2 (two) times daily.  05/09/18  Yes Patrecia Pour, MD  bisacodyl (DULCOLAX) 5 MG EC tablet Take 2 tablets (10 mg total) by mouth daily. Patient taking differently: Take 10 mg by mouth daily as needed (constipation).  05/10/18  Yes Patrecia Pour, MD  buPROPion (WELLBUTRIN SR) 150 MG 12 hr tablet Take 150 mg by mouth daily.  02/24/12  Yes [provider]  Calcium Carbonate-Vitamin D (CALTRATE 600+D PO) Take 1 tablet by mouth daily.   Yes [provider]  ergocalciferol (VITAMIN D2) 50000 units capsule Take 50,000 Units by mouth every 14 (fourteen) days. On Monday   Yes [provider]  LORazepam (ATIVAN) 0.5 MG tablet Take 1 mg by mouth at bedtime.    Yes [provider]  Multiple Vitamins-Minerals (OCUVITE PO) Take 1 tablet by mouth daily.   Yes [provider]  multivitamin-iron-minerals-folic acid (CENTRUM) chewable tablet Chew 1 tablet by mouth daily.   Yes [provider]  phenazopyridine (PYRIDIUM) 200 MG tablet Take 1 tablet (200 mg total) by mouth 3 (three) times daily as needed for pain. 06/10/18  Yes Kathie Rhodes, MD  sertraline  (ZOLOFT) 50 MG tablet Take 100 mg by mouth daily.    Yes [provider]  tamsulosin (FLOMAX) 0.4 MG CAPS capsule Take 1 capsule (0.4 mg total) by mouth daily. Patient taking differently: Take 0.8 mg by mouth every evening.  02/28/18  Yes Shelly Coss, MD  ciprofloxacin (CIPRO) 500 MG tablet Take 1 tablet (500 mg total) by mouth 2 (two) times daily. Patient not taking: Reported on 06/28/2018 06/10/18   Kathie Rhodes, MD  HYDROcodone-acetaminophen [facility]) 10-325 MG tablet Take 1-2 tablets by mouth every 4 (four) hours as needed for moderate pain. Maximum dose per 24 hours - 8 pills Patient not taking: Reported on 06/28/2018 06/10/18   Kathie Rhodes, MD    Family History Family History  Problem Relation Age of Onset  . Colon cancer Sister 1    Social History Social History   Tobacco Use  . Smoking status: Former Research scientist (life sciences)  . Smokeless tobacco: Never Used  . Tobacco comment: Quit at age 51  Substance Use Topics  . Alcohol use: Yes    Comment: occasional beer or wine manybe monthly  . Drug use: No     Allergies   Penicillins   Review of Systems Review of Systems  All other systems reviewed and are negative.    Physical Exam Updated Vital Signs BP 133/71   Pulse 98   Temp 97.9 F (36.6 C) (Rectal)   Resp (!) 24   Ht 6' (1.829 m)   Wt 81 kg   SpO2 93%   BMI 24.22 kg/m   Physical Exam Vitals signs and nursing note reviewed.  Constitutional:      General: He is in acute distress (Uncomfortable).     Appearance: He is well-developed. He is ill-appearing. He is not toxic-appearing or diaphoretic.  HENT:     Head: Normocephalic and atraumatic.     Right Ear: External ear normal.     Left Ear: External ear normal.     Nose: Nose normal. No congestion or rhinorrhea.     Mouth/Throat:     Mouth: Mucous membranes are moist.     Pharynx: No oropharyngeal exudate.  Eyes:     Conjunctiva/sclera: Conjunctivae normal.     Pupils: Pupils are equal, round, and  reactive to light.  Neck:     Musculoskeletal: Normal range of motion and neck supple.     Trachea: Phonation normal.  Cardiovascular:     Rate and Rhythm: Normal rate and regular rhythm.     Heart sounds: Normal heart sounds.  Pulmonary:     Effort: Pulmonary effort is normal. No respiratory distress.     Breath sounds: Normal breath sounds. No stridor. No wheezing or rhonchi.  Abdominal:     Palpations: Abdomen is soft.     Tenderness: There is no abdominal tenderness.  Musculoskeletal:     Comments: Scoliosis.  Weak legs with atrophy, secondary to paraplegia  Skin:    General: Skin is warm and dry.  Coloration: Skin is not jaundiced or pale.  Neurological:     Mental Status: He is alert and oriented to person, place, and time.     Cranial Nerves: No cranial nerve deficit.     Sensory: No sensory deficit.     Motor: Weakness (Legs) present. No abnormal muscle tone.     Coordination: Coordination normal.  Psychiatric:        Mood and Affect: Mood normal.        Behavior: Behavior normal.      ED Treatments / Results  Labs (all labs ordered are listed, but only abnormal results are displayed) Labs Reviewed  BASIC METABOLIC PANEL - Abnormal; Notable for the following components:      Result Value   Sodium 132 (*)    Chloride 97 (*)    Glucose, Bld 121 (*)    BUN 28 (*)    Creatinine, Ser 0.44 (*)    Calcium 8.7 (*)    All other components within normal limits  CBC WITH DIFFERENTIAL/PLATELET - Abnormal; Notable for the following components:   WBC 26.3 (*)    RBC 3.52 (*)    Hemoglobin 11.1 (*)    HCT 35.6 (*)    MCV 101.1 (*)    Platelets 436 (*)    Neutro Abs 18.4 (*)    Lymphs Abs 7.9 (*)    Monocytes Absolute 0.0 (*)    All other components within normal limits  I-STAT CREATININE, ED - Abnormal; Notable for the following components:   Creatinine, Ser 0.60 (*)    All other components within normal limits  POCT I-STAT EG7 - Abnormal; Notable for the  following components:   pCO2, Ven 61.6 (*)    Bicarbonate 30.3 (*)    Acid-Base Excess 3.0 (*)    Sodium 133 (*)    HCT 34.0 (*)    Hemoglobin 11.6 (*)    All other components within normal limits  URINALYSIS, ROUTINE W REFLEX MICROSCOPIC  LACTIC ACID, PLASMA  LACTIC ACID, PLASMA  CBG MONITORING, ED    EKG EKG Interpretation  Date/Time:  Tuesday June 28 2018 20:34:12 EST Ventricular Rate:  107 PR Interval:    QRS Duration: 105 QT Interval:  331 QTC Calculation: 442 R Axis:   16 Text Interpretation:  Sinus tachycardia Probable left atrial enlargement Low voltage, extremity leads since last tracing no significant change Confirmed by Daleen Bo [phone]) on 06/28/2018 11:34:38 PM   Radiology Dg Chest Port 1 View  Result Date: 06/28/2018 CLINICAL DATA:  85 year old male with shortness of breath and weakness acute onset today. EXAM: PORTABLE CHEST 1 VIEW COMPARISON:  Chest radiographs 05/23/2018 and earlier. FINDINGS: Portable AP semi upright view at 2153 hours. Chronic severe scoliosis with associated bony deformity of the chest. Mediastinal contours and ventilation appears stable since last month. There is chronic veiling opacity at both lung bases along with dense retrocardiac opacity. No pneumothorax or new pulmonary opacity. Visualized tracheal air column is within normal limits. IMPRESSION: 1. Ventilation appears stable since last month. Probable chronic pleural effusions and lower lobe collapse. 2. Extensive scoliosis and bony deformity of the chest. Electronically Signed   By: Genevie Ann M.D.   On: 06/28/2018 22:35    Procedures .Critical Care Performed by: Daleen Bo, MD Authorized by: Daleen Bo, MD   Critical care provider statement:    Critical care time (minutes):  35   Critical care start time:  06/28/2018 9:50 PM   Critical care end time:  06/29/2018 12:11 AM   Critical care time was exclusive of:  Separately billable procedures and treating other  patients   Critical care was necessary to treat or prevent imminent or life-threatening deterioration of the following conditions:  Respiratory failure   Critical care was time spent personally by me on the following activities:  Blood draw for specimens, development of treatment plan with patient or surrogate, discussions with consultants, evaluation of patient's response to treatment, examination of patient, obtaining history from patient or surrogate, ordering and performing treatments and interventions, ordering and review of laboratory studies, pulse oximetry, re-evaluation of patient's condition, review of old charts and ordering and review of radiographic studies   (including critical care time)  Medications Ordered in ED Medications  sodium chloride (PF) 0.9 % injection (has no administration in time range)  iopamidol (ISOVUE-370) 76 % injection (has no administration in time range)     Initial Impression / Assessment and Plan / ED Course  I have reviewed the triage vital signs and the nursing notes.  Pertinent labs & imaging results that were available during my care of the patient were reviewed by me and considered in my medical decision making (see chart for details).  Clinical Course as of Jun 29 17  Tue Jun 28, 2018  2330 Normal  CBG monitoring, ED [EW]  2330 Somewhat low  I-stat Creatinine, ED(!) [EW]  2330 Elevated white count, hemoglobin low  CBC with Differential(!) [EW]  2330 Normal except PCO2 elevated, bicarb elevated, sodium low, hematocrit low, hemoglobin low  POCT I-Stat EG7(!) [EW]  2330 Normal except sodium low, chloride low, glucose high, BUN high, creatinine low, calcium low  Basic metabolic panel(!) [EW]  8182 No clear infiltrate, possible bilateral lower lobe effusions.  DG Chest Port 1 View [EW]    Clinical Course User Index [EW] Daleen Bo, MD        Patient Vitals for the past 24 hrs:  BP Temp Temp src Pulse Resp SpO2 Height Weight   06/28/18 2356 - 97.9 F (36.6 C) Rectal - - - - -  06/28/18 2258 133/71 - - 98 (!) 24 93 % - -  06/28/18 2150 136/73 - - (!) 103 (!) 25 96 % - -  06/28/18 2032 (!) 149/76 - - (!) 107 (!) 24 96 % 6' (1.829 m) 81 kg  06/28/18 2029 - - - - - 93 % - -    11:49 PM Reevaluation with update and discussion. After initial assessment and treatment, an updated evaluation reveals patient continues to be tachypneic, with oxygen saturation 88%.  Will convert back to facemask at this time.  Patient family updated on findings and plan. Daleen Bo   Medical Decision Making: Respiratory distress with hypoxia, and chest x-ray did not indicate pneumonia.  Elevated white count, nonspecific.  Patient with improvement of oxygenation using oxygen by nasal cannula and facemask.  CT imaging required to evaluate for specific source of hypoxia.  Suspect pulmonary embolus likely related to pre-existing paraplegia.  Last episode of hypercapnic respiratory failure, 05/04/2018.  He is on home BiPAP.  He will require admission for monitoring stabilization following ED evaluation.  Rectal temp normal.  Doubt pneumonia.  Urinalysis ordered to evaluate for UTI.  Patient had recent TURP.  Doubt complication from that procedure at this time.  CRITICAL CARE- yes Performed by: Daleen Bo   Nursing Notes Reviewed/ Care Coordinated Applicable Imaging Reviewed Interpretation of Laboratory Data incorporated into ED treatment  Plan-disposition per oncoming team following return  of CT imaging.    Final Clinical Impressions(s) / ED Diagnoses   Final diagnoses:  Hypoxia  Acute hypercapnic respiratory failure [facility])    ED Discharge Orders    None       Daleen Bo, MD 06/29/18 [phone]

## 2018-06-28 NOTE — ED Triage Notes (Addendum)
Per ems: pt coming from [facility] independent living c/o shortness of breath and weakness that started 2 hours ago. Hx of pneumonia a month ago. Lung sounds on expiration rhonchi, clear on inhalation. Pt was found at 50% oxygen upon arrival. Put on nonbreather 15L   Nonambulatory, wheelchair only

## 2018-06-28 NOTE — ED Notes (Signed)
Pt bladder scanned by Jacob Moores. Reports scanner said 177 mL

## 2018-06-28 NOTE — ED Notes (Signed)
Bed: WA08 Expected date:  Expected time:  Means of arrival:  Comments: [facility] 

## 2018-06-29 ENCOUNTER — Emergency Department (HOSPITAL_COMMUNITY): Payer: Medicare Other

## 2018-06-29 ENCOUNTER — Inpatient Hospital Stay (HOSPITAL_COMMUNITY): Payer: Medicare Other

## 2018-06-29 ENCOUNTER — Encounter (HOSPITAL_COMMUNITY): Payer: Self-pay

## 2018-06-29 DIAGNOSIS — T83511A Infection and inflammatory reaction due to indwelling urethral catheter, initial encounter: Secondary | ICD-10-CM | POA: Diagnosis present

## 2018-06-29 DIAGNOSIS — R1310 Dysphagia, unspecified: Secondary | ICD-10-CM | POA: Diagnosis present

## 2018-06-29 DIAGNOSIS — F419 Anxiety disorder, unspecified: Secondary | ICD-10-CM | POA: Diagnosis present

## 2018-06-29 DIAGNOSIS — Z993 Dependence on wheelchair: Secondary | ICD-10-CM | POA: Diagnosis not present

## 2018-06-29 DIAGNOSIS — Y95 Nosocomial condition: Secondary | ICD-10-CM | POA: Diagnosis present

## 2018-06-29 DIAGNOSIS — G14 Postpolio syndrome: Secondary | ICD-10-CM | POA: Diagnosis present

## 2018-06-29 DIAGNOSIS — A419 Sepsis, unspecified organism: Secondary | ICD-10-CM | POA: Diagnosis present

## 2018-06-29 DIAGNOSIS — Z87891 Personal history of nicotine dependence: Secondary | ICD-10-CM | POA: Diagnosis not present

## 2018-06-29 DIAGNOSIS — N401 Enlarged prostate with lower urinary tract symptoms: Secondary | ICD-10-CM | POA: Diagnosis present

## 2018-06-29 DIAGNOSIS — J189 Pneumonia, unspecified organism: Secondary | ICD-10-CM | POA: Diagnosis present

## 2018-06-29 DIAGNOSIS — Z800 Family history of malignant neoplasm of digestive organs: Secondary | ICD-10-CM | POA: Diagnosis not present

## 2018-06-29 DIAGNOSIS — M810 Age-related osteoporosis without current pathological fracture: Secondary | ICD-10-CM | POA: Diagnosis present

## 2018-06-29 DIAGNOSIS — N390 Urinary tract infection, site not specified: Secondary | ICD-10-CM | POA: Diagnosis present

## 2018-06-29 DIAGNOSIS — J9622 Acute and chronic respiratory failure with hypercapnia: Secondary | ICD-10-CM

## 2018-06-29 DIAGNOSIS — R918 Other nonspecific abnormal finding of lung field: Secondary | ICD-10-CM | POA: Diagnosis not present

## 2018-06-29 DIAGNOSIS — M419 Scoliosis, unspecified: Secondary | ICD-10-CM | POA: Diagnosis present

## 2018-06-29 DIAGNOSIS — G8220 Paraplegia, unspecified: Secondary | ICD-10-CM | POA: Diagnosis present

## 2018-06-29 DIAGNOSIS — R0902 Hypoxemia: Secondary | ICD-10-CM | POA: Diagnosis not present

## 2018-06-29 DIAGNOSIS — Z880 Allergy status to penicillin: Secondary | ICD-10-CM | POA: Diagnosis not present

## 2018-06-29 DIAGNOSIS — Z981 Arthrodesis status: Secondary | ICD-10-CM | POA: Diagnosis not present

## 2018-06-29 DIAGNOSIS — I5032 Chronic diastolic (congestive) heart failure: Secondary | ICD-10-CM | POA: Diagnosis present

## 2018-06-29 DIAGNOSIS — C9110 Chronic lymphocytic leukemia of B-cell type not having achieved remission: Secondary | ICD-10-CM | POA: Diagnosis present

## 2018-06-29 DIAGNOSIS — R339 Retention of urine, unspecified: Secondary | ICD-10-CM | POA: Diagnosis present

## 2018-06-29 DIAGNOSIS — F329 Major depressive disorder, single episode, unspecified: Secondary | ICD-10-CM | POA: Diagnosis present

## 2018-06-29 DIAGNOSIS — J9621 Acute and chronic respiratory failure with hypoxia: Secondary | ICD-10-CM | POA: Diagnosis present

## 2018-06-29 DIAGNOSIS — R0602 Shortness of breath: Secondary | ICD-10-CM | POA: Diagnosis not present

## 2018-06-29 DIAGNOSIS — J811 Chronic pulmonary edema: Secondary | ICD-10-CM | POA: Diagnosis not present

## 2018-06-29 LAB — URINALYSIS, ROUTINE W REFLEX MICROSCOPIC
Bacteria, UA: NONE SEEN
Bilirubin Urine: NEGATIVE
Glucose, UA: NEGATIVE mg/dL
Ketones, ur: 5 mg/dL — AB
Nitrite: NEGATIVE
Protein, ur: 100 mg/dL — AB
RBC / HPF: >50 RBC/hpf — ABNORMAL HIGH (ref 0–5)
Specific Gravity, Urine: 1.019 (ref 1.005–1.030)
WBC, UA: >50 WBC/hpf — ABNORMAL HIGH (ref 0–5)
pH: 6 (ref 5.0–8.0)

## 2018-06-29 LAB — MRSA PCR SCREENING: MRSA by PCR: NEGATIVE

## 2018-06-29 LAB — LACTIC ACID, PLASMA: Lactic Acid, Venous: 0.8 mmol/L (ref 0.5–1.9)

## 2018-06-29 MED ORDER — ACETAMINOPHEN 325 MG PO TABS: 650.0000 mg | ORAL_TABLET | Freq: Four times a day (QID) | ORAL | Status: DC | PRN

## 2018-06-29 MED ORDER — ALBUTEROL SULFATE (2.5 MG/3ML) 0.083% IN NEBU
2.5000 mg | INHALATION_SOLUTION | Freq: Four times a day (QID) | RESPIRATORY_TRACT | Status: DC | PRN
  Administered 2018-06-29: 2.5 mg via RESPIRATORY_TRACT
  Filled 2018-06-29: qty 3

## 2018-06-29 MED ORDER — BUPROPION HCL ER (SR) 150 MG PO TB12
150.0000 mg | ORAL_TABLET | Freq: Every day | ORAL | Status: DC
  Administered 2018-06-30 – 2018-07-02 (×3): 150 mg via ORAL
  Filled 2018-06-29 (×4): qty 1

## 2018-06-29 MED ORDER — ENOXAPARIN SODIUM 40 MG/0.4ML ~~LOC~~ SOLN
40.0000 mg | SUBCUTANEOUS | Status: DC
  Administered 2018-06-29 – 2018-07-01 (×3): 40 mg via SUBCUTANEOUS
  Filled 2018-06-29 (×3): qty 0.4

## 2018-06-29 MED ORDER — SODIUM CHLORIDE 0.9 % IV SOLN
1.0000 g | Freq: Three times a day (TID) | INTRAVENOUS | Status: DC
  Administered 2018-06-29 – 2018-07-02 (×8): 1 g via INTRAVENOUS
  Filled 2018-06-29 (×8): qty 1

## 2018-06-29 MED ORDER — ACETAMINOPHEN 650 MG RE SUPP: 650.0000 mg | Freq: Four times a day (QID) | RECTAL | Status: DC | PRN

## 2018-06-29 MED ORDER — METRONIDAZOLE IN NACL 5-0.79 MG/ML-% IV SOLN
500.0000 mg | Freq: Three times a day (TID) | INTRAVENOUS | Status: DC
  Administered 2018-06-29 – 2018-06-30 (×5): 500 mg via INTRAVENOUS
  Filled 2018-06-29 (×5): qty 100

## 2018-06-29 MED ORDER — TAMSULOSIN HCL 0.4 MG PO CAPS
0.8000 mg | ORAL_CAPSULE | Freq: Every evening | ORAL | Status: DC
  Administered 2018-06-30 – 2018-07-01 (×2): 0.8 mg via ORAL
  Filled 2018-06-29 (×2): qty 2

## 2018-06-29 MED ORDER — SODIUM CHLORIDE (PF) 0.9 % IJ SOLN
INTRAMUSCULAR | Status: AC
  Filled 2018-06-29: qty 50

## 2018-06-29 MED ORDER — CHLORHEXIDINE GLUCONATE 0.12 % MT SOLN
15.0000 mL | Freq: Two times a day (BID) | OROMUCOSAL | Status: DC
  Administered 2018-06-29 – 2018-07-02 (×5): 15 mL via OROMUCOSAL
  Filled 2018-06-29 (×6): qty 15

## 2018-06-29 MED ORDER — BISACODYL 5 MG PO TBEC
10.0000 mg | DELAYED_RELEASE_TABLET | Freq: Every day | ORAL | Status: DC
  Administered 2018-07-01 – 2018-07-02 (×2): 10 mg via ORAL
  Filled 2018-06-29 (×2): qty 2

## 2018-06-29 MED ORDER — FUROSEMIDE 10 MG/ML IJ SOLN
60.0000 mg | Freq: Two times a day (BID) | INTRAMUSCULAR | Status: AC
  Administered 2018-06-29: 60 mg via INTRAVENOUS
  Filled 2018-06-29: qty 6

## 2018-06-29 MED ORDER — ONDANSETRON HCL 4 MG PO TABS: 4.0000 mg | ORAL_TABLET | Freq: Four times a day (QID) | ORAL | Status: DC | PRN

## 2018-06-29 MED ORDER — SODIUM CHLORIDE 0.9 % IV SOLN
2.0000 g | Freq: Once | INTRAVENOUS | Status: AC
  Administered 2018-06-29: 2 g via INTRAVENOUS
  Filled 2018-06-29: qty 2

## 2018-06-29 MED ORDER — FUROSEMIDE 10 MG/ML IJ SOLN
60.0000 mg | Freq: Once | INTRAMUSCULAR | Status: AC
  Administered 2018-06-29: 60 mg via INTRAVENOUS
  Filled 2018-06-29: qty 8

## 2018-06-29 MED ORDER — METRONIDAZOLE IVPB CUSTOM
250.0000 mg | Freq: Three times a day (TID) | INTRAVENOUS | Status: DC
  Filled 2018-06-29: qty 50

## 2018-06-29 MED ORDER — ONDANSETRON HCL 4 MG/2ML IJ SOLN: 4.0000 mg | Freq: Four times a day (QID) | INTRAMUSCULAR | Status: DC | PRN

## 2018-06-29 MED ORDER — SODIUM CHLORIDE 0.9 % IV SOLN
INTRAVENOUS | Status: DC
  Administered 2018-06-29: 10:00:00 via INTRAVENOUS

## 2018-06-29 MED ORDER — IOPAMIDOL (ISOVUE-370) INJECTION 76%
100.0000 mL | Freq: Once | INTRAVENOUS | Status: AC | PRN
  Administered 2018-06-29: 100 mL via INTRAVENOUS

## 2018-06-29 MED ORDER — ORAL CARE MOUTH RINSE
15.0000 mL | Freq: Two times a day (BID) | OROMUCOSAL | Status: DC
  Administered 2018-06-30 – 2018-07-01 (×4): 15 mL via OROMUCOSAL

## 2018-06-29 MED ORDER — SERTRALINE HCL 100 MG PO TABS
100.0000 mg | ORAL_TABLET | Freq: Every day | ORAL | Status: DC
  Administered 2018-06-30 – 2018-07-02 (×3): 100 mg via ORAL
  Filled 2018-06-29 (×3): qty 1
  Filled 2018-06-29: qty 2

## 2018-06-29 MED ORDER — LORAZEPAM 0.5 MG PO TABS
0.5000 mg | ORAL_TABLET | Freq: Every evening | ORAL | Status: DC | PRN
  Administered 2018-06-30 – 2018-07-01 (×2): 0.5 mg via ORAL
  Filled 2018-06-29 (×2): qty 1

## 2018-06-29 MED ORDER — LORAZEPAM 1 MG PO TABS
1.0000 mg | ORAL_TABLET | Freq: Every evening | ORAL | Status: DC | PRN
  Administered 2018-06-29: 1 mg via ORAL
  Filled 2018-06-29 (×2): qty 1

## 2018-06-29 MED ORDER — IOPAMIDOL (ISOVUE-370) INJECTION 76%
INTRAVENOUS | Status: AC
  Filled 2018-06-29: qty 100

## 2018-06-29 NOTE — ED Notes (Addendum)
Pt's wife requesting updates regarding the time before they get upstairs

## 2018-06-29 NOTE — ED Notes (Addendum)
Patient transported to CT 

## 2018-06-29 NOTE — H&P (Addendum)
History and Physical    Manuel Reeves BDZ:[id number] DOB: October 24, 1932 DOA: 06/28/2018  Referring MD/NP/PA: EDP PCP:  Patient coming from: Home  Chief Complaint: weakness  HPI: Manuel Reeves is a 85 y.o. male with medical history significant for CLL, post polio syndrome, wheelchair-bound at baseline, BPH just underwent TURP on 1/31, postop course was complicated by urinary retention and hematuria, subsequently went home with a Foley catheter 2 weeks ago, was seen by urology Dr. Karsten Ro in follow-up and had his catheter removed yesterday morning. -By yesterday afternoon started having shaking chills, some weakness, shortness of breath, patient and spouse live at an independent living facility he was evaluated by a nurse there who noted that his heart rate and blood pressure were significantly high he was sent to the emergency room to be evaluated. -Patient denies any cough congestion or shortness of breath, just had a single episode of dyspnea yesterday afternoon.  He also denies urinary retention abdominal pain nausea vomiting diarrhea chest pain ED Course: He was found to be profoundly hypoxic requiring nonrebreather mask initially, subsequently weaned down to 6 L O2, labs in the ED were notable for white count of 26,000, Urine was cloudy with large RBC, numerous white blood cells, positive leukocyte esterase, in addition CT chest was done which was negative for pulmonary embolism or pneumonia, debris within trachea, ? Aspirated material, bibasilar atelectasis.  Review of Systems: As per HPI otherwise 14 point review of systems negative.   Past Medical History:  Diagnosis Date  . Anxiety   . CLL (chronic lymphocytic leukemia) ([place]) 02/20/2014  . Osteoporosis   . Pneumonia   . Polio   . Scoliosis     Past Surgical History:  Procedure Laterality Date  . CATARACT EXTRACTION  09/2011   at [facility]  . COLONOSCOPY  03/2007  . RETINAL DETACHMENT SURGERY  09/1995  . SPINAL FUSION   10/1952  . TONSILLECTOMY    . TRANSURETHRAL RESECTION OF PROSTATE N/A 06/10/2018   Procedure: TRANSURETHRAL RESECTION OF THE PROSTATE (TURP);  Surgeon: Kathie Rhodes, MD;  Location: [facility];  Service: Urology;  Laterality: N/A;     reports that he has quit smoking. He has never used smokeless tobacco. He reports current alcohol use. He reports that he does not use drugs.  Allergies  Allergen Reactions  . Penicillins Rash    Has patient had a PCN reaction causing immediate rash, facial/tongue/throat swelling, SOB or lightheadedness with hypotension: Y Has patient had a PCN reaction causing severe rash involving mucus membranes or skin necrosis: N Has patient had a PCN reaction that required hospitalization: N Has patient had a PCN reaction occurring within the last 10 years: N If all of the above answers are "NO", then may proceed with Cephalosporin use.       Family History  Problem Relation Age of Onset  . Colon cancer Sister 32     Prior to Admission medications   Medication Sig Start Date End Date Taking? Authorizing Provider  albuterol (PROVENTIL) (2.5 MG/3ML) 0.083% nebulizer solution Take 3 mLs (2.5 mg total) by nebulization every 6 (six) hours as needed for wheezing or shortness of breath. Patient taking differently: Take 2.5 mg by nebulization 2 (two) times daily.  05/09/18  Yes Patrecia Pour, MD  bisacodyl (DULCOLAX) 5 MG EC tablet Take 2 tablets (10 mg total) by mouth daily. Patient taking differently: Take 10 mg by mouth daily as needed (constipation).  05/10/18  Yes Patrecia Pour, MD  buPROPion (  WELLBUTRIN SR) 150 MG 12 hr tablet Take 150 mg by mouth daily.  02/24/12  Yes [provider]  Calcium Carbonate-Vitamin D (CALTRATE 600+D PO) Take 1 tablet by mouth daily.   Yes [provider]  ergocalciferol (VITAMIN D2) 50000 units capsule Take 50,000 Units by mouth every 14 (fourteen) days. On Monday   Yes [provider]  LORazepam (ATIVAN) 0.5 MG  tablet Take 1 mg by mouth at bedtime.    Yes [provider]  Multiple Vitamins-Minerals (OCUVITE PO) Take 1 tablet by mouth daily.   Yes [provider]  multivitamin-iron-minerals-folic acid (CENTRUM) chewable tablet Chew 1 tablet by mouth daily.   Yes [provider]  phenazopyridine (PYRIDIUM) 200 MG tablet Take 1 tablet (200 mg total) by mouth 3 (three) times daily as needed for pain. 06/10/18  Yes Kathie Rhodes, MD  sertraline (ZOLOFT) 50 MG tablet Take 100 mg by mouth daily.    Yes [provider]  tamsulosin (FLOMAX) 0.4 MG CAPS capsule Take 1 capsule (0.4 mg total) by mouth daily. Patient taking differently: Take 0.8 mg by mouth every evening.  02/28/18  Yes Shelly Coss, MD  ciprofloxacin (CIPRO) 500 MG tablet Take 1 tablet (500 mg total) by mouth 2 (two) times daily. Patient not taking: Reported on 06/28/2018 06/10/18   Kathie Rhodes, MD  HYDROcodone-acetaminophen [facility]) 10-325 MG tablet Take 1-2 tablets by mouth every 4 (four) hours as needed for moderate pain. Maximum dose per 24 hours - 8 pills Patient not taking: Reported on 06/28/2018 06/10/18   Kathie Rhodes, MD    Physical Exam: Vitals:   06/29/18 5465 06/29/18 0809 06/29/18 0810 06/29/18 0811  BP:      Pulse: 84 84 82 84  Resp: 18 18 (!) 24 20  Temp:      TempSrc:      SpO2: (!) 88% (!) 86% (!) 89% 95%  Weight:      Height:          Constitutional: Elderly chronically ill-appearing male laying in bed, no distress Vitals:   06/29/18 0808 06/29/18 0809 06/29/18 0810 06/29/18 0811  BP:      Pulse: 84 84 82 84  Resp: 18 18 (!) 24 20  Temp:      TempSrc:      SpO2: (!) 88% (!) 86% (!) 89% 95%  Weight:      Height:       Eyes: PERRL, lids and conjunctivae normal ENMT: Mucous membranes are moist.  Neck: normal, supple Respiratory: Few scattered rhonchi, no wheezing, no crackles noted respiratory effort. No accessory muscle use.  Cardiovascular: Regular rate and rhythm, no  murmurs / rubs / gallops Abdomen: soft, non tender, Bowel sounds positive.  Musculoskeletal: No joint deformity upper and lower extremities. Ext: Post polio syndrome, trace edema Skin: no rashes, lesions, ulcers.  Neurologic: Postpolio syndrome with lower extremity weakness, sensations intact in both legs Psychiatric: Alert oriented x3  Labs on Admission: I have personally reviewed following labs and imaging studies  CBC: Recent Labs  Lab 06/28/18 2216 06/28/18 2225  WBC 26.3*  --   NEUTROABS 18.4*  --   HGB 11.1* 11.6*  HCT 35.6* 34.0*  MCV 101.1*  --   PLT 436*  --    Basic Metabolic Panel: Recent Labs  Lab 06/28/18 2216 06/28/18 2222 06/28/18 2225  NA 132*  --  133*  K 4.1  --  4.2  CL 97*  --   --   CO2 29  --   --  GLUCOSE 121*  --   --   BUN 28*  --   --   CREATININE 0.44* 0.60*  --   CALCIUM 8.7*  --   --    GFR: Estimated Creatinine Clearance: 76.8 mL/min (A) (by C-G formula based on SCr of 0.6 mg/dL (L)). Liver Function Tests: No results for input(s): AST, ALT, ALKPHOS, BILITOT, PROT, ALBUMIN in the last 168 hours. No results for input(s): LIPASE, AMYLASE in the last 168 hours. No results for input(s): AMMONIA in the last 168 hours. Coagulation Profile: No results for input(s): INR, PROTIME in the last 168 hours. Cardiac Enzymes: No results for input(s): CKTOTAL, CKMB, CKMBINDEX, TROPONINI in the last 168 hours. BNP (last 3 results) No results for input(s): PROBNP in the last 8760 hours. HbA1C: No results for input(s): HGBA1C in the last 72 hours. CBG: Recent Labs  Lab 06/28/18 2247  GLUCAP 99   Lipid Profile: No results for input(s): CHOL, HDL, LDLCALC, TRIG, CHOLHDL, LDLDIRECT in the last 72 hours. Thyroid Function Tests: No results for input(s): TSH, T4TOTAL, FREET4, T3FREE, THYROIDAB in the last 72 hours. Anemia Panel: No results for input(s): VITAMINB12, FOLATE, FERRITIN, TIBC, IRON, RETICCTPCT in the last 72 hours. Urine analysis:      Component Value Date/Time   COLORURINE YELLOW 06/29/2018 0113   APPEARANCEUR CLOUDY (A) 06/29/2018 0113   LABSPEC 1.019 06/29/2018 0113   PHURINE 6.0 06/29/2018 0113   GLUCOSEU NEGATIVE 06/29/2018 0113   HGBUR LARGE (A) 06/29/2018 0113   BILIRUBINUR NEGATIVE 06/29/2018 0113   KETONESUR 5 (A) 06/29/2018 0113   PROTEINUR 100 (A) 06/29/2018 0113   NITRITE NEGATIVE 06/29/2018 0113   LEUKOCYTESUR LARGE (A) 06/29/2018 0113   Sepsis Labs: @LABRCNTIP (procalcitonin:4,lacticidven:4) ) Recent Results (from the past 240 hour(s))  Urine culture     Status: Abnormal   Collection Time: 06/21/18  4:36 PM  Result Value Ref Range Status   Specimen Description   Final    URINE, RANDOM Performed at [facility], [place] [address], [place], [place] [address]    Special Requests   Final    NONE Performed at [facility], [place] [address], [place], [place] [address]    Culture (A)  Final    <10,000 COLONIES/mL INSIGNIFICANT GROWTH Performed at [facility] [address], [place], [place] [address]    Report Status 06/23/2018 FINAL  Final     Radiological Exams on Admission: Ct Angio Chest Pe W/cm &/or Wo Cm  Result Date: 06/29/2018 CLINICAL DATA:  Shortness of breath and weakness EXAM: CT ANGIOGRAPHY CHEST WITH CONTRAST TECHNIQUE: Multidetector CT imaging of the chest was performed using the standard protocol during bolus administration of intravenous contrast. Multiplanar CT image reconstructions and MIPs were obtained to evaluate the vascular anatomy. CONTRAST:  19mL ISOVUE-370 IOPAMIDOL (ISOVUE-370) INJECTION 76% COMPARISON:  None. FINDINGS: Cardiovascular: --Pulmonary arteries: Contrast injection is sufficient to demonstrate satisfactory opacification of the pulmonary arteries to the segmental level. There is no pulmonary embolus. The main pulmonary artery is within normal limits for size. --Aorta: Satisfactory opacification of the thoracic aorta. No  aortic dissection or other acute aortic syndrome. Conventional 3 vessel aortic branching pattern. The aortic course and caliber are normal. There is mild aortic atherosclerosis. --Heart: Normal size. No pericardial effusion. Mediastinum/Nodes: No mediastinal, hilar or axillary lymphadenopathy. The visualized thyroid and thoracic esophageal course are unremarkable. Lungs/Pleura: There is debris within the upper trachea. There is partial right lower lobe collapse. Medial left basilar atelectasis. No pleural effusion.  Upper Abdomen: Contrast bolus timing is not optimized for evaluation of the abdominal organs. There are stones within the gallbladder. Musculoskeletal: There is severe right convex scoliosis of the thoracic spine with associated multilevel left-sided ankylosis. Review of the MIP images confirms the above findings. IMPRESSION: 1. No pulmonary embolus or acute aortic syndrome. 2. Debris within the upper trachea, likely mucus or aspirated material. Bibasilar atelectasis. Aortic Atherosclerosis (ICD10-I70.0). Electronically Signed   By: Ulyses Jarred M.D.   On: 06/29/2018 01:14   Dg Chest Port 1 View  Result Date: 06/28/2018 CLINICAL DATA:  85 year old male with shortness of breath and weakness acute onset today. EXAM: PORTABLE CHEST 1 VIEW COMPARISON:  Chest radiographs 05/23/2018 and earlier. FINDINGS: Portable AP semi upright view at 2153 hours. Chronic severe scoliosis with associated bony deformity of the chest. Mediastinal contours and ventilation appears stable since last month. There is chronic veiling opacity at both lung bases along with dense retrocardiac opacity. No pneumothorax or new pulmonary opacity. Visualized tracheal air column is within normal limits. IMPRESSION: 1. Ventilation appears stable since last month. Probable chronic pleural effusions and lower lobe collapse. 2. Extensive scoliosis and bony deformity of the chest. Electronically Signed   By: Genevie Ann M.D.   On: 06/28/2018  22:35    EKG: Independently reviewed.  Sinus rhythm, nonspecific T wave changes in lateral leads  Assessment/Plan   Sepsis -I suspect this is secondary to a urinary source, the setting of recent TURP, urinary retention requiring Foley which was removed yesterday morning, followed by onset of symptoms -Blood cultures and urine cultures -However given profound hypoxia, cannot rule out aspiration pneumonia at this time, chest showed bibasilar atelectasis, repeat x-ray in a.m. -In addition I suspect his severe scoliosis, post polio syndrome w/ restrictive lung dz is contributing to hypoxia -Start cefepime and Flagyl, cover for urinary source and aspiration pneumonia -Has tolerated cephalosporins in the past, reviewed with pharmacy -IV fluids -Further management as condition evolves  Hypoxia -Suspect this is secondary to sepsis, cannot rule out aspiration pneumonia at this time, see discussion above -restrictive lung disease due to severe scoliosis worsening hypoxia -Wean O2 as tolerated, CT angiogram negative for any acute cardiopulmonary findings -repeat chest x-ray in a.m.  History of CLL -Followed by Dr. Alen Blew currently on surveillance  History of BPH -Recent TURP, complicated by hematuria and urinary retention -Catheter removed yesterday -No evidence of urinary retention at this time, monitor -Followed by Dr. Kathie Rhodes  History of chronic respiratory failure/CO2 retention -On BiPAP nightly, suspect this is secondary to post polio syndrome and scoliosis  DVT prophylaxis: Lovenox Code Status: Full code, discussed patient to reconsider this Family Communication: Wife at bedside Disposition Plan: Home pending clinical improvement Consults called: None Admission status: Inpatient  Domenic Polite MD [facility]   06/29/2018, 8:27 AM

## 2018-06-29 NOTE — Progress Notes (Signed)
PHARMACY NOTE -  Cefepime  Pharmacy has been assisting with dosing of Cefepime for UTI vs PNA.  Dosage remains stable at 1g IV q8 hr and need for further dosage adjustment appears unlikely at present given stable renal function  Pharmacy will sign off, following peripherally for culture results or dose adjustments. Please reconsult if a change in clinical status warrants re-evaluation of dosage.  Reuel Boom, PharmD, BCPS [phone] 06/29/2018, 3:20 PM

## 2018-06-29 NOTE — ED Notes (Addendum)
Pt attempting to give urine sample

## 2018-06-29 NOTE — ED Notes (Signed)
Manuel Reeves, Agricultural consultant speaking with pt and explaining delay

## 2018-06-29 NOTE — ED Notes (Signed)
Pt wife is at bedside . Pt is comfortable, calm and breathing unlabored . Pt O2 sats 86-88% while on O2 [place] 6 lmp, Placed pt on 10 lpm via non-re breather and pt maintained O2 % >94%.

## 2018-06-29 NOTE — ED Notes (Signed)
Pt stated he was having a panic attack.  Administered PRN PO ativan 1 mg, and albuterol breathing treatment.

## 2018-06-29 NOTE — ED Provider Notes (Signed)
email  I assumed care of this patient from Dr. Eulis Foster at [facility].  Please see their note for further details of Hx, PE.  Briefly patient is a 85 y.o. male who presented with hypoxia with negative x-ray.  Requiring nonrebreather.  Awaiting CT to assess for occult pneumonia versus PE.Marland Kitchen   CTA negative for PE or pneumonia.  Patient still requiring supplemental oxygen.  Admitted to medicine for continued work-up and management.    Fatima Blank, MD 06/30/18 [phone]

## 2018-06-29 NOTE — Progress Notes (Signed)
Pharmacy Antibiotic Note  Manuel Reeves is a 85 y.o. male admitted on 06/28/2018 with shaking, chills and SOB.  Pt with recent TURP, foley cath removed yesterday.  Pharmacy has been consulted for cefepime dosing.  Plan: Cefepime 2gm IV x 1 in ED then 1gm q8h Follow renal function ,cultures and clinical course  Height: 6' (182.9 cm) Weight: 178 lb 9.2 oz (81 kg) IBW/kg (Calculated) : 77.6  Temp (24hrs), Avg:97.9 F (36.6 C), Min:97.9 F (36.6 C), Max:97.9 F (36.6 C)  Recent Labs  Lab 06/28/18 2216 06/28/18 2222 06/28/18 2358  WBC 26.3*  --   --   CREATININE 0.44* 0.60*  --   LATICACIDVEN  --   --  0.8    Estimated Creatinine Clearance: 76.8 mL/min (A) (by C-G formula based on SCr of 0.6 mg/dL (L)).    Allergies  Allergen Reactions  . Penicillins Rash    Has patient had a PCN reaction causing immediate rash, facial/tongue/throat swelling, SOB or lightheadedness with hypotension: Y Has patient had a PCN reaction causing severe rash involving mucus membranes or skin necrosis: N Has patient had a PCN reaction that required hospitalization: N Has patient had a PCN reaction occurring within the last 10 years: N If all of the above answers are "NO", then may proceed with Cephalosporin use.       Antimicrobials this admission: 2/19 cefepime >>  Dose adjustments this admission:   Microbiology results: 2/19 BCx:  2/19 UCx:   Thank you for allowing pharmacy to be a part of this patient's care.  Dolly Rias RPh 06/29/2018, 9:50 AM Pager [phone]

## 2018-06-29 NOTE — ED Notes (Signed)
Pt was not able to maintain adequate O2 saturations on the [place] (mid-upper 80's), so this RN tried an O2 mask which did not increase his O2 saturations.  Pt is now back on the non-rebreather. Saturations are slowly increasing and are in low 90's.  Will continue to monitor

## 2018-06-29 NOTE — ED Notes (Signed)
Pt placed on 6L [place] per MD verbal order to ween pt down on amount of O2.  Pt's O2 sats are now in the mid-90's.

## 2018-06-29 NOTE — ED Notes (Signed)
RN on break, Unable to get report at this time.

## 2018-06-29 NOTE — ED Notes (Addendum)
Pt's wife given recliner to make waiting for her husband more comfortable while he is in CT

## 2018-06-29 NOTE — ED Provider Notes (Signed)
Called to bedside because of breathing difficulty.  Pt noted to be in distress, increase work up breathing, diffuse ronchi.  BVM assisted ventilation and o2 sat increased.  Bipap ordered.  CXR ordered.  Attending MD notified.   Dorie Rank, MD 06/29/18 1525

## 2018-06-29 NOTE — ED Notes (Signed)
Pt's wife expressing frustration regarding how long they have been waiting to go upstairs. RN and staff tried to express the process and the delay. Wife is still frustrated saying feel ignored and they should be upstairs by now

## 2018-06-29 NOTE — ED Notes (Signed)
Pt O2 saturations decreased into the 80 placed pt on 15 via [place] and O2 saturations increased to 96 % . Dr. Tomi Bamberger and Dr. Rex Kras in room with pt, Respiratory called per MD to start pt on bipap.Dr. Broadus John contacted to  Made aware of pt increasing SOB. Dr. Broadus John gave VO to D/C IV fluids.

## 2018-06-29 NOTE — ED Notes (Signed)
ED TO INPATIENT HANDOFF REPORT  Name/Age/Gender Manuel Reeves 85 y.o. male  Code Status Code Status History    Date Active Date Inactive Code Status Order ID Comments User Context   06/10/2018 1606 06/14/2018 3790 Full Code [id number]  Kathie Rhodes, MD Inpatient   05/05/2018 1414 05/10/2018 1820 DNR [id number]  [name], NP Inpatient   05/03/2018 1633 05/05/2018 1414 Full Code [id number]  Georgette Shell, MD ED   04/22/2018 0043 04/24/2018 1618 Full Code [id number]  Ivor Costa, MD ED   02/24/2018 2224 02/27/2018 1917 Full Code [id number]  Etta Quill, DO ED    Advance Directive Documentation     Most Recent Value  Type of Advance Directive  Healthcare Power of Attorney, Living will, Out of facility DNR (pink MOST or yellow form)  Pre-existing out of facility DNR order (yellow form or pink MOST form)  Pink MOST form placed in chart (order not valid for inpatient use)  "MOST" Form in Place?  -      Home/SNF/Other Home  Chief Complaint sob  Level of Care/Admitting Diagnosis ED Disposition    ED Disposition Condition [place]: [place] [[id number]]  Level of Care: Stepdown [14]  Admit to SDU based on following criteria: Hemodynamic compromise or significant risk of instability:  Patient requiring short term acute titration and management of vasoactive drips, and invasive monitoring (i.e., CVP and Arterial line).  Diagnosis: Sepsis [facility]) [[id number]]  Admitting Physician: Domenic Polite [[id number]]  Attending Physician: Domenic Polite [[id number]]  Estimated length of stay: past midnight tomorrow  Certification:: I certify this patient will need inpatient services for at least 2 midnights  PT Class (Do Not Modify): Inpatient [101]  PT Acc Code (Do Not Modify): Private [1]       Medical History Past Medical History:  Diagnosis Date  . Anxiety   . CLL (chronic lymphocytic leukemia) ([place]) 02/20/2014  . Osteoporosis   . Pneumonia    . Polio   . Scoliosis     Allergies Allergies  Allergen Reactions  . Penicillins Rash    Has patient had a PCN reaction causing immediate rash, facial/tongue/throat swelling, SOB or lightheadedness with hypotension: Y Has patient had a PCN reaction causing severe rash involving mucus membranes or skin necrosis: N Has patient had a PCN reaction that required hospitalization: N Has patient had a PCN reaction occurring within the last 10 years: N If all of the above answers are "NO", then may proceed with Cephalosporin use.       IV Location/Drains/Wounds Patient Lines/Drains/Airways Status   Active Line/Drains/Airways    Name:   Placement date:   Placement time:   Site:   Days:   Peripheral IV 06/28/18 Left Antecubital   06/28/18    2359    Antecubital   1   Peripheral IV 06/28/18 Right Forearm   06/28/18    2359    Forearm   1   Urethral Catheter April  Coude   06/12/18    1020    Coude   17   Urethral Catheter Carley RN Straight-tip 16 Fr.   06/21/18    1636    Straight-tip   8          Labs/Imaging Results for orders placed or performed during the hospital encounter of 06/28/18 (from the past 48 hour(s))  Basic metabolic panel     Status: Abnormal   Collection Time: 06/28/18 10:16 PM  Result Value  Ref Range   Sodium 132 (L) 135 - 145 mmol/L   Potassium 4.1 3.5 - 5.1 mmol/L   Chloride 97 (L) 98 - 111 mmol/L   CO2 29 22 - 32 mmol/L   Glucose, Bld 121 (H) 70 - 99 mg/dL   BUN 28 (H) 8 - 23 mg/dL   Creatinine, Ser 0.44 (L) 0.61 - 1.24 mg/dL   Calcium 8.7 (L) 8.9 - 10.3 mg/dL   GFR calc non Af Amer >60 >60 mL/min   GFR calc Af Amer >60 >60 mL/min   Anion gap 6 5 - 15    Comment: Performed at [facility], [place] [address], [place], [place] [address]  CBC with Differential     Status: Abnormal   Collection Time: 06/28/18 10:16 PM  Result Value Ref Range   WBC 26.3 (H) 4.0 - 10.5 K/uL   RBC 3.52 (L) 4.22 - 5.81 MIL/uL   Hemoglobin 11.1 (L) 13.0 - 17.0  g/dL   HCT 35.6 (L) 39.0 - 52.0 %   MCV 101.1 (H) 80.0 - 100.0 fL   MCH 31.5 26.0 - 34.0 pg   MCHC 31.2 30.0 - 36.0 g/dL   RDW 14.6 11.5 - 15.5 %   Platelets 436 (H) 150 - 400 K/uL   nRBC 0.0 0.0 - 0.2 %   Neutrophils Relative % 70 %   Neutro Abs 18.4 (H) 1.7 - 7.7 K/uL   Lymphocytes Relative 30 %   Lymphs Abs 7.9 (H) 0.7 - 4.0 K/uL   Monocytes Relative 0 %   Monocytes Absolute 0.0 (L) 0.1 - 1.0 K/uL   Eosinophils Relative 0 %   Eosinophils Absolute 0.0 0.0 - 0.5 K/uL   Basophils Relative 0 %   Basophils Absolute 0.0 0.0 - 0.1 K/uL   Abs Immature Granulocytes 0.00 0.00 - 0.07 K/uL   Smudge Cells PRESENT     Comment: Performed at [facility], [place] [address] [place], [place] [address]  I-stat Creatinine, ED     Status: Abnormal   Collection Time: 06/28/18 10:22 PM  Result Value Ref Range   Creatinine, Ser 0.60 (L) 0.61 - 1.24 mg/dL  POCT I-Stat EG7     Status: Abnormal   Collection Time: 06/28/18 10:25 PM  Result Value Ref Range   pH, Ven 7.300 7.250 - 7.430   pCO2, Ven 61.6 (H) 44.0 - 60.0 mmHg   pO2, Ven 34.0 32.0 - 45.0 mmHg   Bicarbonate 30.3 (H) 20.0 - 28.0 mmol/L   TCO2 32 22 - 32 mmol/L   O2 Saturation 56.0 %   Acid-Base Excess 3.0 (H) 0.0 - 2.0 mmol/L   Sodium 133 (L) 135 - 145 mmol/L   Potassium 4.2 3.5 - 5.1 mmol/L   Calcium, Ion 1.19 1.15 - 1.40 mmol/L   HCT 34.0 (L) 39.0 - 52.0 %   Hemoglobin 11.6 (L) 13.0 - 17.0 g/dL   Patient temperature HIDE    Sample type VENOUS    Comment NOTIFIED PHYSICIAN   CBG monitoring, ED     Status: None   Collection Time: 06/28/18 10:47 PM  Result Value Ref Range   Glucose-Capillary 99 70 - 99 mg/dL  Lactic acid, plasma     Status: None   Collection Time: 06/28/18 11:58 PM  Result Value Ref Range   Lactic Acid, Venous 0.8 0.5 - 1.9 mmol/L    Comment: Performed at [facility], [place] [address], [place], [place] [address]  Urinalysis, Routine w reflex microscopic  Status: Abnormal    Collection Time: 06/29/18  1:13 AM  Result Value Ref Range   Color, Urine YELLOW YELLOW   APPearance CLOUDY (A) CLEAR   Specific Gravity, Urine 1.019 1.005 - 1.030   pH 6.0 5.0 - 8.0   Glucose, UA NEGATIVE NEGATIVE mg/dL   Hgb urine dipstick LARGE (A) NEGATIVE   Bilirubin Urine NEGATIVE NEGATIVE   Ketones, ur 5 (A) NEGATIVE mg/dL   Protein, ur 100 (A) NEGATIVE mg/dL   Nitrite NEGATIVE NEGATIVE   Leukocytes,Ua LARGE (A) NEGATIVE   RBC / HPF >50 (H) 0 - 5 RBC/hpf   WBC, UA >50 (H) 0 - 5 WBC/hpf   Bacteria, UA NONE SEEN NONE SEEN   WBC Clumps PRESENT    Mucus PRESENT     Comment: Performed at [facility], [place] [address], [place], [place] [address]   Ct Angio Chest Pe W/cm &/or Wo Cm  Result Date: 06/29/2018 CLINICAL DATA:  Shortness of breath and weakness EXAM: CT ANGIOGRAPHY CHEST WITH CONTRAST TECHNIQUE: Multidetector CT imaging of the chest was performed using the standard protocol during bolus administration of intravenous contrast. Multiplanar CT image reconstructions and MIPs were obtained to evaluate the vascular anatomy. CONTRAST:  149mL ISOVUE-370 IOPAMIDOL (ISOVUE-370) INJECTION 76% COMPARISON:  None. FINDINGS: Cardiovascular: --Pulmonary arteries: Contrast injection is sufficient to demonstrate satisfactory opacification of the pulmonary arteries to the segmental level. There is no pulmonary embolus. The main pulmonary artery is within normal limits for size. --Aorta: Satisfactory opacification of the thoracic aorta. No aortic dissection or other acute aortic syndrome. Conventional 3 vessel aortic branching pattern. The aortic course and caliber are normal. There is mild aortic atherosclerosis. --Heart: Normal size. No pericardial effusion. Mediastinum/Nodes: No mediastinal, hilar or axillary lymphadenopathy. The visualized thyroid and thoracic esophageal course are unremarkable. Lungs/Pleura: There is debris within the upper trachea. There is partial right lower  lobe collapse. Medial left basilar atelectasis. No pleural effusion. Upper Abdomen: Contrast bolus timing is not optimized for evaluation of the abdominal organs. There are stones within the gallbladder. Musculoskeletal: There is severe right convex scoliosis of the thoracic spine with associated multilevel left-sided ankylosis. Review of the MIP images confirms the above findings. IMPRESSION: 1. No pulmonary embolus or acute aortic syndrome. 2. Debris within the upper trachea, likely mucus or aspirated material. Bibasilar atelectasis. Aortic Atherosclerosis (ICD10-I70.0). Electronically Signed   By: Ulyses Jarred M.D.   On: 06/29/2018 01:14   Dg Chest Port 1 View  Result Date: 06/28/2018 CLINICAL DATA:  85 year old male with shortness of breath and weakness acute onset today. EXAM: PORTABLE CHEST 1 VIEW COMPARISON:  Chest radiographs 05/23/2018 and earlier. FINDINGS: Portable AP semi upright view at 2153 hours. Chronic severe scoliosis with associated bony deformity of the chest. Mediastinal contours and ventilation appears stable since last month. There is chronic veiling opacity at both lung bases along with dense retrocardiac opacity. No pneumothorax or new pulmonary opacity. Visualized tracheal air column is within normal limits. IMPRESSION: 1. Ventilation appears stable since last month. Probable chronic pleural effusions and lower lobe collapse. 2. Extensive scoliosis and bony deformity of the chest. Electronically Signed   By: Genevie Ann M.D.   On: 06/28/2018 22:35   EKG Interpretation  Date/Time:  Tuesday June 28 2018 20:34:12 EST Ventricular Rate:  107 PR Interval:    QRS Duration: 105 QT Interval:  331 QTC Calculation: 442 R Axis:   16 Text Interpretation:  Sinus tachycardia Probable left atrial enlargement Low voltage, extremity leads since  last tracing no significant change Confirmed by Daleen Bo [phone]) on 06/28/2018 11:34:38 PM   Pending Labs Unresulted Labs (From admission,  onward)    Start     Ordered   06/29/18 0827  Culture, blood (routine x 2)  BLOOD CULTURE X 2,   R     06/29/18 0826   06/29/18 0827  Culture, Urine  Once,   R     06/29/18 0826   Signed and Held  CBC  (enoxaparin (LOVENOX)    CrCl < 30 ml/min)  Once,   R    Comments:  Baseline for enoxaparin therapy IF NOT ALREADY DRAWN.  Notify MD if PLT < 100 K.    Signed and Held   Signed and Held  Creatinine, serum  (enoxaparin (LOVENOX)    CrCl < 30 ml/min)  Once,   R    Comments:  Baseline for enoxaparin therapy IF NOT ALREADY DRAWN.    Signed and Held   Signed and Held  Creatinine, serum  (enoxaparin (LOVENOX)    CrCl < 30 ml/min)  Weekly,   R    Comments:  while on enoxaparin therapy.    Signed and Held   Signed and Held  CBC  Tomorrow morning,   R     Signed and Held   Signed and Held  Comprehensive metabolic panel  Tomorrow morning,   R     Signed and Held          Vitals/Pain Today's Vitals   06/29/18 0811 06/29/18 0830 06/29/18 0900 06/29/18 0930  BP:  119/62 139/72 137/65  Pulse: 84 82 76 89  Resp: 20 18 (!) 30 (!) 23  Temp:      TempSrc:      SpO2: 95% (!) 88% 93% 94%  Weight:      Height:      PainSc:        Isolation Precautions No active isolations  Medications Medications  sodium chloride (PF) 0.9 % injection (has no administration in time range)  iopamidol (ISOVUE-370) 76 % injection (has no administration in time range)  buPROPion (WELLBUTRIN SR) 12 hr tablet 150 mg (has no administration in time range)  LORazepam (ATIVAN) tablet 1 mg (1 mg Oral Given 06/29/18 1112)  sertraline (ZOLOFT) tablet 100 mg (has no administration in time range)  albuterol (PROVENTIL) (2.5 MG/3ML) 0.083% nebulizer solution 2.5 mg (2.5 mg Nebulization Given 06/29/18 1107)  0.9 %  sodium chloride infusion ( Intravenous New Bag/Given 06/29/18 1005)  acetaminophen (TYLENOL) tablet 650 mg (has no administration in time range)    Or  acetaminophen (TYLENOL) suppository 650 mg (has no  administration in time range)  ceFEPIme (MAXIPIME) 1 g in sodium chloride 0.9 % 100 mL IVPB (has no administration in time range)  metroNIDAZOLE (FLAGYL) IVPB 500 mg (has no administration in time range)  iopamidol (ISOVUE-370) 76 % injection 100 mL (100 mLs Intravenous Contrast Given 06/29/18 0027)  ceFEPIme (MAXIPIME) 2 g in sodium chloride 0.9 % 100 mL IVPB (0 g Intravenous Stopped 06/29/18 0941)    Mobility non-ambulatory

## 2018-06-29 NOTE — ED Notes (Signed)
Pt stuck multiple times.  Unable to obtain second set of cultures.

## 2018-06-29 NOTE — ED Notes (Signed)
Pt's wife given graham crackers, peanut butter and a water due to being a diabetic. Pt is sleeping soundly. No acute distress.

## 2018-06-29 NOTE — ED Notes (Signed)
Urine and culture sent to lab  

## 2018-06-30 ENCOUNTER — Inpatient Hospital Stay (HOSPITAL_COMMUNITY): Payer: Medicare Other

## 2018-06-30 LAB — COMPREHENSIVE METABOLIC PANEL
ALT: 10 U/L (ref 0–44)
AST: 14 U/L — ABNORMAL LOW (ref 15–41)
Albumin: 3.4 g/dL — ABNORMAL LOW (ref 3.5–5.0)
Alkaline Phosphatase: 78 U/L (ref 38–126)
Anion gap: 9 (ref 5–15)
BILIRUBIN TOTAL: 0.5 mg/dL (ref 0.3–1.2)
BUN: 23 mg/dL (ref 8–23)
CO2: 30 mmol/L (ref 22–32)
Calcium: 8.9 mg/dL (ref 8.9–10.3)
Chloride: 98 mmol/L (ref 98–111)
Creatinine, Ser: 0.48 mg/dL — ABNORMAL LOW (ref 0.61–1.24)
GFR calc Af Amer: >60 mL/min (ref >60)
GFR calc non Af Amer: >60 mL/min (ref >60)
GLUCOSE: 133 mg/dL — AB (ref 70–99)
Potassium: 4 mmol/L (ref 3.5–5.1)
Sodium: 137 mmol/L (ref 135–145)
TOTAL PROTEIN: 6.1 g/dL — AB (ref 6.5–8.1)

## 2018-06-30 LAB — CBC
HCT: 38.1 % — ABNORMAL LOW (ref 39.0–52.0)
Hemoglobin: 11.6 g/dL — ABNORMAL LOW (ref 13.0–17.0)
MCH: 31.1 pg (ref 26.0–34.0)
MCHC: 30.4 g/dL (ref 30.0–36.0)
MCV: 102.1 fL — AB (ref 80.0–100.0)
NRBC: 0 % (ref 0.0–0.2)
Platelets: 413 10*3/uL — ABNORMAL HIGH (ref 150–400)
RBC: 3.73 MIL/uL — ABNORMAL LOW (ref 4.22–5.81)
RDW: 14.9 % (ref 11.5–15.5)
WBC: 35.4 10*3/uL — ABNORMAL HIGH (ref 4.0–10.5)

## 2018-06-30 LAB — PROCALCITONIN: Procalcitonin: 0.76 ng/mL

## 2018-06-30 LAB — URINE CULTURE

## 2018-06-30 LAB — LACTIC ACID, PLASMA: Lactic Acid, Venous: 1.4 mmol/L (ref 0.5–1.9)

## 2018-06-30 MED ORDER — PHENAZOPYRIDINE HCL 200 MG PO TABS
200.0000 mg | ORAL_TABLET | Freq: Three times a day (TID) | ORAL | Status: AC
  Administered 2018-06-30 – 2018-07-02 (×6): 200 mg via ORAL
  Filled 2018-06-30 (×6): qty 1

## 2018-06-30 NOTE — Plan of Care (Signed)
  Problem: Elimination: Goal: Will not experience complications related to urinary retention Outcome: Progressing   Problem: Pain Managment: Goal: General experience of comfort will improve Outcome: Progressing   Problem: Safety: Goal: Ability to remain free from injury will improve Outcome: Progressing   

## 2018-06-30 NOTE — Progress Notes (Signed)
Rt took pt off BIPAP and placed on 4 LPM [place]. No distress noted at this time. 

## 2018-06-30 NOTE — Progress Notes (Signed)
Order for swallow eval received, will proceed with MBS given h/o postpolio, respiratory deficits and possible dysphagia.  Per xray, testing can be done around noon.  Luanna Salk, [facility] [place] SLP Acute Rehab Services Pager [phone] Office [phone]

## 2018-06-30 NOTE — Progress Notes (Signed)
PROGRESS NOTE  Manuel Reeves YDX:[id number] DOB: 02/20/1933 DOA: 06/28/2018 PCP: Crist Infante, MD  HPI/Recap of past 24 hours: Manuel Reeves is a 85 y.o. male with medical history significant for CLL, post polio syndrome, wheelchair-bound at baseline, BPH just underwent TURP on 1/31, postop course was complicated by urinary retention and hematuria, subsequently went home with a Foley catheter 2 weeks ago, was seen by urology Dr. Karsten Ro in follow-up and had his catheter removed yesterday morning. -By yesterday afternoon started having shaking chills, some weakness, shortness of breath, patient and spouse live at an independent living facility he was evaluated by a nurse there who noted that his heart rate and blood pressure were significantly high.  He was sent to the emergency room to be evaluated. -Patient denies any cough congestion or shortness of breath, just had a single episode of dyspnea yesterday afternoon.  He also denies urinary retention abdominal pain nausea vomiting diarrhea chest pain.  ED Course: He was found to be profoundly hypoxic requiring nonrebreather mask initially, subsequently weaned down to 6 L O2, labs in the ED were notable for white count of 26,000, Urine was cloudy with large RBC, numerous white blood cells, positive leukocyte esterase, in addition CT chest was done which was negative for pulmonary embolism or pneumonia, debris within trachea, ? Aspirated material, bibasilar atelectasis.   06/30/18: Seen and examined at his bedside. No chest pain or dyspnea at rest.  Still requiring oxygen supplementation to maintain O2 saturation above 90%.  Assessment/Plan: Active Problems:   CLL (chronic lymphocytic leukemia) (HCC)   Post-polio syndrome   BPH (benign prostatic hyperplasia)   Acute and chronic respiratory failure with hypercapnia (HCC)   Sepsis (HCC)  Sepsis, unclear etiology Could be secondary to a urinary source due to recent TURP  Pyuria noted in  urine analysis  Urine culture suggest recollection  Blood cultures x2 in process Currently treated empirically with IV cefepime and IV Flagyl  Passed swallow evaluation with low risk for aspiration  Hypoxia secondary to partial right lower lobe collapse versus others Possibly HCAP, low risk for aspiration  DC IV Flagyl and continue IV cefepime Independent review CT chest done on admission which revealed right lower lobe atelectasis versus pleural effusion with partial right lower lobe collapse Obtain procalcitonin level Continue cefepime Maintain O2 saturation greater than 92% Repeat CBC with differential in the morning  History of CLL -Followed by Dr. Alen Blew currently on surveillance WBC trending up from 26K to 35K Suspect leukocytosis is contributed by CLL Repeat CBC with differential in the morning  History of BPH -Recent TURP, complicated by hematuria and urinary retention -Catheter removed on 06/28/2018 -No evidence of urinary retention at this time, monitor -Followed by Dr. Kathie Rhodes Continue tamsulosin  History of chronic respiratory failure/CO2 retention -On BiPAP nightly, suspect this is secondary to post polio syndrome and scoliosis  Chronic diastolic CHF Last 2D echo done on 05/06/2018 revealed LVEF 65 to 70% with grade 1 diastolic dysfunction Implement strict I's and O's and daily weight Not on any specific cardiac medications at home    DVT prophylaxis:  Subcu Lovenox daily Code Status: Full code, discussed patient to reconsider this Family Communication: Wife at bedside Disposition Plan: Home pending clinical improvement Consults called: None Admission status: Inpatient   Objective: Vitals:   06/30/18 1000 06/30/18 1100 06/30/18 1200 06/30/18 1300  BP: 104/88 (!) 111/53 (!) 132/57 (!) 115/96  Pulse: 86 83 83 89  Resp: (!) 21 19 (!) 23 (!)  22  Temp:   (!) 97.5 F (36.4 C)   TempSrc:   Axillary   SpO2: 98% 96% 91% 100%  Weight:      Height:         Intake/Output Summary (Last 24 hours) at 06/30/2018 1415 Last data filed at 06/30/2018 1300 Gross per 24 hour  Intake 877.45 ml  Output 1500 ml  Net -622.55 ml   Filed Weights   06/28/18 2032 06/29/18 1704 06/30/18 0500  Weight: 81 kg 77.7 kg 77.5 kg    Exam:  . General: 85 y.o. year-old male well developed well nourished in no acute distress.  Alert and oriented x3. . Cardiovascular: Regular rate and rhythm with no rubs or gallops.  No thyromegaly or JVD noted.   Marland Kitchen Respiratory: Mild rales at bases with no wheezes.  Poor inspiratory effort. . Abdomen: Soft nontender nondistended with normal bowel sounds x4 quadrants. . Musculoskeletal: Trace lower extremity edema. 2/4 pulses in all 4 extremities. Marland Kitchen Psychiatry: Mood is appropriate for condition and setting   Data Reviewed: CBC: Recent Labs  Lab 06/28/18 2216 06/28/18 2225 06/30/18 0309  WBC 26.3*  --  35.4*  NEUTROABS 18.4*  --   --   HGB 11.1* 11.6* 11.6*  HCT 35.6* 34.0* 38.1*  MCV 101.1*  --  102.1*  PLT 436*  --  789*   Basic Metabolic Panel: Recent Labs  Lab 06/28/18 2216 06/28/18 2222 06/28/18 2225 06/30/18 0309  NA 132*  --  133* 137  K 4.1  --  4.2 4.0  CL 97*  --   --  98  CO2 29  --   --  30  GLUCOSE 121*  --   --  133*  BUN 28*  --   --  23  CREATININE 0.44* 0.60*  --  0.48*  CALCIUM 8.7*  --   --  8.9   GFR: Estimated Creatinine Clearance: 76.7 mL/min (A) (by C-G formula based on SCr of 0.48 mg/dL (L)). Liver Function Tests: Recent Labs  Lab 06/30/18 0309  AST 14*  ALT 10  ALKPHOS 78  BILITOT 0.5  PROT 6.1*  ALBUMIN 3.4*   No results for input(s): LIPASE, AMYLASE in the last 168 hours. No results for input(s): AMMONIA in the last 168 hours. Coagulation Profile: No results for input(s): INR, PROTIME in the last 168 hours. Cardiac Enzymes: No results for input(s): CKTOTAL, CKMB, CKMBINDEX, TROPONINI in the last 168 hours. BNP (last 3 results) No results for input(s): PROBNP in the  last 8760 hours. HbA1C: No results for input(s): HGBA1C in the last 72 hours. CBG: Recent Labs  Lab 06/28/18 2247  GLUCAP 99   Lipid Profile: No results for input(s): CHOL, HDL, LDLCALC, TRIG, CHOLHDL, LDLDIRECT in the last 72 hours. Thyroid Function Tests: No results for input(s): TSH, T4TOTAL, FREET4, T3FREE, THYROIDAB in the last 72 hours. Anemia Panel: No results for input(s): VITAMINB12, FOLATE, FERRITIN, TIBC, IRON, RETICCTPCT in the last 72 hours. Urine analysis:    Component Value Date/Time   COLORURINE YELLOW 06/29/2018 0113   APPEARANCEUR CLOUDY (A) 06/29/2018 0113   LABSPEC 1.019 06/29/2018 0113   PHURINE 6.0 06/29/2018 0113   GLUCOSEU NEGATIVE 06/29/2018 0113   HGBUR LARGE (A) 06/29/2018 0113   BILIRUBINUR NEGATIVE 06/29/2018 0113   KETONESUR 5 (A) 06/29/2018 0113   PROTEINUR 100 (A) 06/29/2018 0113   NITRITE NEGATIVE 06/29/2018 0113   LEUKOCYTESUR LARGE (A) 06/29/2018 0113   Sepsis Labs: @LABRCNTIP (procalcitonin:4,lacticidven:4)  ) Recent Results (from the past  240 hour(s))  Urine culture     Status: Abnormal   Collection Time: 06/21/18  4:36 PM  Result Value Ref Range Status   Specimen Description   Final    URINE, RANDOM Performed at [place] [address] [place], [place] [address]    Special Requests   Final    NONE Performed at [facility], [place] [address] [place], [place] [address]    Culture (A)  Final    <10,000 COLONIES/mL INSIGNIFICANT GROWTH Performed at [facility] [address] [place], [place] [address]    Report Status 06/23/2018 FINAL  Final  Culture, blood (routine x 2)     Status: None (Preliminary result)   Collection Time: 06/29/18  9:07 AM  Result Value Ref Range Status   Specimen Description   Final    BLOOD LEFT HAND Performed at [facility] [address] [place], [place] [address]    Special Requests   Final    BOTTLES DRAWN AEROBIC AND  ANAEROBIC Blood Culture adequate volume Performed at [facility] [address] [place], [place] [address]    Culture   Final    NO GROWTH 1 DAY Performed at [facility], [place] [address], [place], [place] [address]    Report Status PENDING  Incomplete  Culture, Urine     Status: None   Collection Time: 06/29/18  9:07 AM  Result Value Ref Range Status   Specimen Description   Final    URINE, CLEAN CATCH Performed at [facility], [place] [address] [place], [place] [address]    Special Requests   Final    NONE Performed at [facility], [place] [address], [place], [place] [address]    Culture   Final    Multiple bacterial morphotypes present, none predominant. Suggest appropriate recollection if clinically indicated.   Report Status 06/30/2018 FINAL  Final  MRSA PCR Screening     Status: None   Collection Time: 06/29/18  5:08 PM  Result Value Ref Range Status   MRSA by PCR NEGATIVE NEGATIVE Final    Comment:        The GeneXpert MRSA Assay (FDA approved for NASAL specimens only), is one component of a comprehensive MRSA colonization surveillance program. It is not intended to diagnose MRSA infection nor to guide or monitor treatment for MRSA infections. Performed at [facility], [place] [address], [place], [place] [address]       Studies: Dg Chest 2 View  Result Date: 06/30/2018 CLINICAL DATA:  85 year old male with a history of hypoxia EXAM: CHEST - 2 VIEW COMPARISON:  06/29/2018, 06/28/2018, 05/23/2018, CT 06/29/2018 FINDINGS: Cardiomediastinal silhouette likely unchanged, with left rotation of the patient. Opacity at the bilateral lung bases partially obscuring the hemidiaphragm bilaterally. Appearance similar to the plain film yesterday's date. No pneumothorax. Significant scoliotic curvature with associated degenerative changes again noted IMPRESSION: Bibasilar opacities, similar to the prior,  likely representing a combination of aspiration pneumonitis/pneumonia, atelectasis, and/or pleural fluid. Electronically Signed   By: Corrie Mckusick D.O.   On: 06/30/2018 09:34   Dg Chest Portable 1 View  Result Date: 06/29/2018 CLINICAL DATA:  Sudden worsening of dyspnea. History of CLL, polio EXAM: PORTABLE CHEST 1 VIEW COMPARISON:  06/28/2018 CXR FINDINGS: AP portable semi upright view of the chest. Stable dextroconvex curvature of the thoracic spine. Normal cardiopericardial silhouette. Stable mediastinal contours. Hazy opacities at the lung  bases are noted. Small layering effusions are not excluded. Interstitial edema is noted. IMPRESSION: Mild interstitial edema. Hazy stable opacities at the lung bases may reflect small pleural effusions and/or atelectasis. Electronically Signed   By: Ashley Royalty M.D.   On: 06/29/2018 15:54    Scheduled Meds: . bisacodyl  10 mg Oral Daily  . buPROPion  150 mg Oral Daily  . chlorhexidine  15 mL Mouth Rinse BID  . enoxaparin (LOVENOX) injection  40 mg Subcutaneous Q24H  . mouth rinse  15 mL Mouth Rinse q12n4p  . sertraline  100 mg Oral Daily  . tamsulosin  0.8 mg Oral QPM    Continuous Infusions: . ceFEPime (MAXIPIME) IV Stopped (06/30/18 1110)  . metronidazole 500 mg (06/30/18 1353)     LOS: 1 day     Kayleen Memos, MD [facility] Pager [phone]  If 7PM-7AM, please contact night-coverage [email or web address] Password [email or web address] 06/30/2018, 2:15 PM

## 2018-06-30 NOTE — Progress Notes (Signed)
Nutrition Brief Note  Patient identified on the Malnutrition Screening Tool (MST) Report  Wt Readings from Last 15 Encounters:  06/30/18 77.5 kg  06/21/18 81 kg  06/10/18 81.3 kg  05/27/18 80.7 kg  05/03/18 82.6 kg  04/22/18 82.6 kg  04/11/18 84 kg  03/31/18 84 kg  03/02/17 90.7 kg  03/23/12 93 kg  03/09/12 93 kg    Body mass index is 23.17 kg/m. Patient meets criteria for normal weight based on current BMI. Per chart review, current weight is 171 lb and weight on 05/27/2018 was 177 lb. This indicates 6 lb weight loss (3.4% body weight) in the past 1 month; not significant for time frame. Admission (2/18) weight was consistent with weight on 1/17, but noted that patient is prescribed 60 mg IV lasix BID.  Patient from a facility. He receives 3 meals/day there. Wife is at bedside and reports patient with a good appetite at baseline.   Patient underwent MBS earlier today. Reviewed SLP note from this. Recommendation was made by SLP for patient to be on Regular diet with thin liquids. Diet advanced per SLP recommendation from NPO to Regular today at 1:50 PM. Lunch tray was delivered a few minutes ago and patient had not yet been able to begin eating. Labs and medications reviewed.   No nutrition interventions warranted at this time. If nutrition issues arise, please consult RD.     Jarome Matin, MS, RD, LDN, [facility] Inpatient Clinical Dietitian Pager # [phone] After hours/weekend pager # [phone]

## 2018-06-30 NOTE — Procedures (Signed)
Objective Swallowing Evaluation: Type of Study: Bedside Swallow Evaluation   Patient Details  Name: Manuel Reeves MRN: [id number] Date of Birth: 1932-09-22  Today's Date: 06/30/2018 Time: SLP Start Time (ACUTE ONLY): 1252 -SLP Stop Time (ACUTE ONLY): 1305  SLP Time Calculation (min) (ACUTE ONLY): 13 min   Past Medical History:  Past Medical History:  Diagnosis Date  . Anxiety   . CLL (chronic lymphocytic leukemia) ([place]) 02/20/2014  . Osteoporosis   . Pneumonia   . Polio   . Scoliosis    Past Surgical History:  Past Surgical History:  Procedure Laterality Date  . CATARACT EXTRACTION  09/2011   at [facility]  . COLONOSCOPY  03/2007  . RETINAL DETACHMENT SURGERY  09/1995  . SPINAL FUSION  10/1952  . TONSILLECTOMY    . TRANSURETHRAL RESECTION OF PROSTATE N/A 06/10/2018   Procedure: TRANSURETHRAL RESECTION OF THE PROSTATE (TURP);  Surgeon: Kathie Rhodes, MD;  Location: [facility] ORS;  Service: Urology;  Laterality: N/A;   HPI: 85 yo male with CLL, post-poliomyelitis and bilateral leg weakness adm to [facility] with productive cough increased chest congestion.  Was in the hospital in December with pna and February for elective prostate resection.   Current hospital coarse complicated by pt's respiratory difficulties, increased work of breathing and diffuse rhonci - requiring BVM and ED MD ordered Bipap and CXR.   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  PMH also + for scoliosis, BPH, osteoporosis, depression and anxiety. Pt has suffered falls and hit head on toilet 02/2018.   Pt CXR 06/29/2018 Mild interstitial edema, Hazy stable opacities at the lung bases may reflect small pleural effusions and/or atelectasis. Bibasilar opacities, similar to the prior, likely representing a pneumonitis/pneumonia, atelectasis, and/or aspiration or pleural fluid.  Swallow eval ordered.   MBS ordered  in lieu of clinical swallow evaluation.     Subjective: pt awake in chair    Assessment / Plan / Recommendation  CHL IP CLINICAL IMPRESSIONS 06/30/2018  Clinical Impression Pt with functional oropharyngeal swallow ability. NO aspiration observed and trace laryngeal penetration of thin only observed with head in chin tuck posture.  Cued cough did not clear mild penetrates.  Chin tuck posture tested due to pt having mild vallecular residuals to determine if would be preventative - however uncertain to its effectiveness and this posture allowed pharyngeal residuals to spill into open larynx.  Pt with only mild vallecular residuals with pudding and cracker without sensation.   This residual is WFL given pt's age, however due to his increased pulmonary risk from paralysis - recommend follow solids with liquids to faciliate clearance.  Pt educated to findings/recommendations during MBS, wife after in his room.  SLP also provided swallow precaution signs for reference.  Encouraged pt to sit fully upright as able to meals and 30 minutes after po.    SLP Visit Diagnosis Dysphagia, unspecified (R13.10)  Attention and concentration deficit following --  Frontal lobe and executive function deficit following --  Impact on safety and function Mild aspiration risk      CHL IP TREATMENT RECOMMENDATION 06/30/2018  Treatment Recommendations Therapy as outlined in treatment plan below     Prognosis 06/30/2018  Prognosis for Safe Diet Advancement Good  Barriers to Reach Goals --  Barriers/Prognosis Comment --    CHL IP DIET RECOMMENDATION 06/30/2018  SLP Diet Recommendations Regular solids;Thin liquid  Liquid Administration via --  Medication Administration Whole  meds with puree  Compensations Slow rate;Small sips/bites;Follow solids with liquid  Postural Changes Remain semi-upright after after feeds/meals (Comment);Seated upright at 90 degrees      CHL IP OTHER RECOMMENDATIONS 06/30/2018  Recommended  Consults --  Oral Care Recommendations Oral care BID  Other Recommendations --      CHL IP FOLLOW UP RECOMMENDATIONS 04/22/2018  Follow up Recommendations None      CHL IP FREQUENCY AND DURATION 06/30/2018  Speech Therapy Frequency (ACUTE ONLY) min 1 x/week  Treatment Duration 1 week           CHL IP ORAL PHASE 06/30/2018  Oral Phase WFL  Oral - Pudding Teaspoon --  Oral - Pudding Cup --  Oral - Honey Teaspoon --  Oral - Honey Cup --  Oral - Nectar Teaspoon --  Oral - Nectar Cup --  Oral - Nectar Straw --  Oral - Thin Teaspoon --  Oral - Thin Cup --  Oral - Thin Straw --  Oral - Puree --  Oral - Mech Soft --  Oral - Regular --  Oral - Multi-Consistency --  Oral - Pill --  Oral Phase - Comment --    CHL IP PHARYNGEAL PHASE 06/30/2018  Pharyngeal Phase WFL  Pharyngeal- Pudding Teaspoon --  Pharyngeal --  Pharyngeal- Pudding Cup --  Pharyngeal --  Pharyngeal- Honey Teaspoon --  Pharyngeal --  Pharyngeal- Honey Cup --  Pharyngeal --  Pharyngeal- Nectar Teaspoon --  Pharyngeal --  Pharyngeal- Nectar Cup --  Pharyngeal --  Pharyngeal- Nectar Straw WFL  Pharyngeal --  Pharyngeal- Thin Teaspoon --  Pharyngeal --  Pharyngeal- Thin Cup --  Pharyngeal --  Pharyngeal- Thin Straw WFL  Pharyngeal --  Pharyngeal- Puree WFL  Pharyngeal --  Pharyngeal- Mechanical Soft --  Pharyngeal --  Pharyngeal- Regular WFL  Pharyngeal --  Pharyngeal- Multi-consistency --  Pharyngeal --  Pharyngeal- Pill WFL  Pharyngeal --  Pharyngeal Comment --     CHL IP CERVICAL ESOPHAGEAL PHASE 06/30/2018  Cervical Esophageal Phase WFL  Pudding Teaspoon --  Pudding Cup --  Honey Teaspoon --  Honey Cup --  Nectar Teaspoon --  Nectar Cup --  Nectar Straw --  Thin Teaspoon --  Thin Cup --  Thin Straw --  Puree --  Mechanical Soft --  Regular --  Multi-consistency --  Pill --  Cervical Esophageal Comment --     Macario Golds 06/30/2018, 2:17 PM    Luanna Salk, MS  [facility] SLP Acute Rehab Services Pager [phone] Office [phone]

## 2018-07-01 ENCOUNTER — Encounter (HOSPITAL_COMMUNITY): Payer: Self-pay | Admitting: *Deleted

## 2018-07-01 LAB — URINE CULTURE

## 2018-07-01 LAB — CBC WITH DIFFERENTIAL/PLATELET
Abs Immature Granulocytes: 0 10*3/uL (ref 0.00–0.07)
BASOS PCT: 0 %
Basophils Absolute: 0 10*3/uL (ref 0.0–0.1)
Eosinophils Absolute: 0 10*3/uL (ref 0.0–0.5)
Eosinophils Relative: 0 %
HCT: 34.2 % — ABNORMAL LOW (ref 39.0–52.0)
HEMOGLOBIN: 10.1 g/dL — AB (ref 13.0–17.0)
Lymphocytes Relative: 35 %
Lymphs Abs: 7.4 10*3/uL — ABNORMAL HIGH (ref 0.7–4.0)
MCH: 30.8 pg (ref 26.0–34.0)
MCHC: 29.5 g/dL — ABNORMAL LOW (ref 30.0–36.0)
MCV: 104.3 fL — ABNORMAL HIGH (ref 80.0–100.0)
Monocytes Absolute: 0.4 10*3/uL (ref 0.1–1.0)
Monocytes Relative: 2 %
NEUTROS ABS: 13.4 10*3/uL — AB (ref 1.7–7.7)
Neutrophils Relative %: 63 %
PLATELETS: 352 10*3/uL (ref 150–400)
RBC: 3.28 MIL/uL — ABNORMAL LOW (ref 4.22–5.81)
RDW: 14.9 % (ref 11.5–15.5)
WBC: 21.2 10*3/uL — ABNORMAL HIGH (ref 4.0–10.5)
nRBC: 0 % (ref 0.0–0.2)

## 2018-07-01 NOTE — Progress Notes (Signed)
PROGRESS NOTE  Manuel Reeves VVO:[id number] DOB: 1932-07-25 DOA: 06/28/2018 PCP: Crist Infante, MD  HPI/Recap of past 24 hours: Manuel Reeves is a 85 y.o. male with medical history significant for CLL, post polio syndrome, wheelchair-bound at baseline, BPH just underwent TURP on 1/31, postop course was complicated by urinary retention and hematuria, subsequently went home with a Foley catheter 2 weeks ago, was seen by urology Dr. Karsten Ro in follow-up and had his catheter removed yesterday morning. -By yesterday afternoon started having shaking chills, some weakness, shortness of breath, patient and spouse live at an independent living facility he was evaluated by a nurse there who noted that his heart rate and blood pressure were significantly high.  He was sent to the emergency room to be evaluated. -Patient denies any cough congestion or shortness of breath, just had a single episode of dyspnea yesterday afternoon.  He also denies urinary retention abdominal pain nausea vomiting diarrhea chest pain.  ED Course: He was found to be profoundly hypoxic requiring nonrebreather mask initially, subsequently weaned down to 6 L O2, labs in the ED were notable for white count of 26,000, Urine was cloudy with large RBC, numerous white blood cells, positive leukocyte esterase, in addition CT chest was done which was negative for pulmonary embolism or pneumonia, debris within trachea, ? Aspirated material, bibasilar atelectasis.   06/30/18: Seen and examined at his bedside. No chest pain or dyspnea at rest.  Still requiring oxygen supplementation to maintain O2 saturation above 90%.  07/01/18: Seen and examined at bedside.  No acute events overnight.  Denies cough, chest pain or dyspnea at rest.  Patient is wheelchair-bound and lives in an assisted living facility.  He wants to go home.  Urine culture pending.  WBC appears to be close to his baseline.  We will continue cefepime empirically and switch to  oral cefdinir possibly later this afternoon.  Assessment/Plan: Active Problems:   CLL (chronic lymphocytic leukemia) (HCC)   Post-polio syndrome   BPH (benign prostatic hyperplasia)   Acute and chronic respiratory failure with hypercapnia (HCC)   Sepsis (HCC)  Sepsis, unclear etiology Could be secondary to a urinary source due to recent TURP  Pyuria noted in urine analysis  Urine culture suggest recollection  Repeated urine culture in process Blood cultures x2 no growth in 2 days Currently treated empirically with IV cefepime  Passed swallow evaluation with low risk for aspiration  Acute hypoxic respiratory failure secondary to partial right lower lobe collapse versus others Possibly HCAP, low risk for aspiration  DC IV Flagyl and continue IV cefepime Procalcitonin 0.78  Continue cefepime Obtain home O2 evaluation Maintain O2 saturation greater than 92%  History of CLL -Followed by Dr. Alen Blew currently on surveillance Suspect leukocytosis is contributed by CLL Baseline WBC appears to be 17.0 WBC is improving and trending down  History of BPH with lower urinary tract symptoms -Recent TURP, complicated by hematuria and urinary retention -Catheter removed on 06/28/2018 -No evidence of urinary retention at this time, monitor -Followed by Dr. Kathie Rhodes -Continue tamsulosin -Continue Pyridium 3 times daily  History of chronic CO2 retention -On BiPAP nightly, suspect this is secondary to post polio syndrome and scoliosis  Chronic diastolic CHF Last 2D echo done on 05/06/2018 revealed LVEF 65 to 70% with grade 1 diastolic dysfunction Implement strict I's and O's and daily weight Not on any specific cardiac medications at home  Ambulatory dysfunction Patient is wheelchair-bound Lives in an assisted living Social worker consulted for placement  back to ALF   DVT prophylaxis:  Subcu Lovenox daily Code Status: Full code, discussed patient to reconsider this Family  Communication:  Updated wife on 06/30/2018 on the phone Disposition Plan:  ALF possibly tomorrow 07/02/2018 Consults called: None   Objective: Vitals:   07/01/18 0800 07/01/18 0900 07/01/18 1000 07/01/18 1100  BP: (!) 122/51 (!) 151/104 113/84 (!) 111/47  Pulse: 82 82 77 90  Resp: 19 18 19  (!) 22  Temp: 97.6 F (36.4 C)     TempSrc: Oral     SpO2: 100% 100% 100% 96%  Weight:      Height:        Intake/Output Summary (Last 24 hours) at 07/01/2018 1142 Last data filed at 07/01/2018 1000 Gross per 24 hour  Intake 554.11 ml  Output 250 ml  Net 304.11 ml   Filed Weights   06/28/18 2032 06/29/18 1704 06/30/18 0500  Weight: 81 kg 77.7 kg 77.5 kg    Exam:  . General: 85 y.o. year-old male well-developed well-nourished no acute distress.  Alert and oriented x3. . Cardiovascular: Regular rate and rhythm with no rubs or gallops.  No JVD or thyromegaly.   Marland Kitchen Respiratory: Clear to auscultation with no wheezes or rales.  Poor inspiratory effort. . Abdomen: Soft nontender nondistended with normal bowel sounds x4 quadrants. . Musculoskeletal: 1+ pitting edema in lower extremity bilaterally from ankle down. 2/4 pulses in all 4 extremities. Marland Kitchen Psychiatry: Mood is appropriate for condition and setting   Data Reviewed: CBC: Recent Labs  Lab 06/28/18 2216 06/28/18 2225 06/30/18 0309 07/01/18 0303  WBC 26.3*  --  35.4* 21.2*  NEUTROABS 18.4*  --   --  13.4*  HGB 11.1* 11.6* 11.6* 10.1*  HCT 35.6* 34.0* 38.1* 34.2*  MCV 101.1*  --  102.1* 104.3*  PLT 436*  --  413* 725   Basic Metabolic Panel: Recent Labs  Lab 06/28/18 2216 06/28/18 2222 06/28/18 2225 06/30/18 0309  NA 132*  --  133* 137  K 4.1  --  4.2 4.0  CL 97*  --   --  98  CO2 29  --   --  30  GLUCOSE 121*  --   --  133*  BUN 28*  --   --  23  CREATININE 0.44* 0.60*  --  0.48*  CALCIUM 8.7*  --   --  8.9   GFR: Estimated Creatinine Clearance: 76.7 mL/min (A) (by C-G formula based on SCr of 0.48 mg/dL (L)). Liver  Function Tests: Recent Labs  Lab 06/30/18 0309  AST 14*  ALT 10  ALKPHOS 78  BILITOT 0.5  PROT 6.1*  ALBUMIN 3.4*   No results for input(s): LIPASE, AMYLASE in the last 168 hours. No results for input(s): AMMONIA in the last 168 hours. Coagulation Profile: No results for input(s): INR, PROTIME in the last 168 hours. Cardiac Enzymes: No results for input(s): CKTOTAL, CKMB, CKMBINDEX, TROPONINI in the last 168 hours. BNP (last 3 results) No results for input(s): PROBNP in the last 8760 hours. HbA1C: No results for input(s): HGBA1C in the last 72 hours. CBG: Recent Labs  Lab 06/28/18 2247  GLUCAP 99   Lipid Profile: No results for input(s): CHOL, HDL, LDLCALC, TRIG, CHOLHDL, LDLDIRECT in the last 72 hours. Thyroid Function Tests: No results for input(s): TSH, T4TOTAL, FREET4, T3FREE, THYROIDAB in the last 72 hours. Anemia Panel: No results for input(s): VITAMINB12, FOLATE, FERRITIN, TIBC, IRON, RETICCTPCT in the last 72 hours. Urine analysis:    Component  Value Date/Time   COLORURINE YELLOW 06/29/2018 0113   APPEARANCEUR CLOUDY (A) 06/29/2018 0113   LABSPEC 1.019 06/29/2018 0113   PHURINE 6.0 06/29/2018 0113   GLUCOSEU NEGATIVE 06/29/2018 0113   HGBUR LARGE (A) 06/29/2018 0113   BILIRUBINUR NEGATIVE 06/29/2018 0113   KETONESUR 5 (A) 06/29/2018 0113   PROTEINUR 100 (A) 06/29/2018 0113   NITRITE NEGATIVE 06/29/2018 0113   LEUKOCYTESUR LARGE (A) 06/29/2018 0113   Sepsis Labs: @LABRCNTIP (procalcitonin:4,lacticidven:4)  ) Recent Results (from the past 240 hour(s))  Urine culture     Status: Abnormal   Collection Time: 06/21/18  4:36 PM  Result Value Ref Range Status   Specimen Description   Final    URINE, RANDOM Performed at [facility], [place] [address], [place], [place] [address]    Special Requests   Final    NONE Performed at [facility], [place] [address] [place], [place] [address]    Culture (A)  Final    <10,000  COLONIES/mL INSIGNIFICANT GROWTH Performed at [place] [address] [place], [place] [address]    Report Status 06/23/2018 FINAL  Final  Culture, blood (routine x 2)     Status: None (Preliminary result)   Collection Time: 06/29/18  9:07 AM  Result Value Ref Range Status   Specimen Description BLOOD LEFT HAND  Final   Special Requests   Final    BOTTLES DRAWN AEROBIC AND ANAEROBIC Blood Culture adequate volume Performed at [place] [address] [place], [place] [address]    Culture NO GROWTH 2 DAYS  Final   Report Status PENDING  Incomplete  Culture, Urine     Status: None   Collection Time: 06/29/18  9:07 AM  Result Value Ref Range Status   Specimen Description   Final    URINE, CLEAN CATCH Performed at [facility], [place] [address] [place], [place] [address]    Special Requests   Final    NONE Performed at [facility], [place] [address] [place], [place] [address]    Culture   Final    Multiple bacterial morphotypes present, none predominant. Suggest appropriate recollection if clinically indicated.   Report Status 06/30/2018 FINAL  Final  MRSA PCR Screening     Status: None   Collection Time: 06/29/18  5:08 PM  Result Value Ref Range Status   MRSA by PCR NEGATIVE NEGATIVE Final    Comment:        The GeneXpert MRSA Assay (FDA approved for NASAL specimens only), is one component of a comprehensive MRSA colonization surveillance program. It is not intended to diagnose MRSA infection nor to guide or monitor treatment for MRSA infections. Performed at [facility], [place] [address], [place], [place] [address]       Studies: Dg Swallowing Func-speech Pathology  Result Date: 06/30/2018 Objective Swallowing Evaluation: Type of Study: Bedside Swallow Evaluation  Patient Details Name: DEWON MENDIZABAL MRN: [id number] Date of Birth: [birth year] Today's Date: 06/30/2018 Time: SLP  Start Time (ACUTE ONLY): 1252 -SLP Stop Time (ACUTE ONLY): 1305 SLP Time Calculation (min) (ACUTE ONLY): 13 min Past Medical History: Past Medical History: Diagnosis Date . Anxiety  . CLL (chronic lymphocytic leukemia) ([place]) 02/20/2014 . Osteoporosis  . Pneumonia  . Polio  . Scoliosis  Past Surgical History: Past Surgical History: Procedure Laterality Date . CATARACT EXTRACTION  09/2011  at [facility] . COLONOSCOPY  03/2007 . RETINAL DETACHMENT SURGERY  09/1995 .  SPINAL FUSION  10/1952 . TONSILLECTOMY   . TRANSURETHRAL RESECTION OF PROSTATE N/A 06/10/2018  Procedure: TRANSURETHRAL RESECTION OF THE PROSTATE (TURP);  Surgeon: Kathie Rhodes, MD;  Location: [facility];  Service: Urology;  Laterality: N/A; HPI: 85 yo male with CLL, post-poliomyelitis and bilateral leg weakness adm to [facility] with productive cough increased chest congestion.  Was in the hospital in December with pna and February for elective prostate resection.   Current hospital coarse complicated by pt's respiratory difficulties, increased work of breathing and diffuse rhonci - requiring BVM and ED MD ordered Bipap and CXR.   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  PMH also + for scoliosis, BPH, osteoporosis, depression and anxiety. Pt has suffered falls and hit head on toilet 02/2018.   Pt CXR 06/29/2018 Mild interstitial edema, Hazy stable opacities at the lung bases may reflect small pleural effusions and/or atelectasis. Bibasilar opacities, similar to the prior, likely representing a pneumonitis/pneumonia, atelectasis, and/or aspiration or pleural fluid.  Swallow eval ordered.   MBS ordered in lieu of clinical swallow evaluation.   Subjective: pt awake in chair Assessment / Plan / Recommendation CHL IP CLINICAL IMPRESSIONS 06/30/2018 Clinical Impression Pt with functional oropharyngeal swallow ability. NO aspiration observed and trace  laryngeal penetration of thin only observed with head in chin tuck posture.  Cued cough did not clear mild penetrates.  Chin tuck posture tested due to pt having mild vallecular residuals to determine if would be preventative - however uncertain to its effectiveness and this posture allowed pharyngeal residuals to spill into open larynx.  Pt with only mild vallecular residuals with pudding and cracker without sensation.   This residual is WFL given pt's age, however due to his increased pulmonary risk from paralysis - recommend follow solids with liquids to faciliate clearance.  Pt educated to findings/recommendations during MBS, wife after in his room.  SLP also provided swallow precaution signs for reference.  Encouraged pt to sit fully upright as able to meals and 30 minutes after po.   SLP Visit Diagnosis Dysphagia, unspecified (R13.10) Attention and concentration deficit following -- Frontal lobe and executive function deficit following -- Impact on safety and function Mild aspiration risk   CHL IP TREATMENT RECOMMENDATION 06/30/2018 Treatment Recommendations Therapy as outlined in treatment plan below   Prognosis 06/30/2018 Prognosis for Safe Diet Advancement Good Barriers to Reach Goals -- Barriers/Prognosis Comment -- CHL IP DIET RECOMMENDATION 06/30/2018 SLP Diet Recommendations Regular solids;Thin liquid Liquid Administration via -- Medication Administration Whole meds with puree Compensations Slow rate;Small sips/bites;Follow solids with liquid Postural Changes Remain semi-upright after after feeds/meals (Comment);Seated upright at 90 degrees   CHL IP OTHER RECOMMENDATIONS 06/30/2018 Recommended Consults -- Oral Care Recommendations Oral care BID Other Recommendations --   CHL IP FOLLOW UP RECOMMENDATIONS 04/22/2018 Follow up Recommendations None   CHL IP FREQUENCY AND DURATION 06/30/2018 Speech Therapy Frequency (ACUTE ONLY) min 1 x/week Treatment Duration 1 week      CHL IP ORAL PHASE 06/30/2018 Oral Phase  WFL Oral - Pudding Teaspoon -- Oral - Pudding Cup -- Oral - Honey Teaspoon -- Oral - Honey Cup -- Oral - Nectar Teaspoon -- Oral - Nectar Cup -- Oral - Nectar Straw -- Oral - Thin Teaspoon -- Oral - Thin Cup -- Oral - Thin Straw -- Oral - Puree -- Oral - Mech Soft -- Oral - Regular -- Oral - Multi-Consistency -- Oral - Pill -- Oral  Phase - Comment --  CHL IP PHARYNGEAL PHASE 06/30/2018 Pharyngeal Phase WFL Pharyngeal- Pudding Teaspoon -- Pharyngeal -- Pharyngeal- Pudding Cup -- Pharyngeal -- Pharyngeal- Honey Teaspoon -- Pharyngeal -- Pharyngeal- Honey Cup -- Pharyngeal -- Pharyngeal- Nectar Teaspoon -- Pharyngeal -- Pharyngeal- Nectar Cup -- Pharyngeal -- Pharyngeal- Nectar Straw WFL Pharyngeal -- Pharyngeal- Thin Teaspoon -- Pharyngeal -- Pharyngeal- Thin Cup -- Pharyngeal -- Pharyngeal- Thin Straw WFL Pharyngeal -- Pharyngeal- Puree WFL Pharyngeal -- Pharyngeal- Mechanical Soft -- Pharyngeal -- Pharyngeal- Regular WFL Pharyngeal -- Pharyngeal- Multi-consistency -- Pharyngeal -- Pharyngeal- Pill WFL Pharyngeal -- Pharyngeal Comment --  CHL IP CERVICAL ESOPHAGEAL PHASE 06/30/2018 Cervical Esophageal Phase WFL Pudding Teaspoon -- Pudding Cup -- Honey Teaspoon -- Honey Cup -- Nectar Teaspoon -- Nectar Cup -- Nectar Straw -- Thin Teaspoon -- Thin Cup -- Thin Straw -- Puree -- Mechanical Soft -- Regular -- Multi-consistency -- Pill -- Cervical Esophageal Comment -- Luanna Salk, MS [facility] SLP Acute Rehab Services Pager [phone] Office [phone] Macario Golds 06/30/2018, 2:19 PM               Scheduled Meds: . bisacodyl  10 mg Oral Daily  . buPROPion  150 mg Oral Daily  . chlorhexidine  15 mL Mouth Rinse BID  . enoxaparin (LOVENOX) injection  40 mg Subcutaneous Q24H  . mouth rinse  15 mL Mouth Rinse q12n4p  . phenazopyridine  200 mg Oral TID WC  . sertraline  100 mg Oral Daily  . tamsulosin  0.8 mg Oral QPM    Continuous Infusions: . ceFEPime (MAXIPIME) IV Stopped (07/01/18 0950)     LOS: 2  days     Kayleen Memos, MD [facility] Pager [phone]  If 7PM-7AM, please contact night-coverage [email or web address] Password [facility] 07/01/2018, 11:42 AM

## 2018-07-01 NOTE — Progress Notes (Signed)
  Speech Language Pathology Treatment: Dysphagia  Patient Details Name: Manuel Reeves MRN: [id number] DOB: Jun 06, 1932 Today's Date: 07/01/2018 Time: 0174-9449 SLP Time Calculation (min) (ACUTE ONLY): 8 min  Assessment / Plan / Recommendation Clinical Impression  Pt seen for swallow safety and reiteration of strategies. He needed verbal cues to recall alternating liquid and solid strategy. Continued education re: increased risk if he aspirates due to decreased mobility. No s/s aspiration with regular texture and thin liquids via straw. Recommend continue regular texture, thin liquids with mindfulness to follow strategies. Will sign off.    HPI HPI: 85 yo male with CLL, post-poliomyelitis and bilateral leg weakness adm to [facility] with productive cough increased chest congestion.  Was in the hospital in December with pna and February for elective prostate resection.   Current hospital coarse complicated by pt's respiratory difficulties, increased work of breathing and diffuse rhonci - requiring BVM and ED MD ordered Bipap and CXR.   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  PMH also + for scoliosis, BPH, osteoporosis, depression and anxiety. Pt has suffered falls and hit head on toilet 02/2018.   Pt CXR 06/29/2018 Mild interstitial edema, Hazy stable opacities at the lung bases may reflect small pleural effusions and/or atelectasis. Bibasilar opacities, similar to the prior, likely representing a pneumonitis/pneumonia, atelectasis, and/or aspiration or pleural fluid.  Swallow eval ordered.   MBS ordered in lieu of clinical swallow evaluation.        SLP Plan  All goals met;Discharge SLP treatment due to (comment)       Recommendations  Diet recommendations: Regular;Thin liquid Liquids provided via: Cup;Straw Medication Administration: Whole meds with liquid Supervision: Full  supervision/cueing for compensatory strategies(staff assist if needed) Compensations: Slow rate;Small sips/bites;Follow solids with liquid Postural Changes and/or Swallow Maneuvers: Seated upright 90 degrees;Upright 30-60 min after meal                Oral Care Recommendations: Oral care BID Follow up Recommendations: None SLP Visit Diagnosis: Dysphagia, unspecified (R13.10) Plan: All goals met;Discharge SLP treatment due to (comment)       GO                Houston Siren 07/01/2018, 8:46 AM   Orbie Pyo Colvin Caroli.Ed Risk analyst [phone] Office [phone]

## 2018-07-01 NOTE — Progress Notes (Signed)
LCSW consulted for dc planning.  Patietn is from [facility] independent living. No CSW intervention needed if patient returns to independent living.   LCSW signing off.   Please submit new consult if CSW needs arise.   Carolin Coy [place]

## 2018-07-02 MED ORDER — LEVOFLOXACIN 750 MG PO TABS: 750.0000 mg | ORAL_TABLET | Freq: Every day | ORAL | 0 refills | Status: AC

## 2018-07-02 MED ORDER — LEVOFLOXACIN 750 MG PO TABS
750.0000 mg | ORAL_TABLET | Freq: Every day | ORAL | Status: DC
  Administered 2018-07-02: 750 mg via ORAL
  Filled 2018-07-02: qty 1

## 2018-07-02 NOTE — Progress Notes (Signed)
Discharge instructions provided to and discussed with patient and wife. All questions answered. IV's removed. Patient placed in personal wheelchair,. assisted with belongings, pt wheeled himself, per request, and left unit with CN and NT at 1415.

## 2018-07-02 NOTE — Discharge Summary (Signed)
Discharge Summary  CHARLETON DEYOUNG YQM:[id number] DOB: 05/01/1933  PCP: Crist Infante, MD  Admit date: 06/28/2018 Discharge date: 07/02/2018  Time spent: 35 minutes  Recommendations for Outpatient Follow-up:  1. Please follow-up with your urologist within a week 2. Please follow-up with your PCP within a week 3. Take your medications as prescribed 4. Fall precautions  Discharge Diagnoses:  Active Hospital Problems   Diagnosis Date Noted  . Sepsis ([place]) 06/29/2018  . Acute and chronic respiratory failure with hypercapnia ([place]) 05/23/2018  . BPH (benign prostatic hyperplasia) 04/22/2018  . 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 CLL (chronic lymphocytic leukemia) ([place]) 02/20/2014    Resolved Hospital Problems  No resolved problems to display.    Discharge Condition: Stable  Diet recommendation: Resume previous diet  Vitals:   07/02/18 1000 07/02/18 1110  BP: (!) 103/51   Pulse: 84   Resp: 20   Temp:  97.8 F (36.6 C)  SpO2: 97%     History of present illness:   Elyas Villamor Phillipsis a 85 y.o.malewith medical history significantforCLL, post polio syndrome, wheelchair-bound at baseline, BPH just underwent TURP on 5/28,UXLKGM course was complicated by urinary retention and hematuria, subsequently went home with a Foley catheter.  Was seen by urology Dr. Karsten Ro in follow-up and had his catheter removed a day prior to presentation. -Started having shaking chills, weakness, shortness of breath,patient and spouse live at an independent living facility.  He was evaluated by a nurse there who noted that his heart rate and blood pressure were significantly elevated.  He was sent to the emergency room to be evaluated. -Patient denies any cough congestion or shortness of breath, just had a single episode of dyspnea 1 day prior to presentation.He also denies urinary retention, abdominal pain, nausea, vomiting, diarrhea, and chest pain.  ED Course:He was found to be profoundly  hypoxic requiring nonrebreather mask initially,subsequently weaned down to 6 L O2,labs in the ED were notable for white count of 26,000,Urine was cloudy with large RBC, numerous white blood cells. In addition, CT chest was done which was negative for pulmonary embolism, there was debris, possible mucous within trachea.  Had a swallow evaluation by speech therapy which he passed.   Urine culture x2 did not grow any specific bacteria.  Blood cultures negative to date.  07/02/2018: Patient seen and examined at his bedside.  No acute events overnight.  He has no new complaints.  On the day of discharge, the patient was hemodynamically stable.  He will need to follow-up with his primary care provider and neurologist posthospitalization.  He will also need to take his medications as prescribed.   Hospital Course:  Active Problems:   CLL (chronic lymphocytic leukemia) (HCC)   Post-polio syndrome   BPH (benign prostatic hyperplasia)   Acute and chronic respiratory failure with hypercapnia (HCC)   Sepsis (HCC)  Resolved sepsis, suspect multifactorial secondary to UTI versus early HCAP Presented with WBC 35,000 from 26,000, tachypnea with respiration rate of 33, tachycardia with heart rate of 117, O2 sat 83% RA, procalcitonin 0.76. Recent TURP  Suspected Early HCAP evidenced by CXR and CT chest with RLL infiltrates vs atelectasis, mucus in trachea and acute hypoxia Also possible UTI with U/A wbc UA>50 However, urine culture suggest recollection x2 Blood cultures x2 no growth to date Completed 5 days of IV cefepime Complete antibiotics course with Levaquin 750 mg daily x5 days Passed swallow evaluation with low risk for aspiration done by speech therapy  Resolved acute hypoxic respiratory failure secondary  to HCAP vs atelectasis/partial right lower lobe collapse versus others Management as stated above  History of CLL -Followed by Dr. Alvy Bimler, currently on surveillance  History of BPH  with lower urinary tract symptoms -Recent TURP,complicated by hematuria and urinary retention -Catheter removed on 06/28/2018 -No evidence of urinary retention at this time -Followed by Dr. Kathie Rhodes -Continue tamsulosin -Continue Pyridium 3 times daily PRN  History of chronic CO2 retention -On BiPAP nightly, suspect this is secondary to post polio syndrome and scoliosis  Chronic diastolic CHF Last 2D echo done on 05/06/2018 revealed LVEF 65 to 70% with grade 1 diastolic dysfunction Implement strict I's and O's and daily weight Not on any specific cardiac medications at home  Ambulatory dysfunction Patient is wheelchair-bound Lives in an assisted living Social worker consulted for placement back to ALF   Code Status:Full code      Discharge Exam: BP (!) 103/51   Pulse 84   Temp 97.8 F (36.6 C) (Oral)   Resp 20   Ht 6' (1.829 m)   Wt 77.5 kg   SpO2 97%   BMI 23.17 kg/m  . General: 85 y.o. year-old male well developed well nourished in no acute distress.  Alert and oriented x3. . Cardiovascular: Regular rate and rhythm with no rubs or gallops.  No thyromegaly or JVD noted.   Marland Kitchen Respiratory: Clear to auscultation with no wheezes or rales. Good inspiratory effort. . Abdomen: Soft nontender nondistended with normal bowel sounds x4 quadrants. . Psychiatry: Mood is appropriate for condition and setting  Discharge Instructions You were cared for by a hospitalist during your hospital stay. If you have any questions about your discharge medications or the care you received while you were in the hospital after you are discharged, you can call the unit and asked to speak with the hospitalist on call if the hospitalist that took care of you is not available. Once you are discharged, your primary care physician will handle any further medical issues. Please note that NO REFILLS for any discharge medications will be authorized once you are discharged, as it is imperative  that you return to your primary care physician (or establish a relationship with a primary care physician if you do not have one) for your aftercare needs so that they can reassess your need for medications and monitor your lab values.   Allergies as of 07/02/2018      Reactions   Penicillins Rash   Has patient had a PCN reaction causing immediate rash, facial/tongue/throat swelling, SOB or lightheadedness with hypotension: Y Has patient had a PCN reaction causing severe rash involving mucus membranes or skin necrosis: N Has patient had a PCN reaction that required hospitalization: N Has patient had a PCN reaction occurring within the last 10 years: N If all of the above answers are "NO", then may proceed with Cephalosporin use.         Medication List    STOP taking these medications   ciprofloxacin 500 MG tablet Commonly known as:  CIPRO   HYDROcodone-acetaminophen 10-325 MG tablet Commonly known as:  NORCO     TAKE these medications   albuterol (2.5 MG/3ML) 0.083% nebulizer solution Commonly known as:  PROVENTIL Take 3 mLs (2.5 mg total) by nebulization every 6 (six) hours as needed for wheezing or shortness of breath. What changed:  when to take this   bisacodyl 5 MG EC tablet Commonly known as:  DULCOLAX Take 2 tablets (10 mg total) by mouth daily. What changed:  when to take this  reasons to take this   buPROPion 150 MG 12 hr tablet Commonly known as:  WELLBUTRIN SR Take 150 mg by mouth daily.   CALTRATE 600+D PO Take 1 tablet by mouth daily.   ergocalciferol 1.25 MG (50000 UT) capsule Commonly known as:  VITAMIN D2 Take 50,000 Units by mouth every 14 (fourteen) days. On Monday   levofloxacin 750 MG tablet Commonly known as:  LEVAQUIN Take 1 tablet (750 mg total) by mouth daily for 5 days. Start taking on:  July 03, 2018   LORazepam 0.5 MG tablet Commonly known as:  ATIVAN Take 1 mg by mouth at bedtime.   multivitamin-iron-minerals-folic acid  chewable tablet Chew 1 tablet by mouth daily.   OCUVITE PO Take 1 tablet by mouth daily.   phenazopyridine 200 MG tablet Commonly known as:  PYRIDIUM Take 1 tablet (200 mg total) by mouth 3 (three) times daily as needed for pain.   sertraline 50 MG tablet Commonly known as:  ZOLOFT Take 100 mg by mouth daily.   tamsulosin 0.4 MG Caps capsule Commonly known as:  FLOMAX Take 1 capsule (0.4 mg total) by mouth daily. What changed:    how much to take  when to take this      Allergies  Allergen Reactions  . Penicillins Rash    Has patient had a PCN reaction causing immediate rash, facial/tongue/throat swelling, SOB or lightheadedness with hypotension: Y Has patient had a PCN reaction causing severe rash involving mucus membranes or skin necrosis: N Has patient had a PCN reaction that required hospitalization: N Has patient had a PCN reaction occurring within the last 10 years: N If all of the above answers are "NO", then may proceed with Cephalosporin use.      Follow-up Information    Crist Infante, MD. Call in 1 day(s).   Specialty:  Internal Medicine Why:  Please call for a post hospital follow-up appointment. Contact information: [place] [id number] [phone]        Kathie Rhodes, MD. Call in 1 day(s).   Specialty:  Urology Why:  Please call for a post hospital follow-up appointment. Contact information: [place] [address] [phone]            The results of significant diagnostics from this hospitalization (including imaging, microbiology, ancillary and laboratory) are listed below for reference.    Significant Diagnostic Studies: Dg Chest 2 View  Result Date: 06/30/2018 CLINICAL DATA:  85 year old male with a history of hypoxia EXAM: CHEST - 2 VIEW COMPARISON:  06/29/2018, 06/28/2018, 05/23/2018, CT 06/29/2018 FINDINGS: Cardiomediastinal silhouette likely unchanged, with left rotation of the patient. Opacity at  the bilateral lung bases partially obscuring the hemidiaphragm bilaterally. Appearance similar to the plain film yesterday's date. No pneumothorax. Significant scoliotic curvature with associated degenerative changes again noted IMPRESSION: Bibasilar opacities, similar to the prior, likely representing a combination of aspiration pneumonitis/pneumonia, atelectasis, and/or pleural fluid. Electronically Signed   By: Corrie Mckusick D.O.   On: 06/30/2018 09:34   Ct Angio Chest Pe W/cm &/or Wo Cm  Result Date: 06/29/2018 CLINICAL DATA:  Shortness of breath and weakness EXAM: CT ANGIOGRAPHY CHEST WITH CONTRAST TECHNIQUE: Multidetector CT imaging of the chest was performed using the standard protocol during bolus administration of intravenous contrast. Multiplanar CT image reconstructions and MIPs were obtained to evaluate the vascular anatomy. CONTRAST:  113mL ISOVUE-370 IOPAMIDOL (ISOVUE-370) INJECTION 76% COMPARISON:  None. FINDINGS: Cardiovascular: --Pulmonary arteries: Contrast injection is  sufficient to demonstrate satisfactory opacification of the pulmonary arteries to the segmental level. There is no pulmonary embolus. The main pulmonary artery is within normal limits for size. --Aorta: Satisfactory opacification of the thoracic aorta. No aortic dissection or other acute aortic syndrome. Conventional 3 vessel aortic branching pattern. The aortic course and caliber are normal. There is mild aortic atherosclerosis. --Heart: Normal size. No pericardial effusion. Mediastinum/Nodes: No mediastinal, hilar or axillary lymphadenopathy. The visualized thyroid and thoracic esophageal course are unremarkable. Lungs/Pleura: There is debris within the upper trachea. There is partial right lower lobe collapse. Medial left basilar atelectasis. No pleural effusion. Upper Abdomen: Contrast bolus timing is not optimized for evaluation of the abdominal organs. There are stones within the gallbladder. Musculoskeletal: There is  severe right convex scoliosis of the thoracic spine with associated multilevel left-sided ankylosis. Review of the MIP images confirms the above findings. IMPRESSION: 1. No pulmonary embolus or acute aortic syndrome. 2. Debris within the upper trachea, likely mucus or aspirated material. Bibasilar atelectasis. Aortic Atherosclerosis (ICD10-I70.0). Electronically Signed   By: Ulyses Jarred M.D.   On: 06/29/2018 01:14   Dg Chest Portable 1 View  Result Date: 06/29/2018 CLINICAL DATA:  Sudden worsening of dyspnea. History of CLL, polio EXAM: PORTABLE CHEST 1 VIEW COMPARISON:  06/28/2018 CXR FINDINGS: AP portable semi upright view of the chest. Stable dextroconvex curvature of the thoracic spine. Normal cardiopericardial silhouette. Stable mediastinal contours. Hazy opacities at the lung bases are noted. Small layering effusions are not excluded. Interstitial edema is noted. IMPRESSION: Mild interstitial edema. Hazy stable opacities at the lung bases may reflect small pleural effusions and/or atelectasis. Electronically Signed   By: Ashley Royalty M.D.   On: 06/29/2018 15:54   Dg Chest Port 1 View  Result Date: 06/28/2018 CLINICAL DATA:  85 year old male with shortness of breath and weakness acute onset today. EXAM: PORTABLE CHEST 1 VIEW COMPARISON:  Chest radiographs 05/23/2018 and earlier. FINDINGS: Portable AP semi upright view at 2153 hours. Chronic severe scoliosis with associated bony deformity of the chest. Mediastinal contours and ventilation appears stable since last month. There is chronic veiling opacity at both lung bases along with dense retrocardiac opacity. No pneumothorax or new pulmonary opacity. Visualized tracheal air column is within normal limits. IMPRESSION: 1. Ventilation appears stable since last month. Probable chronic pleural effusions and lower lobe collapse. 2. Extensive scoliosis and bony deformity of the chest. Electronically Signed   By: Genevie Ann M.D.   On: 06/28/2018 22:35   Dg  Swallowing Func-speech Pathology  Result Date: 06/30/2018 Objective Swallowing Evaluation: Type of Study: Bedside Swallow Evaluation  Patient Details Name: JAIYON WANDER MRN: [id number] Date of Birth: [birth year] Today's Date: 06/30/2018 Time: SLP Start Time (ACUTE ONLY): 1252 -SLP Stop Time (ACUTE ONLY): 1305 SLP Time Calculation (min) (ACUTE ONLY): 13 min Past Medical History: Past Medical History: Diagnosis Date . Anxiety  . CLL (chronic lymphocytic leukemia) ([place]) 02/20/2014 . Osteoporosis  . Pneumonia  . Polio  . Scoliosis  Past Surgical History: Past Surgical History: Procedure Laterality Date . CATARACT EXTRACTION  09/2011  at [facility] . COLONOSCOPY  03/2007 . RETINAL DETACHMENT SURGERY  09/1995 . SPINAL FUSION  10/1952 . TONSILLECTOMY   . TRANSURETHRAL RESECTION OF PROSTATE N/A 06/10/2018  Procedure: TRANSURETHRAL RESECTION OF THE PROSTATE (TURP);  Surgeon: Kathie Rhodes, MD;  Location: [facility];  Service: Urology;  Laterality: N/A; HPI: 85 yo male with CLL, post-poliomyelitis and bilateral leg weakness adm to [facility] with productive cough increased chest congestion.  Was in the hospital in December with pna and February for elective prostate resection.   Current hospital coarse complicated by pt's respiratory difficulties, increased work of breathing and diffuse rhonci - requiring BVM and ED MD ordered Bipap and CXR.   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  PMH also + for scoliosis, BPH, osteoporosis, depression and anxiety. Pt has suffered falls and hit head on toilet 02/2018.   Pt CXR 06/29/2018 Mild interstitial edema, Hazy stable opacities at the lung bases may reflect small pleural effusions and/or atelectasis. Bibasilar opacities, similar to the prior, likely representing a pneumonitis/pneumonia, atelectasis, and/or aspiration or pleural fluid.  Swallow eval ordered.   MBS ordered in  lieu of clinical swallow evaluation.   Subjective: pt awake in chair Assessment / Plan / Recommendation CHL IP CLINICAL IMPRESSIONS 06/30/2018 Clinical Impression Pt with functional oropharyngeal swallow ability. NO aspiration observed and trace laryngeal penetration of thin only observed with head in chin tuck posture.  Cued cough did not clear mild penetrates.  Chin tuck posture tested due to pt having mild vallecular residuals to determine if would be preventative - however uncertain to its effectiveness and this posture allowed pharyngeal residuals to spill into open larynx.  Pt with only mild vallecular residuals with pudding and cracker without sensation.   This residual is WFL given pt's age, however due to his increased pulmonary risk from paralysis - recommend follow solids with liquids to faciliate clearance.  Pt educated to findings/recommendations during MBS, wife after in his room.  SLP also provided swallow precaution signs for reference.  Encouraged pt to sit fully upright as able to meals and 30 minutes after po.   SLP Visit Diagnosis Dysphagia, unspecified (R13.10) Attention and concentration deficit following -- Frontal lobe and executive function deficit following -- Impact on safety and function Mild aspiration risk   CHL IP TREATMENT RECOMMENDATION 06/30/2018 Treatment Recommendations Therapy as outlined in treatment plan below   Prognosis 06/30/2018 Prognosis for Safe Diet Advancement Good Barriers to Reach Goals -- Barriers/Prognosis Comment -- CHL IP DIET RECOMMENDATION 06/30/2018 SLP Diet Recommendations Regular solids;Thin liquid Liquid Administration via -- Medication Administration Whole meds with puree Compensations Slow rate;Small sips/bites;Follow solids with liquid Postural Changes Remain semi-upright after after feeds/meals (Comment);Seated upright at 90 degrees   CHL IP OTHER RECOMMENDATIONS 06/30/2018 Recommended Consults -- Oral Care Recommendations Oral care BID Other Recommendations  --   CHL IP FOLLOW UP RECOMMENDATIONS 04/22/2018 Follow up Recommendations None   CHL IP FREQUENCY AND DURATION 06/30/2018 Speech Therapy Frequency (ACUTE ONLY) min 1 x/week Treatment Duration 1 week      CHL IP ORAL PHASE 06/30/2018 Oral Phase WFL Oral - Pudding Teaspoon -- Oral - Pudding Cup -- Oral - Honey Teaspoon -- Oral - Honey Cup -- Oral - Nectar Teaspoon -- Oral - Nectar Cup -- Oral - Nectar Straw -- Oral - Thin Teaspoon -- Oral - Thin Cup -- Oral - Thin Straw -- Oral - Puree -- Oral - Mech Soft -- Oral - Regular -- Oral - Multi-Consistency -- Oral - Pill -- Oral Phase - Comment --  CHL IP PHARYNGEAL PHASE 06/30/2018 Pharyngeal Phase WFL Pharyngeal- Pudding Teaspoon -- Pharyngeal -- Pharyngeal- Pudding Cup -- Pharyngeal -- Pharyngeal- Honey Teaspoon -- Pharyngeal -- Pharyngeal- Honey Cup -- Pharyngeal -- Pharyngeal- Nectar Teaspoon -- Pharyngeal -- Pharyngeal- Nectar Cup -- Pharyngeal -- Pharyngeal- Nectar Straw WFL Pharyngeal -- Pharyngeal- Thin Teaspoon --  Pharyngeal -- Pharyngeal- Thin Cup -- Pharyngeal -- Pharyngeal- Thin Straw WFL Pharyngeal -- Pharyngeal- Puree WFL Pharyngeal -- Pharyngeal- Mechanical Soft -- Pharyngeal -- Pharyngeal- Regular WFL Pharyngeal -- Pharyngeal- Multi-consistency -- Pharyngeal -- Pharyngeal- Pill WFL Pharyngeal -- Pharyngeal Comment --  CHL IP CERVICAL ESOPHAGEAL PHASE 06/30/2018 Cervical Esophageal Phase WFL Pudding Teaspoon -- Pudding Cup -- Honey Teaspoon -- Honey Cup -- Nectar Teaspoon -- Nectar Cup -- Nectar Straw -- Thin Teaspoon -- Thin Cup -- Thin Straw -- Puree -- Mechanical Soft -- Regular -- Multi-consistency -- Pill -- Cervical Esophageal Comment -- Luanna Salk, MS [facility] SLP Acute Rehab Services Pager [phone] Office [phone] Macario Golds 06/30/2018, 2:19 PM               Microbiology: Recent Results (from the past 240 hour(s))  Culture, blood (routine x 2)     Status: None (Preliminary result)   Collection Time: 06/29/18  9:07 AM  Result Value  Ref Range Status   Specimen Description BLOOD LEFT HAND  Final   Special Requests   Final    BOTTLES DRAWN AEROBIC AND ANAEROBIC Blood Culture adequate volume Performed at [facility], [place] [address], [place], [place] [address]    Culture NO GROWTH 2 DAYS  Final   Report Status PENDING  Incomplete  Culture, Urine     Status: None   Collection Time: 06/29/18  9:07 AM  Result Value Ref Range Status   Specimen Description   Final    URINE, CLEAN CATCH Performed at [facility], [place] [address] [place], [place] [address]    Special Requests   Final    NONE Performed at [facility], [place] [address] [place], [place] [address]    Culture   Final    Multiple bacterial morphotypes present, none predominant. Suggest appropriate recollection if clinically indicated.   Report Status 06/30/2018 FINAL  Final  MRSA PCR Screening     Status: None   Collection Time: 06/29/18  5:08 PM  Result Value Ref Range Status   MRSA by PCR NEGATIVE NEGATIVE Final    Comment:        The GeneXpert MRSA Assay (FDA approved for NASAL specimens only), is one component of a comprehensive MRSA colonization surveillance program. It is not intended to diagnose MRSA infection nor to guide or monitor treatment for MRSA infections. Performed at [facility], [place] [address], [place], [place] [address]   Culture, Urine     Status: None   Collection Time: 06/30/18  5:50 PM  Result Value Ref Range Status   Specimen Description   Final    URINE, CLEAN CATCH Performed at [facility], [place] [address] [place], [place] [address]    Special Requests   Final    NONE Performed at [facility], [place] [address], [place], [place] [address]    Culture   Final    Multiple bacterial morphotypes present, none predominant. Suggest appropriate recollection if clinically indicated.   Report Status 07/01/2018 FINAL   Final     Labs: Basic Metabolic Panel: Recent Labs  Lab 06/28/18 2216 06/28/18 2222 06/28/18 2225 06/30/18 0309  NA 132*  --  133* 137  K 4.1  --  4.2 4.0  CL 97*  --   --  98  CO2 29  --   --  30  GLUCOSE 121*  --   --  133*  BUN 28*  --   --  23  CREATININE 0.44* 0.60*  --  0.48*  CALCIUM 8.7*  --   --  8.9   Liver Function Tests: Recent Labs  Lab 06/30/18 0309  AST 14*  ALT 10  ALKPHOS 78  BILITOT 0.5  PROT 6.1*  ALBUMIN 3.4*   No results for input(s): LIPASE, AMYLASE in the last 168 hours. No results for input(s): AMMONIA in the last 168 hours. CBC: Recent Labs  Lab 06/28/18 2216 06/28/18 2225 06/30/18 0309 07/01/18 0303  WBC 26.3*  --  35.4* 21.2*  NEUTROABS 18.4*  --   --  13.4*  HGB 11.1* 11.6* 11.6* 10.1*  HCT 35.6* 34.0* 38.1* 34.2*  MCV 101.1*  --  102.1* 104.3*  PLT 436*  --  413* 352   Cardiac Enzymes: No results for input(s): CKTOTAL, CKMB, CKMBINDEX, TROPONINI in the last 168 hours. BNP: BNP (last 3 results) Recent Labs    04/21/18 2243 05/03/18 1347 05/04/18 1207  BNP 25.9 83.9 64.1    ProBNP (last 3 results) No results for input(s): PROBNP in the last 8760 hours.  CBG: Recent Labs  Lab 06/28/18 2247  GLUCAP 99       Signed:  Kayleen Memos, MD [facility] 07/02/2018, 12:45 PM

## 2018-07-02 NOTE — Progress Notes (Signed)
RT Note: Pt. declined BiPAP this evening, made aware to notify if needed.

## 2018-07-02 NOTE — Discharge Instructions (Signed)
Hypoxia Hypoxia is a condition that happens when there is a lack of oxygen in the body's tissues and organs. When there is not enough oxygen, organs cannot work as they should. This causes serious problems throughout the body and in the brain. What are the causes? This condition may be caused by:  Exposure to high altitude.  A collapsed lung (pneumothorax).  Lung infection (pneumonia).  Lung injury.  Long-term (chronic) lung disease, such as COPD (chronic obstructive pulmonary disease).  Blood collecting in the chest cavity (hemothorax).  Food, saliva, or vomit getting into the airway (aspiration).  Reduced blood flow (ischemia).  Severe blood loss.  Slow or shallow breathing (hypoventilation).  Blood disorders, such as anemia.  Carbon monoxide poisoning.  The heart suddenly stopping (cardiac arrest).  Anesthetic medicines.  Drowning.  Choking. What are the signs or symptoms? Symptoms of this condition include:  Headache.  Fatigue.  Drowsiness.  Forgetfulness.  Nausea.  Confusion.  Shortness of breath.  Dizziness.  Bluish color of the skin, lips, or nail beds (cyanosis).  Change in consciousness or awareness. If hypoxia is not treated, it can lead to convulsions, loss of consciousness (coma), or brain damage. How is this diagnosed? This condition may be diagnosed based on:  A physical exam.  Blood tests.  A test that measures how much oxygen is in your blood (pulse oximetry). This is done with a sensor that is placed on your finger, toe, or earlobe.  Chest X-ray.  Tests to check your lung function (pulmonary function tests).  A test to check the electrical activity of your heart (electrocardiogram, ECG). You may have other tests to determine the cause of your hypoxia. How is this treated?  Treatment for this condition depends on what is causing the hypoxia. You will likely be treated with oxygen therapy. This may be done by giving you oxygen  through a face mask or through tubes in your nose. Your health care provider may also recommend other therapies to treat the underlying cause of your hypoxia. Follow these instructions at home:  Take over-the-counter and prescription medicines only as told by your health care provider.  Do not use any products that contain nicotine or tobacco, such as cigarettes and e-cigarettes. If you need help quitting, ask your health care provider.  Avoid secondhand smoke.  Work with your health care provider to manage any chronic conditions you have that may be causing hypoxia, such as COPD.  Keep all follow-up visits as told by your health care provider. This is important. Contact a health care provider if:  You have a fever.  You have trouble breathing, even after treatment.  You become extremely short of breath when you exercise. Get help right away if:  Your shortness of breath gets worse, especially with normal or very little activity.  Your skin, lips, or nail beds have a bluish color.  You become confused or you cannot think properly.  You have chest pain. Summary  Hypoxia is a condition that happens when there is a lack of oxygen in the body's tissues and organs.  If hypoxia is not treated, it can lead to convulsions, loss of consciousness (coma), or brain damage.  Symptoms of hypoxia can include a headache, shortness of breath, confusion, nausea, and a bluish skin color.  Hypoxia has many possible causes, including exposure to high altitude, carbon monoxide poisoning, or other health issues, such as blood disorders or cardiac arrest.  Hypoxia is usually treated with oxygen therapy. This information is not   intended to replace advice given to you by your health care provider. Make sure you discuss any questions you have with your health care provider. Document Released: 06/15/2016 Document Revised: 06/15/2016 Document Reviewed: 06/15/2016 Elsevier Interactive Patient Education   2019 Elsevier Inc.  

## 2018-07-02 NOTE — Progress Notes (Signed)
Pt is active with [facility] for [facility], [facility] aware of dc home today with HH. Jonnie Finner RN CCM Case Mgmt phone [phone]

## 2018-07-04 LAB — CULTURE, BLOOD (ROUTINE X 2)
Culture: NO GROWTH
Special Requests: ADEQUATE

## 2018-07-05 DIAGNOSIS — R338 Other retention of urine: Secondary | ICD-10-CM | POA: Diagnosis not present

## 2018-07-05 DIAGNOSIS — N401 Enlarged prostate with lower urinary tract symptoms: Secondary | ICD-10-CM | POA: Diagnosis not present

## 2018-08-04 DIAGNOSIS — C9100 Acute lymphoblastic leukemia not having achieved remission: Secondary | ICD-10-CM | POA: Diagnosis not present

## 2018-08-04 DIAGNOSIS — J9612 Chronic respiratory failure with hypercapnia: Secondary | ICD-10-CM | POA: Diagnosis not present

## 2018-08-04 DIAGNOSIS — N401 Enlarged prostate with lower urinary tract symptoms: Secondary | ICD-10-CM | POA: Diagnosis not present

## 2018-08-04 DIAGNOSIS — L988 Other specified disorders of the skin and subcutaneous tissue: Secondary | ICD-10-CM | POA: Diagnosis not present

## 2018-08-04 DIAGNOSIS — F0390 Unspecified dementia without behavioral disturbance: Secondary | ICD-10-CM | POA: Diagnosis not present

## 2018-08-04 DIAGNOSIS — G14 Postpolio syndrome: Secondary | ICD-10-CM | POA: Diagnosis not present

## 2018-08-04 DIAGNOSIS — G250 Essential tremor: Secondary | ICD-10-CM | POA: Diagnosis not present

## 2018-08-04 DIAGNOSIS — K5909 Other constipation: Secondary | ICD-10-CM | POA: Diagnosis not present

## 2018-09-02 ENCOUNTER — Ambulatory Visit: Payer: Medicare Other | Admitting: Pulmonary Disease

## 2018-09-20 DIAGNOSIS — R195 Other fecal abnormalities: Secondary | ICD-10-CM | POA: Diagnosis not present

## 2018-09-20 DIAGNOSIS — R351 Nocturia: Secondary | ICD-10-CM | POA: Diagnosis not present

## 2018-09-20 DIAGNOSIS — N401 Enlarged prostate with lower urinary tract symptoms: Secondary | ICD-10-CM | POA: Diagnosis not present

## 2018-09-23 DIAGNOSIS — G14 Postpolio syndrome: Secondary | ICD-10-CM | POA: Diagnosis not present

## 2018-09-23 DIAGNOSIS — Z87891 Personal history of nicotine dependence: Secondary | ICD-10-CM | POA: Diagnosis not present

## 2018-09-23 DIAGNOSIS — L89510 Pressure ulcer of right ankle, unstageable: Secondary | ICD-10-CM | POA: Diagnosis not present

## 2018-09-23 DIAGNOSIS — G250 Essential tremor: Secondary | ICD-10-CM | POA: Diagnosis not present

## 2018-09-23 DIAGNOSIS — C9100 Acute lymphoblastic leukemia not having achieved remission: Secondary | ICD-10-CM | POA: Diagnosis not present

## 2018-09-23 DIAGNOSIS — Z87440 Personal history of urinary (tract) infections: Secondary | ICD-10-CM | POA: Diagnosis not present

## 2018-09-23 DIAGNOSIS — L89890 Pressure ulcer of other site, unstageable: Secondary | ICD-10-CM | POA: Diagnosis not present

## 2018-09-27 DIAGNOSIS — G14 Postpolio syndrome: Secondary | ICD-10-CM | POA: Diagnosis not present

## 2018-09-27 DIAGNOSIS — L89510 Pressure ulcer of right ankle, unstageable: Secondary | ICD-10-CM | POA: Diagnosis not present

## 2018-09-27 DIAGNOSIS — C9100 Acute lymphoblastic leukemia not having achieved remission: Secondary | ICD-10-CM | POA: Diagnosis not present

## 2018-09-27 DIAGNOSIS — L89890 Pressure ulcer of other site, unstageable: Secondary | ICD-10-CM | POA: Diagnosis not present

## 2018-09-27 DIAGNOSIS — G250 Essential tremor: Secondary | ICD-10-CM | POA: Diagnosis not present

## 2018-09-27 DIAGNOSIS — Z87891 Personal history of nicotine dependence: Secondary | ICD-10-CM | POA: Diagnosis not present

## 2018-09-30 DIAGNOSIS — C9100 Acute lymphoblastic leukemia not having achieved remission: Secondary | ICD-10-CM | POA: Diagnosis not present

## 2018-09-30 DIAGNOSIS — L89510 Pressure ulcer of right ankle, unstageable: Secondary | ICD-10-CM | POA: Diagnosis not present

## 2018-09-30 DIAGNOSIS — Z87891 Personal history of nicotine dependence: Secondary | ICD-10-CM | POA: Diagnosis not present

## 2018-09-30 DIAGNOSIS — G14 Postpolio syndrome: Secondary | ICD-10-CM | POA: Diagnosis not present

## 2018-09-30 DIAGNOSIS — L89890 Pressure ulcer of other site, unstageable: Secondary | ICD-10-CM | POA: Diagnosis not present

## 2018-09-30 DIAGNOSIS — G250 Essential tremor: Secondary | ICD-10-CM | POA: Diagnosis not present

## 2018-10-06 DIAGNOSIS — G14 Postpolio syndrome: Secondary | ICD-10-CM | POA: Diagnosis not present

## 2018-10-06 DIAGNOSIS — C9100 Acute lymphoblastic leukemia not having achieved remission: Secondary | ICD-10-CM | POA: Diagnosis not present

## 2018-10-06 DIAGNOSIS — L89890 Pressure ulcer of other site, unstageable: Secondary | ICD-10-CM | POA: Diagnosis not present

## 2018-10-06 DIAGNOSIS — G250 Essential tremor: Secondary | ICD-10-CM | POA: Diagnosis not present

## 2018-10-06 DIAGNOSIS — L89510 Pressure ulcer of right ankle, unstageable: Secondary | ICD-10-CM | POA: Diagnosis not present

## 2018-10-06 DIAGNOSIS — Z87891 Personal history of nicotine dependence: Secondary | ICD-10-CM | POA: Diagnosis not present

## 2018-10-14 DIAGNOSIS — L89510 Pressure ulcer of right ankle, unstageable: Secondary | ICD-10-CM | POA: Diagnosis not present

## 2018-10-14 DIAGNOSIS — G14 Postpolio syndrome: Secondary | ICD-10-CM | POA: Diagnosis not present

## 2018-10-14 DIAGNOSIS — C9100 Acute lymphoblastic leukemia not having achieved remission: Secondary | ICD-10-CM | POA: Diagnosis not present

## 2018-10-14 DIAGNOSIS — Z87891 Personal history of nicotine dependence: Secondary | ICD-10-CM | POA: Diagnosis not present

## 2018-10-14 DIAGNOSIS — L89890 Pressure ulcer of other site, unstageable: Secondary | ICD-10-CM | POA: Diagnosis not present

## 2018-10-14 DIAGNOSIS — G250 Essential tremor: Secondary | ICD-10-CM | POA: Diagnosis not present

## 2018-10-21 DIAGNOSIS — L89510 Pressure ulcer of right ankle, unstageable: Secondary | ICD-10-CM | POA: Diagnosis not present

## 2018-10-21 DIAGNOSIS — Z87891 Personal history of nicotine dependence: Secondary | ICD-10-CM | POA: Diagnosis not present

## 2018-10-21 DIAGNOSIS — G250 Essential tremor: Secondary | ICD-10-CM | POA: Diagnosis not present

## 2018-10-21 DIAGNOSIS — G14 Postpolio syndrome: Secondary | ICD-10-CM | POA: Diagnosis not present

## 2018-10-21 DIAGNOSIS — C9100 Acute lymphoblastic leukemia not having achieved remission: Secondary | ICD-10-CM | POA: Diagnosis not present

## 2018-10-21 DIAGNOSIS — L89890 Pressure ulcer of other site, unstageable: Secondary | ICD-10-CM | POA: Diagnosis not present

## 2018-10-23 DIAGNOSIS — L89890 Pressure ulcer of other site, unstageable: Secondary | ICD-10-CM | POA: Diagnosis not present

## 2018-10-23 DIAGNOSIS — L89510 Pressure ulcer of right ankle, unstageable: Secondary | ICD-10-CM | POA: Diagnosis not present

## 2018-10-23 DIAGNOSIS — C9100 Acute lymphoblastic leukemia not having achieved remission: Secondary | ICD-10-CM | POA: Diagnosis not present

## 2018-10-23 DIAGNOSIS — Z87891 Personal history of nicotine dependence: Secondary | ICD-10-CM | POA: Diagnosis not present

## 2018-10-23 DIAGNOSIS — Z87440 Personal history of urinary (tract) infections: Secondary | ICD-10-CM | POA: Diagnosis not present

## 2018-10-23 DIAGNOSIS — G250 Essential tremor: Secondary | ICD-10-CM | POA: Diagnosis not present

## 2018-10-23 DIAGNOSIS — G14 Postpolio syndrome: Secondary | ICD-10-CM | POA: Diagnosis not present

## 2018-10-28 DIAGNOSIS — L89890 Pressure ulcer of other site, unstageable: Secondary | ICD-10-CM | POA: Diagnosis not present

## 2018-10-28 DIAGNOSIS — G250 Essential tremor: Secondary | ICD-10-CM | POA: Diagnosis not present

## 2018-10-28 DIAGNOSIS — C9100 Acute lymphoblastic leukemia not having achieved remission: Secondary | ICD-10-CM | POA: Diagnosis not present

## 2018-10-28 DIAGNOSIS — G14 Postpolio syndrome: Secondary | ICD-10-CM | POA: Diagnosis not present

## 2018-10-28 DIAGNOSIS — Z87891 Personal history of nicotine dependence: Secondary | ICD-10-CM | POA: Diagnosis not present

## 2018-10-28 DIAGNOSIS — L89510 Pressure ulcer of right ankle, unstageable: Secondary | ICD-10-CM | POA: Diagnosis not present

## 2018-11-05 DIAGNOSIS — Z87891 Personal history of nicotine dependence: Secondary | ICD-10-CM | POA: Diagnosis not present

## 2018-11-05 DIAGNOSIS — C9100 Acute lymphoblastic leukemia not having achieved remission: Secondary | ICD-10-CM | POA: Diagnosis not present

## 2018-11-05 DIAGNOSIS — G14 Postpolio syndrome: Secondary | ICD-10-CM | POA: Diagnosis not present

## 2018-11-05 DIAGNOSIS — G250 Essential tremor: Secondary | ICD-10-CM | POA: Diagnosis not present

## 2018-11-05 DIAGNOSIS — L89510 Pressure ulcer of right ankle, unstageable: Secondary | ICD-10-CM | POA: Diagnosis not present

## 2018-11-05 DIAGNOSIS — L89890 Pressure ulcer of other site, unstageable: Secondary | ICD-10-CM | POA: Diagnosis not present

## 2018-11-09 DIAGNOSIS — R7989 Other specified abnormal findings of blood chemistry: Secondary | ICD-10-CM | POA: Diagnosis not present

## 2018-11-09 DIAGNOSIS — N401 Enlarged prostate with lower urinary tract symptoms: Secondary | ICD-10-CM | POA: Diagnosis not present

## 2018-11-09 DIAGNOSIS — M810 Age-related osteoporosis without current pathological fracture: Secondary | ICD-10-CM | POA: Diagnosis not present

## 2018-11-10 DIAGNOSIS — Z961 Presence of intraocular lens: Secondary | ICD-10-CM | POA: Diagnosis not present

## 2018-11-10 DIAGNOSIS — H35353 Cystoid macular degeneration, bilateral: Secondary | ICD-10-CM | POA: Diagnosis not present

## 2018-11-10 DIAGNOSIS — C9100 Acute lymphoblastic leukemia not having achieved remission: Secondary | ICD-10-CM | POA: Diagnosis not present

## 2018-11-10 DIAGNOSIS — G250 Essential tremor: Secondary | ICD-10-CM | POA: Diagnosis not present

## 2018-11-10 DIAGNOSIS — H35373 Puckering of macula, bilateral: Secondary | ICD-10-CM | POA: Diagnosis not present

## 2018-11-10 DIAGNOSIS — Z87891 Personal history of nicotine dependence: Secondary | ICD-10-CM | POA: Diagnosis not present

## 2018-11-10 DIAGNOSIS — L89890 Pressure ulcer of other site, unstageable: Secondary | ICD-10-CM | POA: Diagnosis not present

## 2018-11-10 DIAGNOSIS — G14 Postpolio syndrome: Secondary | ICD-10-CM | POA: Diagnosis not present

## 2018-11-10 DIAGNOSIS — L89510 Pressure ulcer of right ankle, unstageable: Secondary | ICD-10-CM | POA: Diagnosis not present

## 2018-11-14 DIAGNOSIS — R82998 Other abnormal findings in urine: Secondary | ICD-10-CM | POA: Diagnosis not present

## 2018-11-16 DIAGNOSIS — F329 Major depressive disorder, single episode, unspecified: Secondary | ICD-10-CM | POA: Diagnosis not present

## 2018-11-16 DIAGNOSIS — C9100 Acute lymphoblastic leukemia not having achieved remission: Secondary | ICD-10-CM | POA: Diagnosis not present

## 2018-11-16 DIAGNOSIS — R05 Cough: Secondary | ICD-10-CM | POA: Diagnosis not present

## 2018-11-16 DIAGNOSIS — R2689 Other abnormalities of gait and mobility: Secondary | ICD-10-CM | POA: Diagnosis not present

## 2018-11-16 DIAGNOSIS — B372 Candidiasis of skin and nail: Secondary | ICD-10-CM | POA: Diagnosis not present

## 2018-11-16 DIAGNOSIS — G14 Postpolio syndrome: Secondary | ICD-10-CM | POA: Diagnosis not present

## 2018-11-16 DIAGNOSIS — M810 Age-related osteoporosis without current pathological fracture: Secondary | ICD-10-CM | POA: Diagnosis not present

## 2018-11-16 DIAGNOSIS — F0390 Unspecified dementia without behavioral disturbance: Secondary | ICD-10-CM | POA: Diagnosis not present

## 2018-11-16 DIAGNOSIS — D7589 Other specified diseases of blood and blood-forming organs: Secondary | ICD-10-CM | POA: Diagnosis not present

## 2018-11-16 DIAGNOSIS — Z1331 Encounter for screening for depression: Secondary | ICD-10-CM | POA: Diagnosis not present

## 2018-11-16 DIAGNOSIS — J9612 Chronic respiratory failure with hypercapnia: Secondary | ICD-10-CM | POA: Diagnosis not present

## 2018-11-16 DIAGNOSIS — G250 Essential tremor: Secondary | ICD-10-CM | POA: Diagnosis not present

## 2018-11-16 DIAGNOSIS — Z0000 Encounter for general adult medical examination without abnormal findings: Secondary | ICD-10-CM | POA: Diagnosis not present

## 2018-11-17 DIAGNOSIS — L89890 Pressure ulcer of other site, unstageable: Secondary | ICD-10-CM | POA: Diagnosis not present

## 2018-11-17 DIAGNOSIS — L89510 Pressure ulcer of right ankle, unstageable: Secondary | ICD-10-CM | POA: Diagnosis not present

## 2018-11-17 DIAGNOSIS — C9100 Acute lymphoblastic leukemia not having achieved remission: Secondary | ICD-10-CM | POA: Diagnosis not present

## 2018-11-17 DIAGNOSIS — G250 Essential tremor: Secondary | ICD-10-CM | POA: Diagnosis not present

## 2018-11-17 DIAGNOSIS — G14 Postpolio syndrome: Secondary | ICD-10-CM | POA: Diagnosis not present

## 2018-11-17 DIAGNOSIS — Z87891 Personal history of nicotine dependence: Secondary | ICD-10-CM | POA: Diagnosis not present

## 2018-12-19 ENCOUNTER — Ambulatory Visit (INDEPENDENT_AMBULATORY_CARE_PROVIDER_SITE_OTHER): Payer: Medicare Other | Admitting: Pulmonary Disease

## 2018-12-19 ENCOUNTER — Other Ambulatory Visit: Payer: Self-pay

## 2018-12-19 VITALS — BP 116/68 | HR 88 | Temp 98.0°F

## 2018-12-19 DIAGNOSIS — J9612 Chronic respiratory failure with hypercapnia: Secondary | ICD-10-CM | POA: Diagnosis not present

## 2018-12-19 NOTE — Patient Instructions (Addendum)
Chronic respiratory failure  No changes to current care  Continue using nebulizers as needed Continue using ventilator at night  We will see you back in the office in 6 months

## 2018-12-19 NOTE — Progress Notes (Signed)
Manuel Reeves    [id number]    02/05/1933  Primary Care Physician:Perini, Elta Guadeloupe, MD  Referring Physician: Crist Infante, [address] [place],  [place] [address]  Chief complaint:   Patient with a history of chronic respiratory failure Status post TURP procedure recently  HPI:  History of postpolio syndrome Muscle weakness over the last 10 years Has been relatively well with regards to his respiratory status  History of CLL hospitalized December 2019 with a pneumonia Has recovered fully  He has trilogy ventilator at home which he is using regularly, occasionally does have difficulty tolerating it but has been very compliant Continues to tolerate trilogy well  70 arterial blood gas which showed  Smoked in the remote past  He is wheelchair dependent  Outpatient Encounter Medications as of 12/19/2018  Medication Sig  . albuterol (PROVENTIL) (2.5 MG/3ML) 0.083% nebulizer solution Take 3 mLs (2.5 mg total) by nebulization every 6 (six) hours as needed for wheezing or shortness of breath. (Patient taking differently: Take 2.5 mg by nebulization 2 (two) times daily. )  . buPROPion (WELLBUTRIN SR) 150 MG 12 hr tablet Take 150 mg by mouth daily.   . Calcium Carbonate-Vitamin D (CALTRATE 600+D PO) Take 1 tablet by mouth daily.  . ergocalciferol (VITAMIN D2) 50000 units capsule Take 50,000 Units by mouth every 14 (fourteen) days. On Monday  . LORazepam (ATIVAN) 0.5 MG tablet Take 1 mg by mouth at bedtime.   . Multiple Vitamins-Minerals (OCUVITE PO) Take 1 tablet by mouth daily.  . multivitamin-iron-minerals-folic acid (CENTRUM) chewable tablet Chew 1 tablet by mouth daily.  . sertraline (ZOLOFT) 50 MG tablet Take 100 mg by mouth daily.   . bisacodyl (DULCOLAX) 5 MG EC tablet Take 2 tablets (10 mg total) by mouth daily. (Patient not taking: Reported on 12/19/2018)  . phenazopyridine (PYRIDIUM) 200 MG tablet Take 1 tablet (200 mg total) by mouth 3 (three) times daily as  needed for pain. (Patient not taking: Reported on 12/19/2018)  . tamsulosin (FLOMAX) 0.4 MG CAPS capsule Take 1 capsule (0.4 mg total) by mouth daily. (Patient not taking: Reported on 12/19/2018)   No facility-administered encounter medications on file as of 12/19/2018.     Allergies as of 12/19/2018 - Review Complete 12/19/2018  Allergen Reaction Noted  . Penicillins Rash 10/30/2011    Past Medical History:  Diagnosis Date  . Anxiety   . CLL (chronic lymphocytic leukemia) ([place]) 02/20/2014  . Osteoporosis   . Pneumonia   . Polio   . Scoliosis     Past Surgical History:  Procedure Laterality Date  . CATARACT EXTRACTION  09/2011   at [facility]  . COLONOSCOPY  03/2007  . RETINAL DETACHMENT SURGERY  09/1995  . SPINAL FUSION  10/1952  . TONSILLECTOMY    . TRANSURETHRAL RESECTION OF PROSTATE N/A 06/10/2018   Procedure: TRANSURETHRAL RESECTION OF THE PROSTATE (TURP);  Surgeon: Kathie Rhodes, MD;  Location: [facility];  Service: Urology;  Laterality: N/A;    Family History  Problem Relation Age of Onset  . Colon cancer Sister 78    Social History   Socioeconomic History  . Marital status: Married    Spouse name: Not on file  . Number of children: Not on file  . Years of education: Not on file  . Highest education level: Not on file  Occupational History  . Not on file  Social Needs  . Financial resource strain: Not hard at all  .  Food insecurity    Worry: Never true    Inability: Never true  . Transportation needs    Medical: No    Non-medical: No  Tobacco Use  . Smoking status: Former Research scientist (life sciences)  . Smokeless tobacco: Never Used  . Tobacco comment: Quit at age 61  Substance and Sexual Activity  . Alcohol use: Yes    Comment: occasional beer or wine manybe monthly  . Drug use: No  . Sexual activity: Not Currently  Lifestyle  . Physical activity    Days per week: 0 days    Minutes per session: 0 min  . Stress: To some extent  Relationships  . Social connections     Talks on phone: More than three times a week    Gets together: More than three times a week    Attends religious service: More than 4 times per year    Active member of club or organization: Yes    Attends meetings of clubs or organizations: More than 4 times per year    Relationship status: Married  . Intimate partner violence    Fear of current or ex partner: No    Emotionally abused: No    Physically abused: No    Forced sexual activity: No  Other Topics Concern  . Not on file  Social History Narrative   .     Review of Systems  Constitutional: Negative.   HENT: Negative.   Eyes: Negative.   Respiratory: Negative for cough and shortness of breath.   Cardiovascular: Negative.   Gastrointestinal: Negative.   All other systems reviewed and are negative.   Vitals:   12/19/18 1450 12/19/18 1451  BP:  116/68  Pulse:  88  Temp: 98 F (36.7 C)   SpO2:  98%     Physical Exam  Constitutional: He appears well-developed and well-nourished.  HENT:  Head: Normocephalic and atraumatic.  Eyes: Pupils are equal, round, and reactive to light. Conjunctivae and EOM are normal. Right eye exhibits no discharge. Left eye exhibits no discharge.  Neck: Normal range of motion. Neck supple. No tracheal deviation present. No thyromegaly present.  Cardiovascular: Normal rate and regular rhythm.  Pulmonary/Chest: Effort normal and breath sounds normal. No respiratory distress. He has no wheezes. He has no rales.  Abdominal: Soft. Bowel sounds are normal.  Musculoskeletal:     Comments: Wheelchair borne   Data Reviewed: ABG on 05/24/2018-7.42/40 6/71 Chest x-ray on 05/23/2018-atelectasis at the bases-improving from prior  Assessment:   Chronic respiratory failure  Post polio syndrome with hypoventilation  Chronic atelectasis  Plan/Recommendations:  Continue trilogy vent at night  Encouraged to call with any significant concerns I will see him back in the office in about 6 months    Sherrilyn Rist MD [facility] 12/19/2018, 3:10 PM  CC: Crist Infante, MD

## 2019-02-02 DIAGNOSIS — Z23 Encounter for immunization: Secondary | ICD-10-CM | POA: Diagnosis not present

## 2019-03-21 DIAGNOSIS — F329 Major depressive disorder, single episode, unspecified: Secondary | ICD-10-CM | POA: Diagnosis not present

## 2019-03-21 DIAGNOSIS — B372 Candidiasis of skin and nail: Secondary | ICD-10-CM | POA: Diagnosis not present

## 2019-03-21 DIAGNOSIS — C9100 Acute lymphoblastic leukemia not having achieved remission: Secondary | ICD-10-CM | POA: Diagnosis not present

## 2019-03-21 DIAGNOSIS — R339 Retention of urine, unspecified: Secondary | ICD-10-CM | POA: Diagnosis not present

## 2019-03-21 DIAGNOSIS — L989 Disorder of the skin and subcutaneous tissue, unspecified: Secondary | ICD-10-CM | POA: Diagnosis not present

## 2019-03-21 DIAGNOSIS — J9612 Chronic respiratory failure with hypercapnia: Secondary | ICD-10-CM | POA: Diagnosis not present

## 2019-03-21 DIAGNOSIS — K5900 Constipation, unspecified: Secondary | ICD-10-CM | POA: Diagnosis not present

## 2019-03-21 DIAGNOSIS — F0390 Unspecified dementia without behavioral disturbance: Secondary | ICD-10-CM | POA: Diagnosis not present

## 2019-03-21 DIAGNOSIS — G14 Postpolio syndrome: Secondary | ICD-10-CM | POA: Diagnosis not present

## 2019-04-05 DIAGNOSIS — R600 Localized edema: Secondary | ICD-10-CM | POA: Diagnosis not present

## 2019-04-05 DIAGNOSIS — M76899 Other specified enthesopathies of unspecified lower limb, excluding foot: Secondary | ICD-10-CM | POA: Diagnosis not present

## 2019-05-15 DIAGNOSIS — Z23 Encounter for immunization: Secondary | ICD-10-CM | POA: Diagnosis not present

## 2019-05-31 ENCOUNTER — Telehealth: Payer: Self-pay | Admitting: Pulmonary Disease

## 2019-05-31 NOTE — Telephone Encounter (Signed)
Contact DME  Compliance data reviewed compliance falling off a little bit  Follow-up with patient to find out if he needs any assistance

## 2019-06-02 NOTE — Telephone Encounter (Signed)
Called spoke with patient's spouse Manuel Reeves to discuss compliance report from patient's Trilogy vent.  Per Manuel Reeves, patient did go 2 nights this week without wearing the Trilogy but wore it every night prior to and hence.  Manuel Reeves reported that pt did not wear the vent those 2 nights "because he honestly didn't feel like he needed it" but she did notice some increased shakiness in pt afterward and will ensure that he wears it every night moving forward.    The nurse that comes to patient's home periodically to check the vent/settings, etc did provide pt with a small nasal mask instead of the larger one that he had been wearing, and sometimes pt has to have vasoline around his nose because of the tight fit.  But otherwise pt likes the smaller mask.  Did encourage Manuel Reeves to please call the office should they need any additional assistance with mask/fit.  Nothing further needed; will sign and route to Dr Jenetta Downer to make him aware.

## 2019-06-02 NOTE — Telephone Encounter (Signed)
Manuel Reeves wife returning phone call.  Manuel Reeves phone number is [phone] h or [phone] c.

## 2019-06-02 NOTE — Telephone Encounter (Signed)
lmtcb for pt/wife. Routing back to [place] for follow-up as this is not a triage-generated message.

## 2019-06-02 NOTE — Telephone Encounter (Signed)
Manuel Reeves is returning phone call.  Manuel Reeves phone number is [phone].  May leave detailed message.

## 2019-06-02 NOTE — Telephone Encounter (Signed)
ATC Patient.  Called home number and someone answered, but hung up x's 2. Called mobile number and LMTCB.

## 2019-06-05 NOTE — Telephone Encounter (Signed)
Aware. thanks

## 2019-06-12 DIAGNOSIS — Z23 Encounter for immunization: Secondary | ICD-10-CM | POA: Diagnosis not present

## 2019-07-24 DIAGNOSIS — F329 Major depressive disorder, single episode, unspecified: Secondary | ICD-10-CM | POA: Diagnosis not present

## 2019-07-24 DIAGNOSIS — F0390 Unspecified dementia without behavioral disturbance: Secondary | ICD-10-CM | POA: Diagnosis not present

## 2019-07-24 DIAGNOSIS — Z1331 Encounter for screening for depression: Secondary | ICD-10-CM | POA: Diagnosis not present

## 2019-07-24 DIAGNOSIS — J9612 Chronic respiratory failure with hypercapnia: Secondary | ICD-10-CM | POA: Diagnosis not present

## 2019-07-24 DIAGNOSIS — K219 Gastro-esophageal reflux disease without esophagitis: Secondary | ICD-10-CM | POA: Diagnosis not present

## 2019-07-24 DIAGNOSIS — K5900 Constipation, unspecified: Secondary | ICD-10-CM | POA: Diagnosis not present

## 2019-07-24 DIAGNOSIS — G250 Essential tremor: Secondary | ICD-10-CM | POA: Diagnosis not present

## 2019-07-24 DIAGNOSIS — C9100 Acute lymphoblastic leukemia not having achieved remission: Secondary | ICD-10-CM | POA: Diagnosis not present

## 2019-07-24 DIAGNOSIS — R600 Localized edema: Secondary | ICD-10-CM | POA: Diagnosis not present

## 2019-08-07 DIAGNOSIS — N390 Urinary tract infection, site not specified: Secondary | ICD-10-CM | POA: Diagnosis not present

## 2019-08-07 DIAGNOSIS — M7021 Olecranon bursitis, right elbow: Secondary | ICD-10-CM | POA: Diagnosis not present

## 2019-08-07 DIAGNOSIS — Z7189 Other specified counseling: Secondary | ICD-10-CM | POA: Diagnosis not present

## 2019-08-07 DIAGNOSIS — N401 Enlarged prostate with lower urinary tract symptoms: Secondary | ICD-10-CM | POA: Diagnosis not present

## 2019-08-07 DIAGNOSIS — R319 Hematuria, unspecified: Secondary | ICD-10-CM | POA: Diagnosis not present

## 2019-08-16 DIAGNOSIS — R310 Gross hematuria: Secondary | ICD-10-CM | POA: Diagnosis not present

## 2019-08-16 DIAGNOSIS — N400 Enlarged prostate without lower urinary tract symptoms: Secondary | ICD-10-CM | POA: Diagnosis not present

## 2019-08-18 DIAGNOSIS — L84 Corns and callosities: Secondary | ICD-10-CM | POA: Diagnosis not present

## 2019-08-18 DIAGNOSIS — L602 Onychogryphosis: Secondary | ICD-10-CM | POA: Diagnosis not present

## 2019-08-18 DIAGNOSIS — Q6689 Other  specified congenital deformities of feet: Secondary | ICD-10-CM | POA: Diagnosis not present

## 2019-08-31 DIAGNOSIS — M62541 Muscle wasting and atrophy, not elsewhere classified, right hand: Secondary | ICD-10-CM | POA: Diagnosis not present

## 2019-08-31 DIAGNOSIS — M7021 Olecranon bursitis, right elbow: Secondary | ICD-10-CM | POA: Diagnosis not present

## 2019-08-31 DIAGNOSIS — M19021 Primary osteoarthritis, right elbow: Secondary | ICD-10-CM | POA: Diagnosis not present

## 2019-09-07 DIAGNOSIS — M7021 Olecranon bursitis, right elbow: Secondary | ICD-10-CM | POA: Diagnosis not present

## 2019-09-28 DIAGNOSIS — M7021 Olecranon bursitis, right elbow: Secondary | ICD-10-CM | POA: Diagnosis not present

## 2019-09-28 DIAGNOSIS — M25521 Pain in right elbow: Secondary | ICD-10-CM | POA: Diagnosis not present

## 2019-10-28 IMAGING — CT CT ANGIO CHEST
2 of 6 series · 18 of 46 positions shown · IV contrast (iopamidol)
Comparison: None.

CLINICAL DATA: Shortness of breath and weakness

EXAM:
CT ANGIOGRAPHY CHEST WITH CONTRAST
TECHNIQUE: Multidetector CT imaging of the chest was performed using the
standard protocol during bolus administration of intravenous
contrast. Multiplanar CT image reconstructions and MIPs were
obtained to evaluate the vascular anatomy.
CONTRAST:  100mL [id number] IOPAMIDOL ([id number]) INJECTION 76%

[Series 6: thins · axial · 0.66mm/px · z∈[+1277,+1550]mm · 15 of 299 slices shown]
[im 13/299  lung]
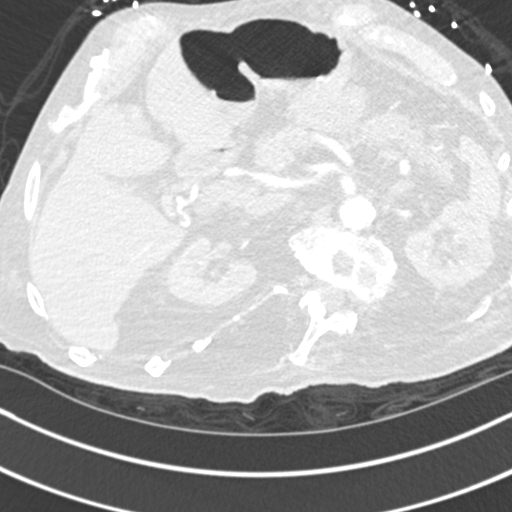
[im 39/299  soft-tissue]
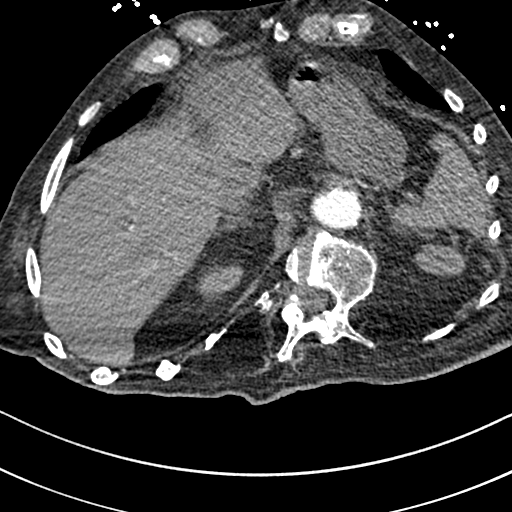
[im 52/299  lung]
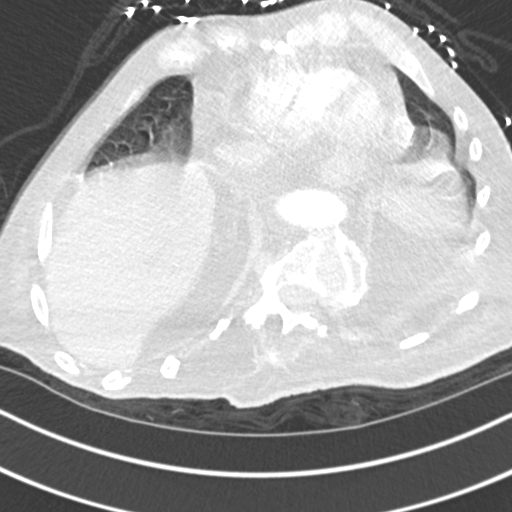
[im 78/299  soft-tissue]
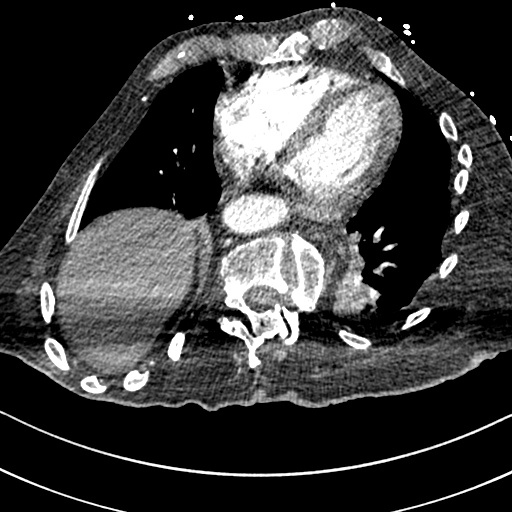
[im 91/299  lung]
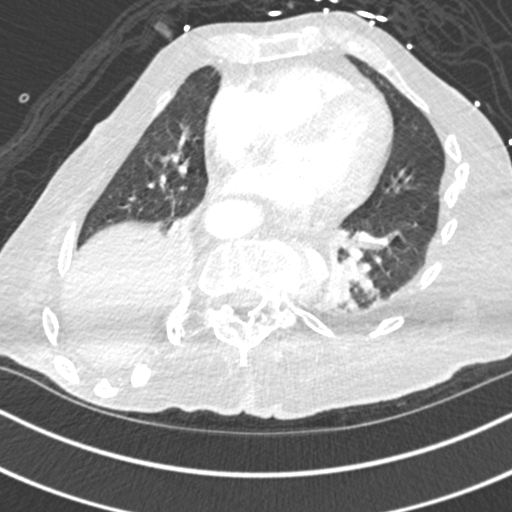
[im 117/299  soft-tissue]
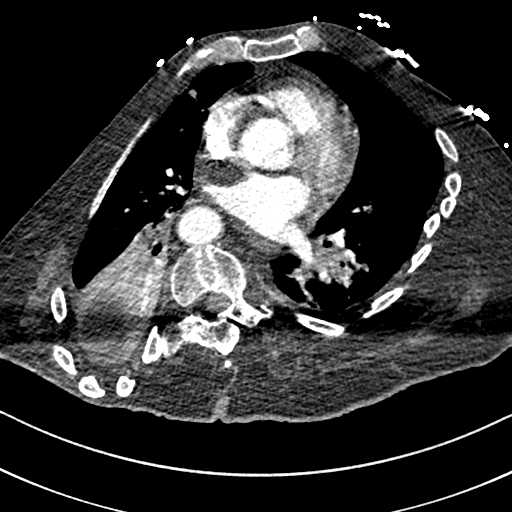
[im 130/299  lung]
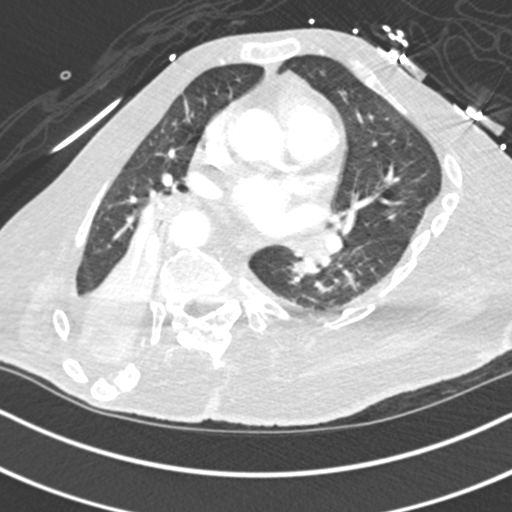
[im 156/299  soft-tissue]
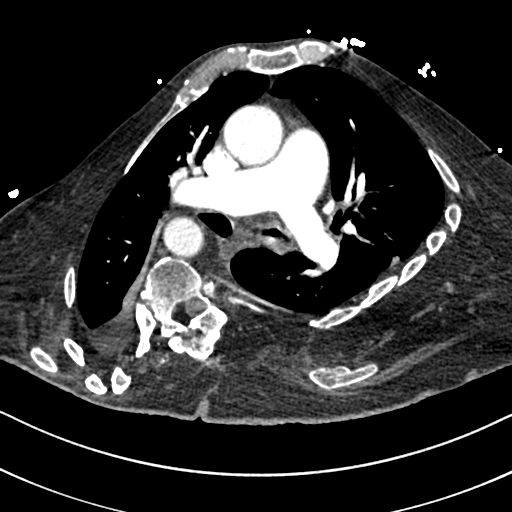
[im 169/299  lung]
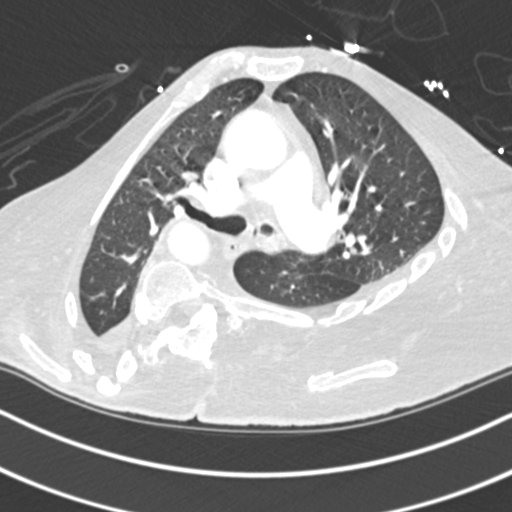
[im 182/299  soft-tissue]
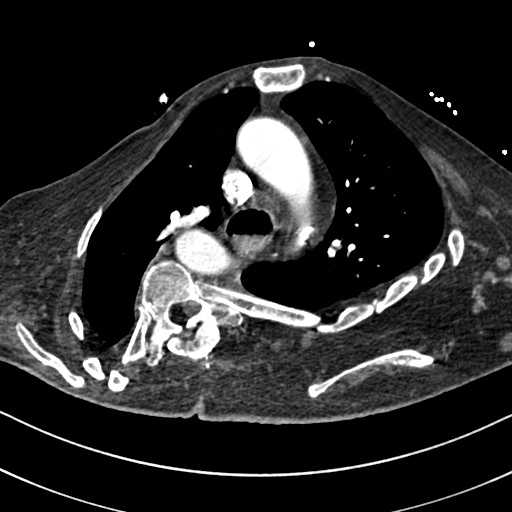
[im 208/299  lung]
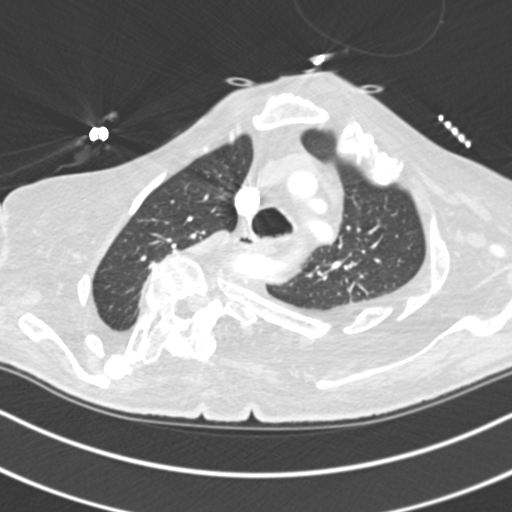
[im 221/299  soft-tissue]
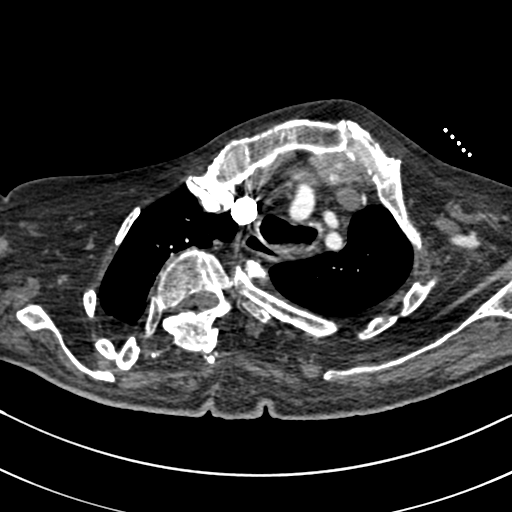
[im 247/299  lung]
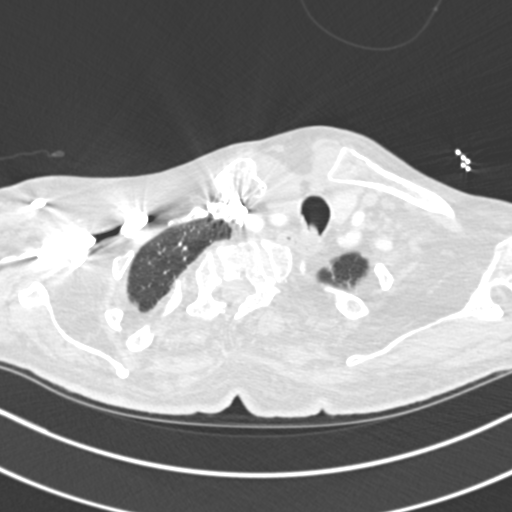
[im 260/299  soft-tissue]
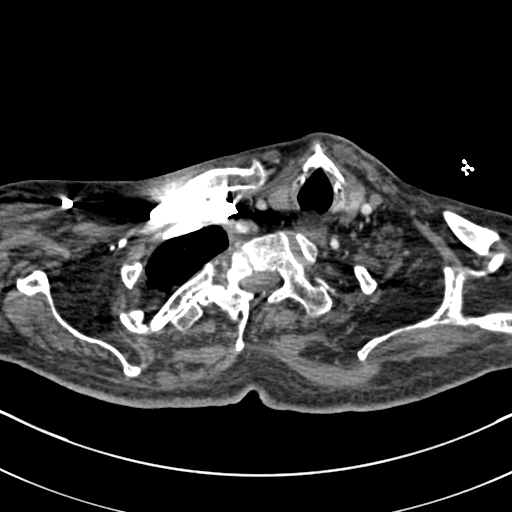
[im 286/299  lung]
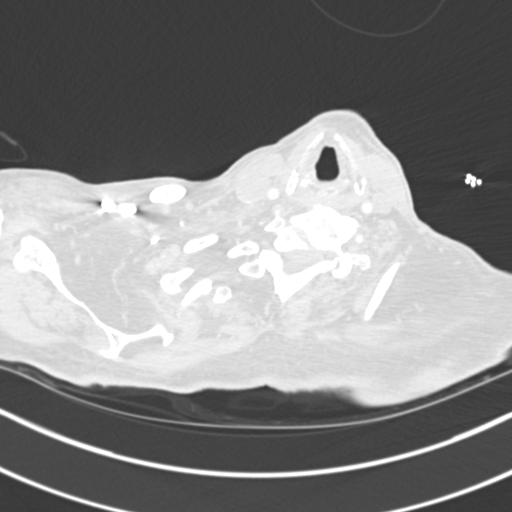

[Series 8: coronal mpr · coronal · 0.62mm/px · 3 of 138 slices shown]
[im 35/138  soft-tissue]
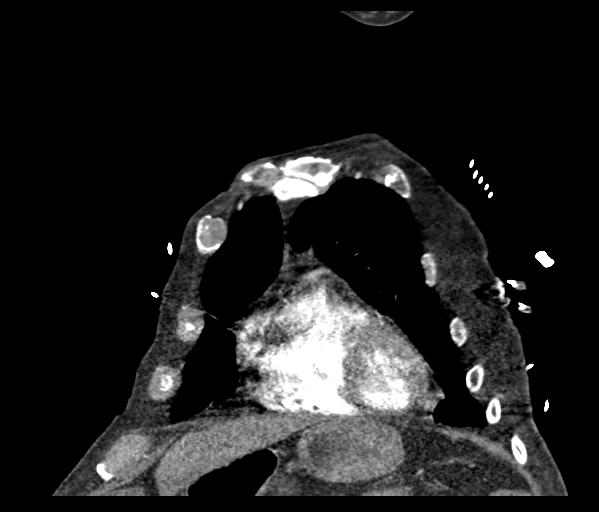
[im 69/138  soft-tissue]
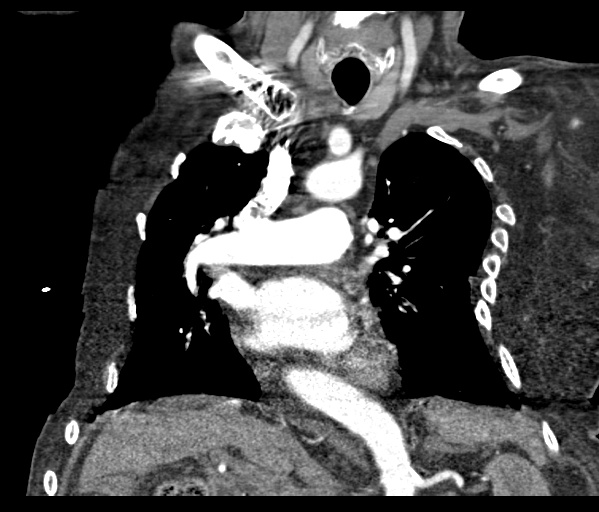
[im 103/138  soft-tissue]
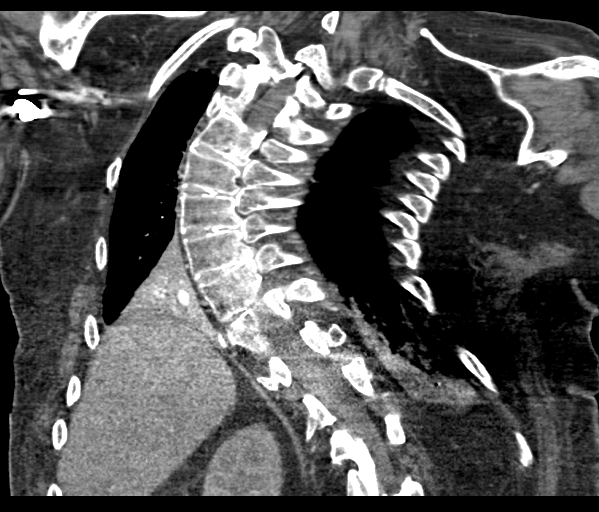

[18 of 46 positions shown; findings below may reference images not displayed]

FINDINGS: Cardiovascular:

--Pulmonary arteries: Contrast injection is sufficient to
demonstrate satisfactory opacification of the pulmonary arteries to
the segmental level. There is no pulmonary embolus. The main
pulmonary artery is within normal limits for size.

--Aorta: Satisfactory opacification of the thoracic aorta. No aortic
dissection or other acute aortic syndrome. Conventional 3 vessel
aortic branching pattern. The aortic course and caliber are normal.
There is mild aortic atherosclerosis.

--Heart: Normal size. No pericardial effusion.

Mediastinum/Nodes: No mediastinal, hilar or axillary
lymphadenopathy. The visualized thyroid and thoracic esophageal
course are unremarkable.

Lungs/Pleura: There is debris within the upper trachea. There is
partial right lower lobe collapse. Medial left basilar atelectasis.
No pleural effusion.

Upper Abdomen: Contrast bolus timing is not optimized for evaluation
of the abdominal organs. There are stones within the gallbladder.

Musculoskeletal: There is severe right convex scoliosis of the
thoracic spine with associated multilevel left-sided ankylosis.

Review of the MIP images confirms the above findings.
IMPRESSION: 1. No pulmonary embolus or acute aortic syndrome.
2. Debris within the upper trachea, likely mucus or aspirated
material. Bibasilar atelectasis.

Aortic Atherosclerosis ([id number]).

## 2019-10-28 IMAGING — DX DG CHEST 1V PORT
2 series · 2 of 2 positions shown · non-contrast
Comparison: 06/28/2018 CXR

CLINICAL DATA: Sudden worsening of dyspnea. History of CLL, polio

EXAM:
PORTABLE CHEST 1 VIEW

[chest ap (1 of 2)]
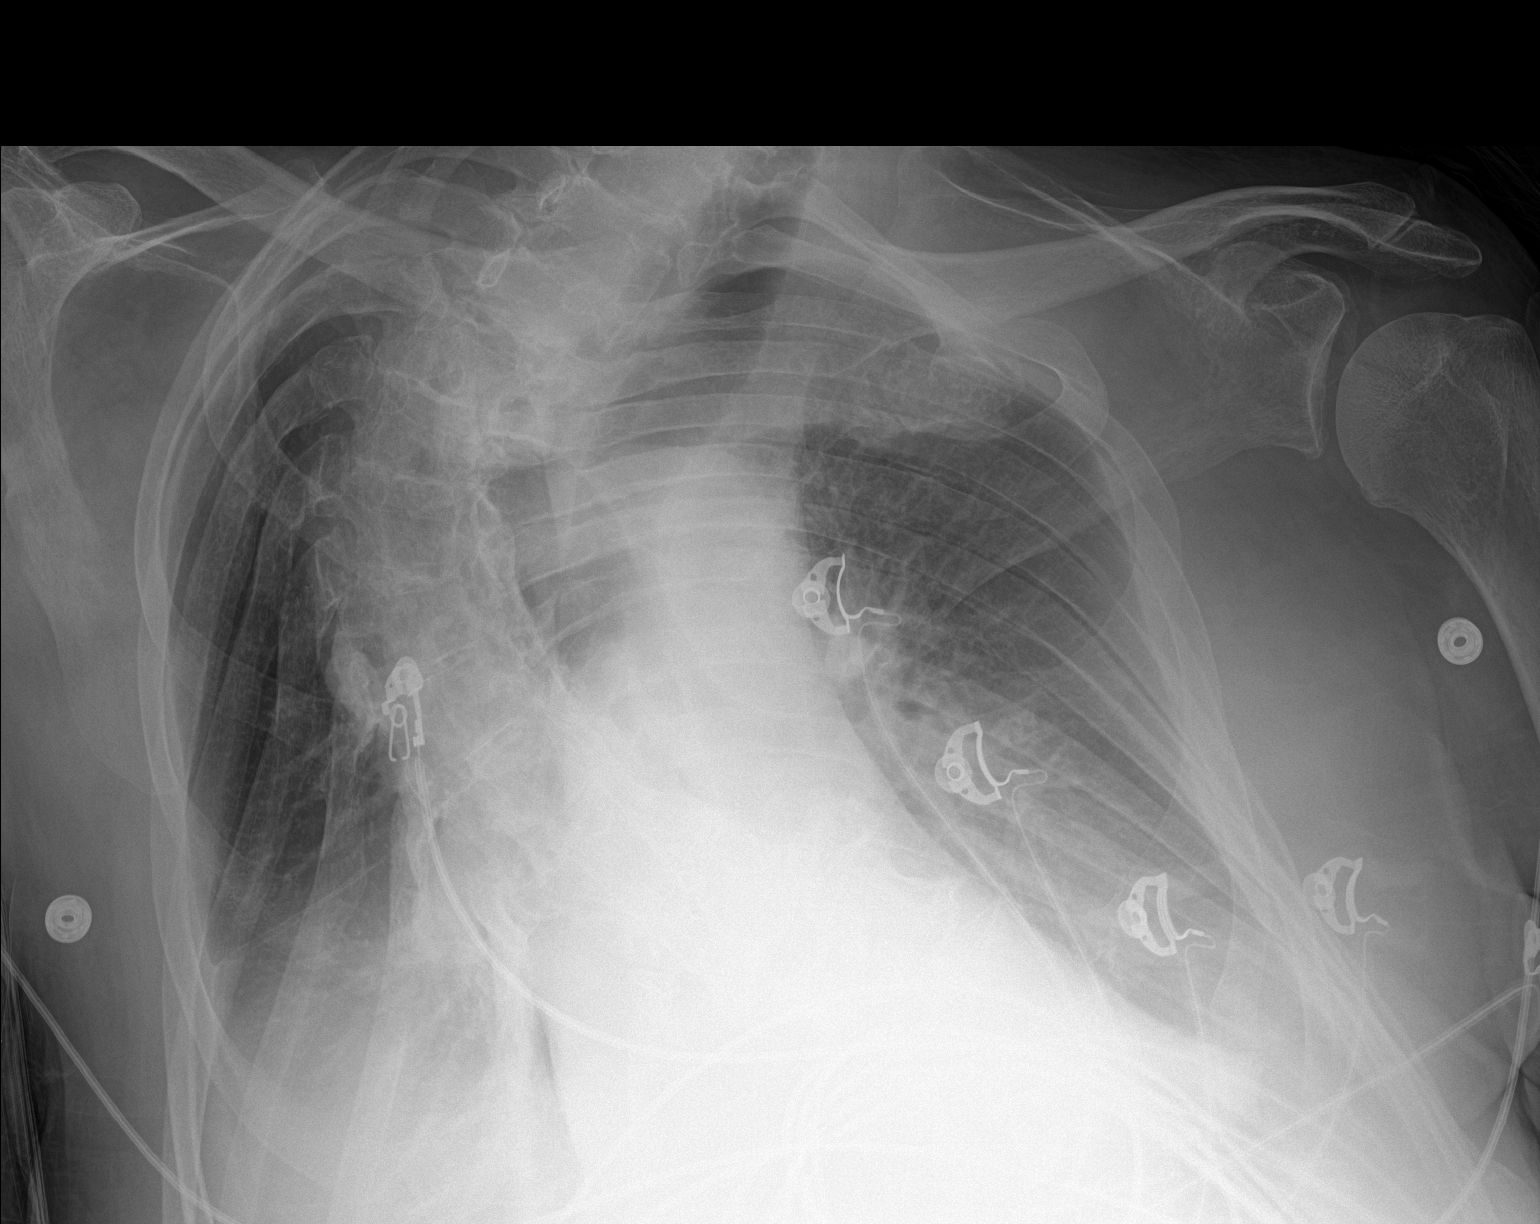

[chest ap (2 of 2)]
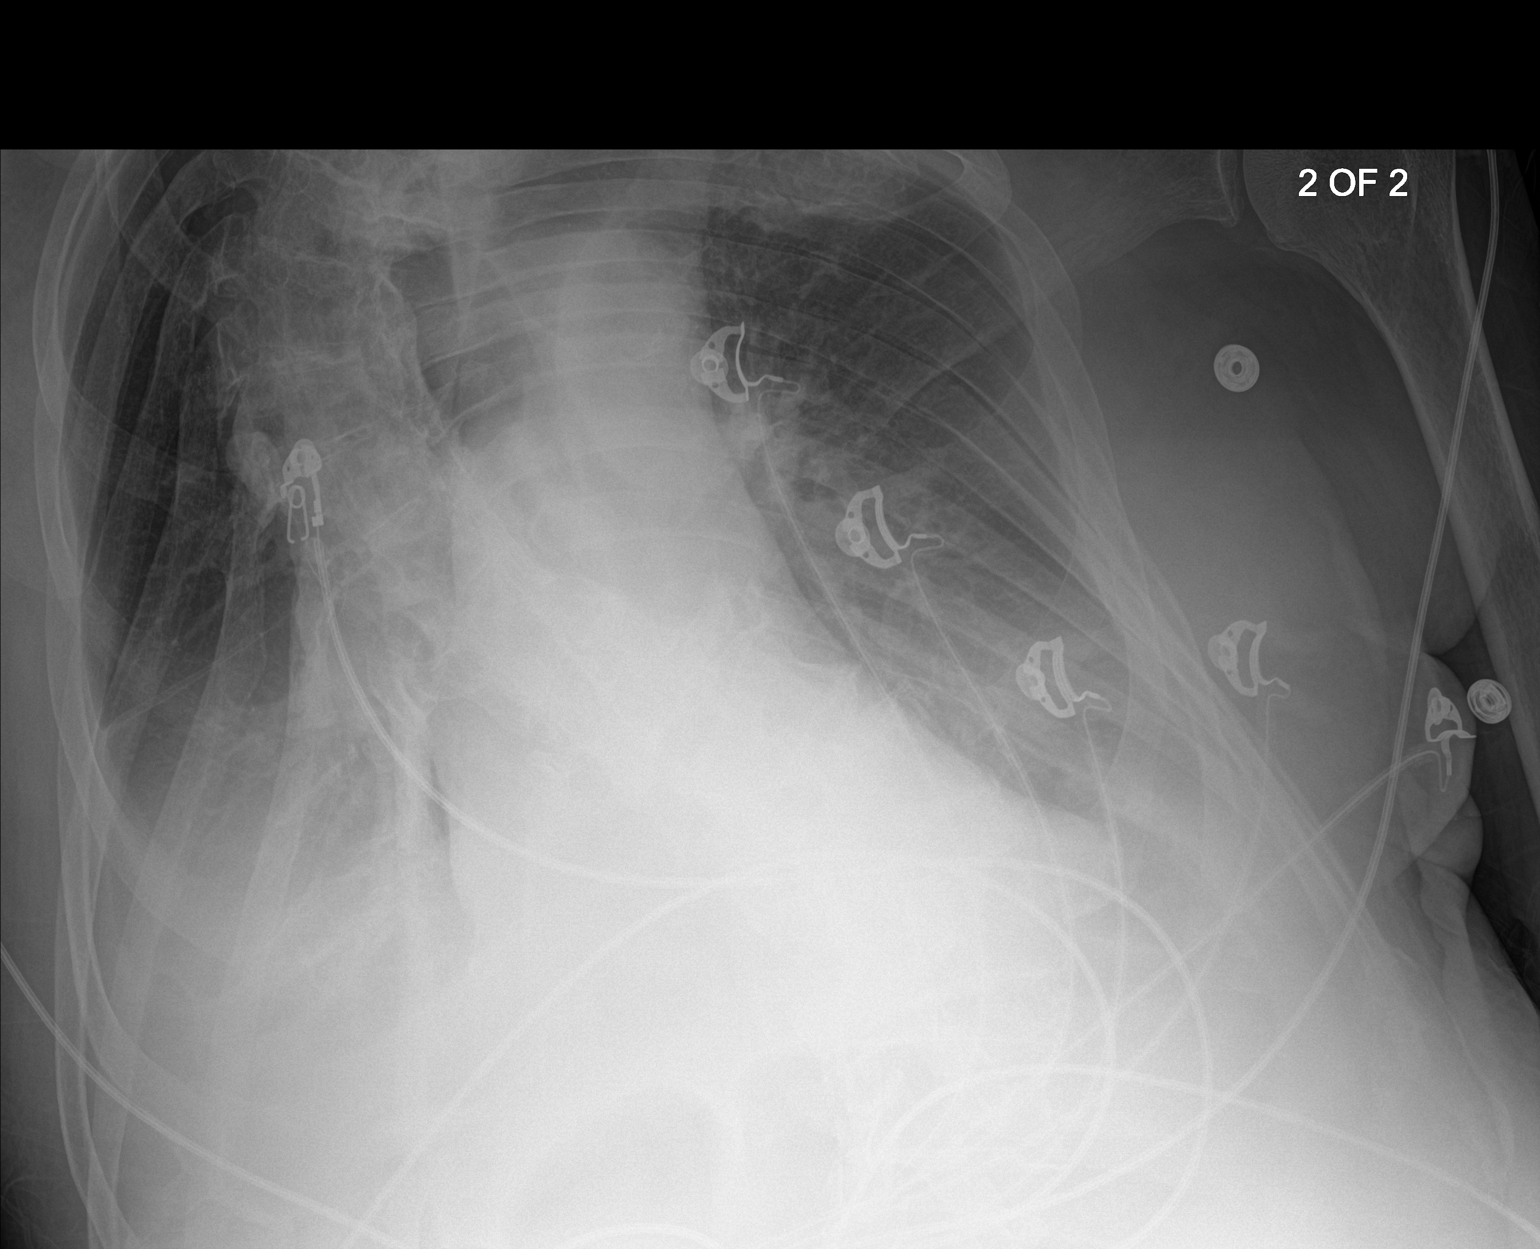

[2 of 2 positions shown; findings below may reference images not displayed]

FINDINGS: AP portable semi upright view of the chest. Stable dextroconvex
curvature of the thoracic spine. Normal cardiopericardial
silhouette. Stable mediastinal contours. Hazy opacities at the lung
bases are noted. Small layering effusions are not excluded.
Interstitial edema is noted.
IMPRESSION: Mild interstitial edema. Hazy stable opacities at the lung bases may
reflect small pleural effusions and/or atelectasis.

## 2019-12-18 DIAGNOSIS — Z125 Encounter for screening for malignant neoplasm of prostate: Secondary | ICD-10-CM | POA: Diagnosis not present

## 2019-12-18 DIAGNOSIS — R7989 Other specified abnormal findings of blood chemistry: Secondary | ICD-10-CM | POA: Diagnosis not present

## 2019-12-25 DIAGNOSIS — Z1331 Encounter for screening for depression: Secondary | ICD-10-CM | POA: Diagnosis not present

## 2019-12-25 DIAGNOSIS — G14 Postpolio syndrome: Secondary | ICD-10-CM | POA: Diagnosis not present

## 2019-12-25 DIAGNOSIS — H3320 Serous retinal detachment, unspecified eye: Secondary | ICD-10-CM | POA: Diagnosis not present

## 2019-12-25 DIAGNOSIS — F410 Panic disorder [episodic paroxysmal anxiety] without agoraphobia: Secondary | ICD-10-CM | POA: Diagnosis not present

## 2019-12-25 DIAGNOSIS — R2689 Other abnormalities of gait and mobility: Secondary | ICD-10-CM | POA: Diagnosis not present

## 2019-12-25 DIAGNOSIS — M419 Scoliosis, unspecified: Secondary | ICD-10-CM | POA: Diagnosis not present

## 2019-12-25 DIAGNOSIS — C9110 Chronic lymphocytic leukemia of B-cell type not having achieved remission: Secondary | ICD-10-CM | POA: Diagnosis not present

## 2019-12-25 DIAGNOSIS — L989 Disorder of the skin and subcutaneous tissue, unspecified: Secondary | ICD-10-CM | POA: Diagnosis not present

## 2019-12-25 DIAGNOSIS — G250 Essential tremor: Secondary | ICD-10-CM | POA: Diagnosis not present

## 2019-12-25 DIAGNOSIS — Z0000 Encounter for general adult medical examination without abnormal findings: Secondary | ICD-10-CM | POA: Diagnosis not present

## 2019-12-25 DIAGNOSIS — D72820 Lymphocytosis (symptomatic): Secondary | ICD-10-CM | POA: Diagnosis not present

## 2019-12-25 DIAGNOSIS — J9612 Chronic respiratory failure with hypercapnia: Secondary | ICD-10-CM | POA: Diagnosis not present

## 2019-12-25 DIAGNOSIS — R82998 Other abnormal findings in urine: Secondary | ICD-10-CM | POA: Diagnosis not present

## 2019-12-28 DIAGNOSIS — H26492 Other secondary cataract, left eye: Secondary | ICD-10-CM | POA: Diagnosis not present

## 2019-12-28 DIAGNOSIS — H35373 Puckering of macula, bilateral: Secondary | ICD-10-CM | POA: Diagnosis not present

## 2019-12-28 DIAGNOSIS — H35353 Cystoid macular degeneration, bilateral: Secondary | ICD-10-CM | POA: Diagnosis not present

## 2019-12-28 DIAGNOSIS — Z961 Presence of intraocular lens: Secondary | ICD-10-CM | POA: Diagnosis not present

## 2020-01-03 DIAGNOSIS — Z1152 Encounter for screening for COVID-19: Secondary | ICD-10-CM | POA: Diagnosis not present

## 2020-01-03 DIAGNOSIS — J189 Pneumonia, unspecified organism: Secondary | ICD-10-CM | POA: Diagnosis not present

## 2020-01-03 DIAGNOSIS — R350 Frequency of micturition: Secondary | ICD-10-CM | POA: Diagnosis not present

## 2020-01-03 DIAGNOSIS — N390 Urinary tract infection, site not specified: Secondary | ICD-10-CM | POA: Diagnosis not present

## 2020-01-03 DIAGNOSIS — R05 Cough: Secondary | ICD-10-CM | POA: Diagnosis not present

## 2020-01-05 ENCOUNTER — Emergency Department (HOSPITAL_COMMUNITY): Payer: Medicare Other

## 2020-01-05 ENCOUNTER — Inpatient Hospital Stay (HOSPITAL_COMMUNITY)
Admission: EM | Admit: 2020-01-05 | Discharge: 2020-02-09 | DRG: 871 | Disposition: E | Payer: Medicare Other | Attending: Family Medicine | Admitting: Family Medicine

## 2020-01-05 ENCOUNTER — Other Ambulatory Visit: Payer: Self-pay

## 2020-01-05 ENCOUNTER — Encounter (HOSPITAL_COMMUNITY): Payer: Self-pay | Admitting: Internal Medicine

## 2020-01-05 DIAGNOSIS — J9622 Acute and chronic respiratory failure with hypercapnia: Secondary | ICD-10-CM | POA: Diagnosis present

## 2020-01-05 DIAGNOSIS — Z87891 Personal history of nicotine dependence: Secondary | ICD-10-CM

## 2020-01-05 DIAGNOSIS — R0902 Hypoxemia: Secondary | ICD-10-CM

## 2020-01-05 DIAGNOSIS — N400 Enlarged prostate without lower urinary tract symptoms: Secondary | ICD-10-CM | POA: Diagnosis present

## 2020-01-05 DIAGNOSIS — R0602 Shortness of breath: Secondary | ICD-10-CM

## 2020-01-05 DIAGNOSIS — J986 Disorders of diaphragm: Secondary | ICD-10-CM | POA: Diagnosis present

## 2020-01-05 DIAGNOSIS — Z993 Dependence on wheelchair: Secondary | ICD-10-CM

## 2020-01-05 DIAGNOSIS — J80 Acute respiratory distress syndrome: Secondary | ICD-10-CM | POA: Diagnosis not present

## 2020-01-05 DIAGNOSIS — Z880 Allergy status to penicillin: Secondary | ICD-10-CM

## 2020-01-05 DIAGNOSIS — F419 Anxiety disorder, unspecified: Secondary | ICD-10-CM | POA: Diagnosis present

## 2020-01-05 DIAGNOSIS — R159 Full incontinence of feces: Secondary | ICD-10-CM | POA: Diagnosis present

## 2020-01-05 DIAGNOSIS — Z66 Do not resuscitate: Secondary | ICD-10-CM | POA: Diagnosis present

## 2020-01-05 DIAGNOSIS — C9110 Chronic lymphocytic leukemia of B-cell type not having achieved remission: Secondary | ICD-10-CM | POA: Diagnosis present

## 2020-01-05 DIAGNOSIS — R404 Transient alteration of awareness: Secondary | ICD-10-CM | POA: Diagnosis not present

## 2020-01-05 DIAGNOSIS — J189 Pneumonia, unspecified organism: Secondary | ICD-10-CM | POA: Diagnosis not present

## 2020-01-05 DIAGNOSIS — Z20822 Contact with and (suspected) exposure to covid-19: Secondary | ICD-10-CM | POA: Diagnosis not present

## 2020-01-05 DIAGNOSIS — E872 Acidosis: Secondary | ICD-10-CM | POA: Diagnosis present

## 2020-01-05 DIAGNOSIS — Z981 Arthrodesis status: Secondary | ICD-10-CM

## 2020-01-05 DIAGNOSIS — B91 Sequelae of poliomyelitis: Secondary | ICD-10-CM

## 2020-01-05 DIAGNOSIS — E870 Hyperosmolality and hypernatremia: Secondary | ICD-10-CM | POA: Diagnosis not present

## 2020-01-05 DIAGNOSIS — J9621 Acute and chronic respiratory failure with hypoxia: Secondary | ICD-10-CM | POA: Diagnosis not present

## 2020-01-05 DIAGNOSIS — J9811 Atelectasis: Secondary | ICD-10-CM | POA: Diagnosis not present

## 2020-01-05 DIAGNOSIS — R410 Disorientation, unspecified: Secondary | ICD-10-CM | POA: Diagnosis not present

## 2020-01-05 DIAGNOSIS — J90 Pleural effusion, not elsewhere classified: Secondary | ICD-10-CM | POA: Diagnosis not present

## 2020-01-05 DIAGNOSIS — Z800 Family history of malignant neoplasm of digestive organs: Secondary | ICD-10-CM

## 2020-01-05 DIAGNOSIS — Z79899 Other long term (current) drug therapy: Secondary | ICD-10-CM

## 2020-01-05 DIAGNOSIS — Z9989 Dependence on other enabling machines and devices: Secondary | ICD-10-CM

## 2020-01-05 DIAGNOSIS — R8271 Bacteriuria: Secondary | ICD-10-CM | POA: Diagnosis present

## 2020-01-05 DIAGNOSIS — M810 Age-related osteoporosis without current pathological fracture: Secondary | ICD-10-CM | POA: Diagnosis present

## 2020-01-05 DIAGNOSIS — Z7401 Bed confinement status: Secondary | ICD-10-CM

## 2020-01-05 DIAGNOSIS — A419 Sepsis, unspecified organism: Secondary | ICD-10-CM | POA: Diagnosis not present

## 2020-01-05 DIAGNOSIS — Z515 Encounter for palliative care: Secondary | ICD-10-CM

## 2020-01-05 LAB — CBC WITH DIFFERENTIAL/PLATELET
Abs Immature Granulocytes: 0.12 10*3/uL — ABNORMAL HIGH (ref 0.00–0.07)
Basophils Absolute: 0.1 10*3/uL (ref 0.0–0.1)
Basophils Relative: 0 %
Eosinophils Absolute: 0 10*3/uL (ref 0.0–0.5)
Eosinophils Relative: 0 %
HCT: 42.4 % (ref 39.0–52.0)
Hemoglobin: 13.9 g/dL (ref 13.0–17.0)
Immature Granulocytes: 1 %
Lymphocytes Relative: 42 %
Lymphs Abs: 8.2 10*3/uL — ABNORMAL HIGH (ref 0.7–4.0)
MCH: 32 pg (ref 26.0–34.0)
MCHC: 32.8 g/dL (ref 30.0–36.0)
MCV: 97.5 fL (ref 80.0–100.0)
Monocytes Absolute: 0.8 10*3/uL (ref 0.1–1.0)
Monocytes Relative: 4 %
Neutro Abs: 10.2 10*3/uL — ABNORMAL HIGH (ref 1.7–7.7)
Neutrophils Relative %: 53 %
Platelets: 314 10*3/uL (ref 150–400)
RBC: 4.35 MIL/uL (ref 4.22–5.81)
RDW: 13 % (ref 11.5–15.5)
WBC: 19.4 10*3/uL — ABNORMAL HIGH (ref 4.0–10.5)
nRBC: 0 % (ref 0.0–0.2)

## 2020-01-05 LAB — URINALYSIS, ROUTINE W REFLEX MICROSCOPIC
Bilirubin Urine: NEGATIVE
Glucose, UA: NEGATIVE mg/dL
Hgb urine dipstick: NEGATIVE
Ketones, ur: 5 mg/dL — AB
Nitrite: NEGATIVE
Protein, ur: 100 mg/dL — AB
Specific Gravity, Urine: 1.03 (ref 1.005–1.030)
WBC, UA: >50 WBC/hpf — ABNORMAL HIGH (ref 0–5)
pH: 5 (ref 5.0–8.0)

## 2020-01-05 LAB — COMPREHENSIVE METABOLIC PANEL
ALT: 20 U/L (ref 0–44)
AST: 28 U/L (ref 15–41)
Albumin: 3.3 g/dL — ABNORMAL LOW (ref 3.5–5.0)
Alkaline Phosphatase: 112 U/L (ref 38–126)
Anion gap: 10 (ref 5–15)
BUN: 21 mg/dL (ref 8–23)
CO2: 28 mmol/L (ref 22–32)
Calcium: 8.6 mg/dL — ABNORMAL LOW (ref 8.9–10.3)
Chloride: 100 mmol/L (ref 98–111)
Creatinine, Ser: 0.59 mg/dL — ABNORMAL LOW (ref 0.61–1.24)
GFR calc Af Amer: >60 mL/min (ref >60)
GFR calc non Af Amer: >60 mL/min (ref >60)
Glucose, Bld: 116 mg/dL — ABNORMAL HIGH (ref 70–99)
Potassium: 3.7 mmol/L (ref 3.5–5.1)
Sodium: 138 mmol/L (ref 135–145)
Total Bilirubin: 0.4 mg/dL (ref 0.3–1.2)
Total Protein: 6.7 g/dL (ref 6.5–8.1)

## 2020-01-05 LAB — SARS CORONAVIRUS 2 BY RT PCR (HOSPITAL ORDER, PERFORMED IN ~~LOC~~ HOSPITAL LAB): SARS Coronavirus 2: NEGATIVE

## 2020-01-05 LAB — LACTIC ACID, PLASMA: Lactic Acid, Venous: 1.6 mmol/L (ref 0.5–1.9)

## 2020-01-05 LAB — D-DIMER, QUANTITATIVE: D-Dimer, Quant: 1.71 ug/mL-FEU — ABNORMAL HIGH (ref 0.00–0.50)

## 2020-01-05 LAB — TROPONIN I (HIGH SENSITIVITY): Troponin I (High Sensitivity): 17 ng/L (ref <18)

## 2020-01-05 MED ORDER — SENNA 8.6 MG PO TABS
1.0000 | ORAL_TABLET | Freq: Two times a day (BID) | ORAL | Status: DC
  Administered 2020-01-05 – 2020-01-06 (×2): 8.6 mg via ORAL
  Filled 2020-01-05 (×4): qty 1

## 2020-01-05 MED ORDER — LORAZEPAM 0.5 MG PO TABS
0.5000 mg | ORAL_TABLET | Freq: Once | ORAL | Status: AC
  Administered 2020-01-05: 0.5 mg via ORAL
  Filled 2020-01-05: qty 1

## 2020-01-05 MED ORDER — SODIUM CHLORIDE 0.9 % IV SOLN: INTRAVENOUS | Status: DC

## 2020-01-05 MED ORDER — SODIUM CHLORIDE 0.9 % IV SOLN
1.0000 g | INTRAVENOUS | Status: DC
  Administered 2020-01-06 – 2020-01-08 (×3): 1 g via INTRAVENOUS
  Filled 2020-01-05 (×3): qty 1

## 2020-01-05 MED ORDER — HYDRALAZINE HCL 20 MG/ML IJ SOLN
10.0000 mg | Freq: Four times a day (QID) | INTRAMUSCULAR | Status: DC | PRN
  Administered 2020-01-08 (×3): 10 mg via INTRAVENOUS
  Filled 2020-01-05 (×3): qty 1

## 2020-01-05 MED ORDER — SODIUM CHLORIDE 0.9 % IV SOLN
1.0000 g | Freq: Once | INTRAVENOUS | Status: AC
  Administered 2020-01-05: 1 g via INTRAVENOUS
  Filled 2020-01-05: qty 10

## 2020-01-05 MED ORDER — SODIUM CHLORIDE 0.9 % IV BOLUS
500.0000 mL | Freq: Once | INTRAVENOUS | Status: AC
  Administered 2020-01-05: 500 mL via INTRAVENOUS

## 2020-01-05 MED ORDER — SODIUM CHLORIDE 0.9 % IV SOLN
500.0000 mg | INTRAVENOUS | Status: DC
  Administered 2020-01-06 – 2020-01-08 (×3): 500 mg via INTRAVENOUS
  Filled 2020-01-05 (×3): qty 500

## 2020-01-05 MED ORDER — ALBUTEROL SULFATE (2.5 MG/3ML) 0.083% IN NEBU
2.5000 mg | INHALATION_SOLUTION | Freq: Four times a day (QID) | RESPIRATORY_TRACT | Status: DC
  Administered 2020-01-05 – 2020-01-06 (×4): 2.5 mg via RESPIRATORY_TRACT
  Filled 2020-01-05 (×4): qty 3

## 2020-01-05 MED ORDER — IPRATROPIUM-ALBUTEROL 0.5-2.5 (3) MG/3ML IN SOLN: 3.0000 mL | Freq: Four times a day (QID) | RESPIRATORY_TRACT | Status: DC | PRN

## 2020-01-05 MED ORDER — ACETAMINOPHEN 325 MG PO TABS: 650.0000 mg | ORAL_TABLET | Freq: Four times a day (QID) | ORAL | Status: DC | PRN

## 2020-01-05 MED ORDER — SODIUM CHLORIDE 0.9 % IV SOLN
500.0000 mg | Freq: Once | INTRAVENOUS | Status: AC
  Administered 2020-01-05: 500 mg via INTRAVENOUS
  Filled 2020-01-05: qty 500

## 2020-01-05 MED ORDER — SODIUM CHLORIDE 0.9% FLUSH
3.0000 mL | Freq: Two times a day (BID) | INTRAVENOUS | Status: DC
  Administered 2020-01-06 – 2020-01-11 (×9): 3 mL via INTRAVENOUS

## 2020-01-05 MED ORDER — ONDANSETRON HCL 4 MG PO TABS: 4.0000 mg | ORAL_TABLET | Freq: Four times a day (QID) | ORAL | Status: DC | PRN

## 2020-01-05 MED ORDER — ONDANSETRON HCL 4 MG/2ML IJ SOLN: 4.0000 mg | Freq: Four times a day (QID) | INTRAMUSCULAR | Status: DC | PRN

## 2020-01-05 MED ORDER — MAGNESIUM HYDROXIDE 400 MG/5ML PO SUSP: 30.0000 mL | Freq: Every day | ORAL | Status: DC | PRN

## 2020-01-05 MED ORDER — BUPROPION HCL ER (SR) 150 MG PO TB12
150.0000 mg | ORAL_TABLET | Freq: Every day | ORAL | Status: DC
  Administered 2020-01-06 – 2020-01-07 (×2): 150 mg via ORAL
  Filled 2020-01-05 (×2): qty 1

## 2020-01-05 MED ORDER — ACETAMINOPHEN 650 MG RE SUPP: 650.0000 mg | Freq: Four times a day (QID) | RECTAL | Status: DC | PRN

## 2020-01-05 MED ORDER — LORAZEPAM 1 MG PO TABS: 1.0000 mg | ORAL_TABLET | Freq: Every evening | ORAL | Status: DC | PRN

## 2020-01-05 MED ORDER — ADULT MULTIVITAMIN W/MINERALS CH
1.0000 | ORAL_TABLET | Freq: Every day | ORAL | Status: DC
  Administered 2020-01-05 – 2020-01-07 (×3): 1 via ORAL
  Filled 2020-01-05 (×3): qty 1

## 2020-01-05 MED ORDER — SORBITOL 70 % SOLN: 30.0000 mL | Freq: Every day | Status: DC | PRN

## 2020-01-05 MED ORDER — ENOXAPARIN SODIUM 40 MG/0.4ML ~~LOC~~ SOLN
40.0000 mg | SUBCUTANEOUS | Status: DC
  Administered 2020-01-05 – 2020-01-09 (×5): 40 mg via SUBCUTANEOUS
  Filled 2020-01-05 (×5): qty 0.4

## 2020-01-05 MED ORDER — DM-GUAIFENESIN ER 30-600 MG PO TB12
1.0000 | ORAL_TABLET | Freq: Two times a day (BID) | ORAL | Status: DC
  Administered 2020-01-05 – 2020-01-06 (×2): 1 via ORAL
  Filled 2020-01-05 (×2): qty 1

## 2020-01-05 MED ORDER — SERTRALINE HCL 50 MG PO TABS
50.0000 mg | ORAL_TABLET | Freq: Every day | ORAL | Status: DC
  Administered 2020-01-06: 50 mg via ORAL
  Filled 2020-01-05 (×2): qty 1

## 2020-01-05 NOTE — Progress Notes (Signed)
Called to get report for patient coming to 1603. Nurse to call back to give report.

## 2020-01-05 NOTE — Progress Notes (Signed)
Report received from Carrie RN

## 2020-01-05 NOTE — ED Provider Notes (Signed)
Patient care transferred to me.  The patient has pneumonia.  When talk to the wife he has been coughing for several days with congestion and low-grade fever.  My suspicion that this is pulmonary embolus is quite low and I do not think CTA is needed right now as this sounds like pneumonia and will give IV antibiotics.  Covid testing negative.  Admit to hospitalist service.   Sherwood Gambler, MD 12/29/2019 [phone]

## 2020-01-05 NOTE — Progress Notes (Signed)
Pt has home Trilogy machine.  Pt placed on with 2 LPM bleed in, pt tolerating well.  RT to monitor and assess as needed.

## 2020-01-05 NOTE — ED Provider Notes (Signed)
[facility] DEPT Provider Note   CSN: [id number] Arrival date & time: 12/26/2019  1318     History Chief Complaint  Patient presents with  . Altered Mental Status  . Shortness of Breath    Manuel Reeves is a 86 y.o. male.  The history is provided by the patient and medical records. No language interpreter was used.  URI Presenting symptoms: cough, fatigue and fever   Presenting symptoms: no congestion   Severity:  Moderate Onset quality:  Gradual Duration:  4 days Timing:  Constant Progression:  Worsening Chronicity:  New Relieved by:  Nothing Worsened by:  Nothing Ineffective treatments:  None tried Associated symptoms: no headaches, no neck pain and no wheezing   Risk factors: being elderly        Past Medical History:  Diagnosis Date  . Anxiety   . CLL (chronic lymphocytic leukemia) ([place]) 02/20/2014  . Osteoporosis   . Pneumonia   . Polio   . Scoliosis     Patient Active Problem List   Diagnosis Date Noted  . Sepsis ([place]) 06/29/2018  . BPH with urinary obstruction 06/10/2018  . Acute and chronic respiratory failure with hypercapnia ([place]) 05/23/2018  . Right hip pain 05/23/2018  . History of ETT   . Acute respiratory failure with hypoxemia ([place])   . Goals of care, counseling/discussion   . Bronchitis with bronchospasm   . HCAP (healthcare-associated pneumonia) 05/03/2018  . Hyponatremia 05/03/2018  . BPH (benign prostatic hyperplasia) 04/22/2018  . Depression with anxiety 04/22/2018  . Cough 04/22/2018  . Aspiration pneumonia ([place]) 04/22/2018  . Leukocytosis 02/25/2018  . 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 Acute kidney failure ([place]) 02/24/2018  . Constipation 02/24/2018  . Hyperkalemia, diminished renal excretion 02/24/2018  . Delirium 02/24/2018  . Acute bilateral obstructive uropathy 02/24/2018  . CLL (chronic lymphocytic leukemia) ([place]) 02/20/2014    Past Surgical History:  Procedure Laterality Date  .  CATARACT EXTRACTION  09/2011   at [facility]  . COLONOSCOPY  03/2007  . RETINAL DETACHMENT SURGERY  09/1995  . SPINAL FUSION  10/1952  . TONSILLECTOMY    . TRANSURETHRAL RESECTION OF PROSTATE N/A 06/10/2018   Procedure: TRANSURETHRAL RESECTION OF THE PROSTATE (TURP);  Surgeon: Kathie Rhodes, MD;  Location: [facility];  Service: Urology;  Laterality: N/A;       Family History  Problem Relation Age of Onset  . Colon cancer Sister 51    Social History   Tobacco Use  . Smoking status: Former Research scientist (life sciences)  . Smokeless tobacco: Never Used  . Tobacco comment: Quit at age 70  Vaping Use  . Vaping Use: Never used  Substance Use Topics  . Alcohol use: Yes    Comment: occasional beer or wine manybe monthly  . Drug use: No    Home Medications Prior to Admission medications   Medication Sig Start Date End Date Taking? Authorizing Provider  albuterol (PROVENTIL) (2.5 MG/3ML) 0.083% nebulizer solution Take 3 mLs (2.5 mg total) by nebulization every 6 (six) hours as needed for wheezing or shortness of breath. Patient taking differently: Take 2.5 mg by nebulization 2 (two) times daily.  05/09/18   Patrecia Pour, MD  bisacodyl (DULCOLAX) 5 MG EC tablet Take 2 tablets (10 mg total) by mouth daily. Patient not taking: Reported on 12/19/2018 05/10/18   Patrecia Pour, MD  buPROPion [facility] SR) 150 MG 12 hr tablet Take 150 mg by mouth daily.  02/24/12   [provider]  Calcium  Carbonate-Vitamin D (CALTRATE 600+D PO) Take 1 tablet by mouth daily.    [provider]  ergocalciferol (VITAMIN D2) 50000 units capsule Take 50,000 Units by mouth every 14 (fourteen) days. On Monday    [provider]  LORazepam (ATIVAN) 0.5 MG tablet Take 1 mg by mouth at bedtime.     [provider]  Multiple Vitamins-Minerals (OCUVITE PO) Take 1 tablet by mouth daily.    [provider]  multivitamin-iron-minerals-folic acid (CENTRUM) chewable tablet Chew 1 tablet by mouth daily.     [provider]  phenazopyridine (PYRIDIUM) 200 MG tablet Take 1 tablet (200 mg total) by mouth 3 (three) times daily as needed for pain. Patient not taking: Reported on 12/19/2018 06/10/18   Kathie Rhodes, MD  sertraline (ZOLOFT) 50 MG tablet Take 100 mg by mouth daily.     [provider]  tamsulosin (FLOMAX) 0.4 MG CAPS capsule Take 1 capsule (0.4 mg total) by mouth daily. Patient not taking: Reported on 12/19/2018 02/28/18   Shelly Coss, MD    Allergies    Penicillins  Review of Systems   Review of Systems  Constitutional: Positive for chills, fatigue and fever. Negative for diaphoresis.  HENT: Negative for congestion.   Eyes: Negative for visual disturbance.  Respiratory: Positive for cough, chest tightness and shortness of breath. Negative for wheezing.   Cardiovascular: Negative for chest pain, palpitations and leg swelling.  Gastrointestinal: Positive for diarrhea. Negative for constipation, nausea and vomiting.  Genitourinary: Positive for dysuria. Negative for flank pain.  Musculoskeletal: Negative for back pain, neck pain and neck stiffness.  Skin: Negative for wound.  Neurological: Negative for weakness, light-headedness, numbness and headaches.  Psychiatric/Behavioral: Positive for confusion. Negative for agitation.  All other systems reviewed and are negative.   Physical Exam Updated Vital Signs BP 108/86   Pulse (!) 103   Temp 100 F (37.8 C) (Rectal)   Resp (!) 24   Ht 6' (1.829 m)   SpO2 99%   BMI 23.17 kg/m   Physical Exam Vitals and nursing note reviewed.  Constitutional:      General: Manuel Reeves is not in acute distress.    Appearance: Manuel Reeves is well-developed. Manuel Reeves is not ill-appearing, toxic-appearing or diaphoretic.  HENT:     Head: Normocephalic and atraumatic.  Eyes:     Conjunctiva/sclera: Conjunctivae normal.     Pupils: Pupils are equal, round, and reactive to light.  Cardiovascular:     Rate and Rhythm: Regular rhythm. Tachycardia  present.  No extrasystoles are present.    Heart sounds: No murmur heard.   Pulmonary:     Effort: Pulmonary effort is normal. Tachypnea present. No respiratory distress.     Breath sounds: Rhonchi present. No decreased breath sounds, wheezing or rales.  Chest:     Chest wall: No tenderness.  Abdominal:     Palpations: Abdomen is soft.     Tenderness: There is no abdominal tenderness.  Musculoskeletal:     Cervical back: Neck supple.     Right lower leg: No tenderness. No edema.     Left lower leg: No tenderness. No edema.  Skin:    General: Skin is warm and dry.     Capillary Refill: Capillary refill takes less than 2 seconds.  Neurological:     General: No focal deficit present.     Mental Status: Manuel Reeves is alert and oriented to person, place, and time.     Motor: No weakness.  Psychiatric:  Mood and Affect: Mood normal.     ED Results / Procedures / Treatments   Labs (all labs ordered are listed, but only abnormal results are displayed) Labs Reviewed  CBC WITH DIFFERENTIAL/PLATELET - Abnormal; Notable for the following components:      Result Value   WBC 19.4 (*)    All other components within normal limits  D-DIMER, QUANTITATIVE (NOT AT [facility]) - Abnormal; Notable for the following components:   D-Dimer, Quant 1.71 (*)    All other components within normal limits  CULTURE, BLOOD (ROUTINE X 2)  CULTURE, BLOOD (ROUTINE X 2)  SARS CORONAVIRUS 2 BY RT PCR (HOSPITAL ORDER, [facility])  URINE CULTURE  LACTIC ACID, PLASMA  COMPREHENSIVE METABOLIC PANEL  LACTIC ACID, PLASMA  URINALYSIS, ROUTINE W REFLEX MICROSCOPIC  TROPONIN I (HIGH SENSITIVITY)    EKG EKG Interpretation  Date/Time:  Friday January 05 2020 13:45:16 EDT Ventricular Rate:  101 PR Interval:    QRS Duration: 87 QT Interval:  320 QTC Calculation: 415 R Axis:   11 Text Interpretation: Sinus tachycardia Left atrial enlargement Probable anteroseptal infarct, old When compared  to prior, faster rate. No STEMI Confirmed by Antony Blackbird [phone]) on 12/23/2019 2:35:43 PM   Radiology DG Chest Portable 1 View  Result Date: 12/28/2019 CLINICAL DATA:  Cough, fever, shortness of breath, hypoxia EXAM: PORTABLE CHEST 1 VIEW COMPARISON:  06/30/2018 FINDINGS: Severe thoracic scoliosis limits the study. Heart is normal size. Bilateral lower lobe airspace opacities. Probable layering effusions. Aortic atherosclerosis. IMPRESSION: Layering bilateral effusions with bibasilar atelectasis or infiltrates, right greater than left. Cannot exclude pneumonia. Electronically Signed   By: Rolm Baptise M.D.   On: 12/21/2019 15:12    Procedures Procedures (including critical care time)  CRITICAL CARE Performed by: Gwenyth Allegra Zohair Epp Total critical care time: 35 minutes Critical care time was exclusive of separately billable procedures and treating other patients. Critical care was necessary to treat or prevent imminent or life-threatening deterioration. Critical care was time spent personally by me on the following activities: development of treatment plan with patient and/or surrogate as well as nursing, discussions with consultants, evaluation of patient's response to treatment, examination of patient, obtaining history from patient or surrogate, ordering and performing treatments and interventions, ordering and review of laboratory studies, ordering and review of radiographic studies, pulse oximetry and re-evaluation of patient's condition.  Manuel Reeves was evaluated in Emergency Department on 12/20/2019 for the symptoms described in the history of present illness. Manuel Reeves was evaluated in the context of the global COVID-19 pandemic, which necessitated consideration that the patient might be at risk for infection with the SARS-CoV-2 virus that causes COVID-19. Institutional protocols and algorithms that pertain to the evaluation of patients at risk for COVID-19 are in a state of rapid  change based on information released by regulatory bodies including the CDC and federal and state organizations. These policies and algorithms were followed during the patient's care in the ED.   Medications Ordered in ED Medications  cefTRIAXone (ROCEPHIN) 1 g in sodium chloride 0.9 % 100 mL IVPB (has no administration in time range)  azithromycin (ZITHROMAX) 500 mg in sodium chloride 0.9 % 250 mL IVPB (has no administration in time range)  sodium chloride 0.9 % bolus 500 mL (500 mLs Intravenous New Bag/Given 01/08/2020 1425)  LORazepam (ATIVAN) tablet 0.5 mg (0.5 mg Oral Given 12/13/2019 1438)    ED Course  I have reviewed the triage vital signs and the nursing notes.  Pertinent  labs & imaging results that were available during my care of the patient were reviewed by me and considered in my medical decision making (see chart for details).    MDM Rules/Calculators/A&P                          Manuel Reeves is a 86 y.o. 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  Patient is coming by wife reports that patient was diagnosed with pneumonia several days ago and was started antibiotics.  She reports that last night Manuel Reeves developed a fever and has had worsening cough and shortness of breath.  Manuel Reeves was acting more confused today and she reports his oxygen saturations were in the 80s at home with EMS.  Patient was started on oxygen supplementation and brought in.  Patient has a weak diaphragm from his post polio syndrome and uses a special breathing machine at night.  She reports that Manuel Reeves also complained of some dysuria last week and thinks Manuel Reeves may have a urinary tract infection as well.  Patient reports no chest pain but does report the shortness of  breath and nonproductive cough.  Manuel Reeves denies nausea or vomiting but does report some loose stools and diarrhea.  Manuel Reeves has been vaccinated for COVID-19 but they report somebody at his independent living facility was an asymptomatic positive Covid case last week.  On my evaluation, patient was slightly tachycardic and had a temperature of around 100.  Patient was hypoxic to 88% on room air when I was talking with him so we will start him on oxygen.  His chest and abdomen were nontender.  Patient has weakness in both his legs and Manuel Reeves has not been able to walk in many years due to the post polio syndrome.  Manuel Reeves does appear pale.  Clinically I am concerned about the patient having worsening pneumonia despite antibiotics and now having hypoxia.  Manuel Reeves does not take oxygen normally.  We will place him on oxygen and tested for Covid as well as get imaging and labs.  We will check a urinalysis given the recent dysuria.  Patient reports his confusion is intermittent and Manuel Reeves denies any headache or recent trauma.  Anticipate admission for the new oxygen requirement with a baseline weakened diaphragm with post polio syndrome and pneumonia.  Patient reports recently very anxious and would like some Ativan which Manuel Reeves takes at home.  We will order a small dose of this morning.  X-ray does show evidence of pneumonia.  Leukocytosis is present given his vital signs and description of symptoms, we will treat with antibiotics for pneumonia.  Chart shows that Manuel Reeves has tolerated Rocephin in the past, will give Rocephin and azithromycin initially.  Manuel Reeves still awaiting other labs as well as his Covid test but Manuel Reeves will need admission for pneumonia with hypoxia in the setting of weekend diaphragm from post polio syndrome.  Care transferred to oncoming team to admit him after work-up is completed.  Patient is on board with plan.   Final Clinical Impression(s) / ED Diagnoses Final diagnoses:  Community acquired pneumonia, unspecified laterality    Hypoxia  Shortness of breath     Clinical Impression: 1. Community acquired pneumonia, unspecified laterality   2. Hypoxia   3.  Shortness of breath     Disposition: Admit  This note was prepared with assistance of Dragon voice recognition software. Occasional wrong-word or sound-a-like substitutions may have occurred due to the inherent limitations of voice recognition software.      Aboubacar Matsuo, Gwenyth Allegra, MD 12/26/2019 [phone]

## 2020-01-05 NOTE — H&P (Signed)
[facility] History and Physical  Manuel Reeves Manuel Reeves DOB: [birth year] DOA: 01/09/2020 PCP: Crist Infante, MD  Admitted from: independent living facility Chief Complaint: Low-grade fever, cough  History of Present Illness: Manuel Reeves is a 86 y.o. male with past medical history of poliomyelitis, scoliosis, osteoporosis, CLL, anxiety who lives at an independent living facility with his wife.  Because of history of poliomyelitis, for last several years he is bedbound at baseline with minimal movement of his extremities, has health aide most of the hours of the day, uses Contractor wheelchair and a trilogy device at night.   He has episodes of disorientation patient's wife has noticed progressive decline in his physical strength in last several months which frustrates the patient at times adding to his disorientation.  He uses a urinal.  He had TURP done few years ago. Wife reports that he has bowel incontinence since TURP.  7 to 10 days ago, patient's wife noticed him to be more disoriented.  He had episodes of low-grade temperature.  His cough was getting wet. He was seen by his primary care provider on Tuesday 8/24 and started on antibiotics (patient and wife unable to recall the name).  He has been taking it as advised but wife did not notice any improvement and hence decided to bring him to the ED today 8/27. Did not notice shortness of breath prior to presentation. No nausea, vomiting, burning urination, diarrhea, focal neurological deficits.  In the ED, patient had a temperature of 100, heart rate 90s and 100s, respiratory rate 20-30, on 2 L oxygen to maintain saturation more than 90% Blood work with BMP unremarkable, troponin negative, lactic acid normal, WBC count elevated to 19.4. COVID-19 PCR negative. D-dimer slightly elevated 1.71. Urinalysis with hazy yellow urine, large amount of leukocytes, rare bacteria. Chest x-ray showed layering bilateral  effusions with bibasilar atelectasis or infiltrates, right greater than left.  Hospitalist service was consulted for further evaluation and management  Review of Systems:  All systems were reviewed and were negative unless otherwise mentioned in the HPI   Past medical history: Past Medical History:  Diagnosis Date  . Anxiety   . CLL (chronic lymphocytic leukemia) ([place]) 02/20/2014  . Osteoporosis   . Pneumonia   . Polio   . Scoliosis     Past surgical history: Past Surgical History:  Procedure Laterality Date  . CATARACT EXTRACTION  09/2011   at [facility]  . COLONOSCOPY  03/2007  . RETINAL DETACHMENT SURGERY  09/1995  . SPINAL FUSION  10/1952  . TONSILLECTOMY    . TRANSURETHRAL RESECTION OF PROSTATE N/A 06/10/2018   Procedure: TRANSURETHRAL RESECTION OF THE PROSTATE (TURP);  Surgeon: Kathie Rhodes, MD;  Location: [facility];  Service: Urology;  Laterality: N/A;    Social History:  reports that he has quit smoking. He has never used smokeless tobacco. He reports current alcohol use. He reports that he does not use drugs.  Allergies:  Allergies  Allergen Reactions  . Penicillins Rash    Has patient had a PCN reaction causing immediate rash, facial/tongue/throat swelling, SOB or lightheadedness with hypotension: Y Has patient had a PCN reaction causing severe rash involving mucus membranes or skin necrosis: N Has patient had a PCN reaction that required hospitalization: N Has patient had a PCN reaction occurring within the last 10 years: N If all of the above answers are "NO", then may proceed with Cephalosporin use.       Family history:  Family  History  Problem Relation Age of Onset  . Colon cancer Sister 45    Home Meds: Prior to Admission medications   Medication Sig Start Date End Date Taking? Authorizing Provider  albuterol (PROVENTIL) (2.5 MG/3ML) 0.083% nebulizer solution Take 3 mLs (2.5 mg total) by nebulization every 6 (six) hours as needed for wheezing or  shortness of breath. Patient taking differently: Take 2.5 mg by nebulization 2 (two) times daily.  05/09/18   Patrecia Pour, MD  bisacodyl (DULCOLAX) 5 MG EC tablet Take 2 tablets (10 mg total) by mouth daily. Patient not taking: Reported on 12/19/2018 05/10/18   Patrecia Pour, MD  buPROPion [facility] SR) 150 MG 12 hr tablet Take 150 mg by mouth daily.  02/24/12   [provider]  Calcium Carbonate-Vitamin D (CALTRATE 600+D PO) Take 1 tablet by mouth daily.    [provider]  ergocalciferol (VITAMIN D2) 50000 units capsule Take 50,000 Units by mouth every 14 (fourteen) days. On Monday    [provider]  LORazepam (ATIVAN) 0.5 MG tablet Take 1 mg by mouth at bedtime.     [provider]  Multiple Vitamins-Minerals (OCUVITE PO) Take 1 tablet by mouth daily.    [provider]  multivitamin-iron-minerals-folic acid (CENTRUM) chewable tablet Chew 1 tablet by mouth daily.    [provider]  phenazopyridine (PYRIDIUM) 200 MG tablet Take 1 tablet (200 mg total) by mouth 3 (three) times daily as needed for pain. Patient not taking: Reported on 12/19/2018 06/10/18   Kathie Rhodes, MD  sertraline (ZOLOFT) 50 MG tablet Take 100 mg by mouth daily.     [provider]  tamsulosin (FLOMAX) 0.4 MG CAPS capsule Take 1 capsule (0.4 mg total) by mouth daily. Patient not taking: Reported on 12/19/2018 02/28/18   Shelly Coss, MD    Physical Exam: Vitals:   12/22/2019 1345 12/20/2019 1500 12/30/2019 1518 01/09/2020 1729  BP: 134/72 108/86 108/86 126/63  Pulse: (!) 102 99 (!) 103 96  Resp: (!) 24 (!) 30 (!) 24 (!) 26  Temp:      TempSrc:      SpO2: 92% 99% 99% 97%  Height:       Wt Readings from Last 3 Encounters:  06/30/18 77.5 kg  06/21/18 81 kg  06/10/18 81.3 kg   Body mass index is 23.17 kg/m.  General exam: Appears calm and comfortable.  Not in physical distress Skin: No rashes, lesions or ulcers. HEENT: Atraumatic, normocephalic,  supple neck, no obvious bleeding Lungs: Wet cough audible on conversation.  Diminished air entry on both sides CVS: Regular rate and rhythm, no murmur GI/Abd soft, nontender, nondistended, bowels are present CNS: Alert, awake, oriented to place and person, limited movements of extremities at baseline because of polio Psychiatry: Normal mood Extremities: No pedal edema, no calf tenderness     Consult Orders  (From admission, onward)         Start     Ordered   01/01/2020 1650  Consult to hospitalist  ALL PATIENTS BEING ADMITTED/HAVING PROCEDURES NEED COVID-19 SCREENING  Once       Comments: ALL PATIENTS BEING ADMITTED/HAVING PROCEDURES NEED COVID-19 SCREENING  Provider:  (Not yet assigned)  Question Answer Comment  Place call to: [facility]   Reason for Consult Admit      12/14/2019 1649          Labs on Admission:   CBC: Recent Labs  Lab 12/12/2019 1428  WBC 19.4*  NEUTROABS PENDING  HGB 13.9  HCT 42.4  MCV 97.5  PLT 916    Basic Metabolic Panel: Recent Labs  Lab 12/23/2019 1428  NA 138  K 3.7  CL 100  CO2 28  GLUCOSE 116*  BUN 21  CREATININE 0.59*  CALCIUM 8.6*    Liver Function Tests: Recent Labs  Lab 12/18/2019 1428  AST 28  ALT 20  ALKPHOS 112  BILITOT 0.4  PROT 6.7  ALBUMIN 3.3*   No results for input(s): LIPASE, AMYLASE in the last 168 hours. No results for input(s): AMMONIA in the last 168 hours.  Cardiac Enzymes: No results for input(s): CKTOTAL, CKMB, CKMBINDEX, TROPONINI in the last 168 hours.  BNP (last 3 results) No results for input(s): BNP in the last 8760 hours.  ProBNP (last 3 results) No results for input(s): PROBNP in the last 8760 hours.  CBG: No results for input(s): GLUCAP in the last 168 hours.  Lipase  No results found for: LIPASE   Urinalysis    Component Value Date/Time   COLORURINE YELLOW 01/04/2020 1406   APPEARANCEUR HAZY (A) 12/19/2019 1406   LABSPEC 1.030 01/01/2020 1406   PHURINE 5.0 12/13/2019  1406   GLUCOSEU NEGATIVE 12/27/2019 1406   HGBUR NEGATIVE 12/23/2019 1406   [place] 12/26/2019 1406   KETONESUR 5 (A) 12/12/2019 1406   PROTEINUR 100 (A) 12/25/2019 1406   NITRITE NEGATIVE 01/01/2020 1406   LEUKOCYTESUR LARGE (A) 01/04/2020 1406     Drugs of Abuse  No results found for: LABOPIA, COCAINSCRNUR, LABBENZ, AMPHETMU, THCU, LABBARB    Radiological Exams on Admission: DG Chest Portable 1 View  Result Date: 01/02/2020 CLINICAL DATA:  Cough, fever, shortness of breath, hypoxia EXAM: PORTABLE CHEST 1 VIEW COMPARISON:  06/30/2018 FINDINGS: Severe thoracic scoliosis limits the study. Heart is normal size. Bilateral lower lobe airspace opacities. Probable layering effusions. Aortic atherosclerosis. IMPRESSION: Layering bilateral effusions with bibasilar atelectasis or infiltrates, right greater than left. Cannot exclude pneumonia. Electronically Signed   By: Rolm Baptise M.D.   On: 01/09/2020 15:12     ------------------------------------------------------------------------------------------------------ Assessment/Plan: Active Problems:   Pneumonia  Community-acquired pneumonia Acute respiratory failure with hypoxia -Presented with low-grade fever, wet cough, disorientation in a patient with already compromised lung function due to poliomyelitis. -WBC count elevated to 19.4. -Met SIRS criteria with tachycardia, leukocytosis but no evidence of organ dysfunction. -Lactic acid level normal.  Given a liter of normal saline in the ED.  Hemodynamically seems stable to me. -Blood cultures sent. -Started on IV Rocephin and azithromycin in the ED.  Continue the same. -Also started on Robitussin-DM, duo nebs, incentive spirometry, flutter valve. -Not on oxygen at home.  Currently on 2 to 3 L of supplemental oxygen.  Wean down as tolerated.  Chronic restrictive lung disease  -Secondary to poliomyelitis, scoliosis, diaphragm palsy, chronic pleural effusion. -Uses a trilogy  device at night and PRN during the day.  I have suggested the wife to bring the trilogy from home to put it on tonight.  Elevated D-dimer -Slightly elevated D-dimer.  Clinical picture consistent with infection.  Low suspicion of PE.  Abnormal urinalysis -Urinalysis with hazy yellow urine, large amount of leukocytes, rare bacteria. -No symptoms.  Anxiety -Wife reports episodes of restlessness, agitation.  At the time of my evaluation, patient is alert, awake, oriented to place and person.  Able to give some details of his history. -Wife states he is in his usual mental status at this time. -Home meds include Zoloft 100 mg daily, bupropion 150 mg  daily, Ativan 1 mg at bedtime. -Continue the same.  Mobility: Limited mobility at baseline. Code Status:   Code Status: Prior DNR/DNI.  Confirmed with patient and wife at bedside. DVT prophylaxis:  Lovenox subcu Antimicrobials:  IV Rocephin, IV azithromycin Fluid: None.  Encourage oral appetite and hydration  Diet:  Diet Order    None    Mechanical soft diet ordered  Consultants: None Family Communication:  Discussed with wife at bedside  Dispo: The patient is from: An independent living facility              Anticipated d/c is to: back to independent living facility              Anticipated d/c date is: Anticipate discharge back tomorrow if no fever overnight.  ------------------------------------------------------------------------------------- Severity of Illness: The appropriate patient status for this patient is OBSERVATION. Observation status is judged to be reasonable and necessary in order to provide the required intensity of service to ensure the patient's safety. The patient's presenting symptoms, physical exam findings, and initial radiographic and laboratory data in the context of their medical condition is felt to place them at decreased risk for further clinical deterioration. Furthermore, it is anticipated that the patient  will be medically stable for discharge from the hospital within 2 midnights of admission. The following factors support the patient status of observation.   " The patient's presenting symptoms include low-grade fever, cough. " The physical exam findings include hypoxia. " The initial radiographic and laboratory data are pneumonia  Signed, Terrilee Croak, MD [facility] Pager: [phone] (Secure Chat preferred). 01/01/2020

## 2020-01-05 NOTE — ED Triage Notes (Signed)
Pt BIBA from [facility] on [address]-  Per EMS=- Pt recently dx with UTI and pneumonia.   AOx2. Wife reports altered mental status starting Tuesday, worsening SHOB, productive cough. Pt also with fever starting today.   Denies CP. Denies n/v/d/dizziness. Non ambulatory at baseline.   Pt O2 at 91% on RA, 96% on 3L.

## 2020-01-06 DIAGNOSIS — R05 Cough: Secondary | ICD-10-CM | POA: Diagnosis not present

## 2020-01-06 DIAGNOSIS — Z880 Allergy status to penicillin: Secondary | ICD-10-CM | POA: Diagnosis not present

## 2020-01-06 DIAGNOSIS — Z87891 Personal history of nicotine dependence: Secondary | ICD-10-CM | POA: Diagnosis not present

## 2020-01-06 DIAGNOSIS — R159 Full incontinence of feces: Secondary | ICD-10-CM | POA: Diagnosis present

## 2020-01-06 DIAGNOSIS — R8271 Bacteriuria: Secondary | ICD-10-CM | POA: Diagnosis present

## 2020-01-06 DIAGNOSIS — C9110 Chronic lymphocytic leukemia of B-cell type not having achieved remission: Secondary | ICD-10-CM | POA: Diagnosis not present

## 2020-01-06 DIAGNOSIS — J9811 Atelectasis: Secondary | ICD-10-CM | POA: Diagnosis not present

## 2020-01-06 DIAGNOSIS — J986 Disorders of diaphragm: Secondary | ICD-10-CM | POA: Diagnosis present

## 2020-01-06 DIAGNOSIS — A419 Sepsis, unspecified organism: Secondary | ICD-10-CM | POA: Diagnosis present

## 2020-01-06 DIAGNOSIS — I712 Thoracic aortic aneurysm, without rupture: Secondary | ICD-10-CM | POA: Diagnosis not present

## 2020-01-06 DIAGNOSIS — J1100 Influenza due to unidentified influenza virus with unspecified type of pneumonia: Secondary | ICD-10-CM | POA: Diagnosis not present

## 2020-01-06 DIAGNOSIS — N400 Enlarged prostate without lower urinary tract symptoms: Secondary | ICD-10-CM | POA: Diagnosis present

## 2020-01-06 DIAGNOSIS — Z981 Arthrodesis status: Secondary | ICD-10-CM | POA: Diagnosis not present

## 2020-01-06 DIAGNOSIS — B91 Sequelae of poliomyelitis: Secondary | ICD-10-CM | POA: Diagnosis not present

## 2020-01-06 DIAGNOSIS — Z9989 Dependence on other enabling machines and devices: Secondary | ICD-10-CM | POA: Diagnosis not present

## 2020-01-06 DIAGNOSIS — Z66 Do not resuscitate: Secondary | ICD-10-CM | POA: Diagnosis present

## 2020-01-06 DIAGNOSIS — Z993 Dependence on wheelchair: Secondary | ICD-10-CM | POA: Diagnosis not present

## 2020-01-06 DIAGNOSIS — J189 Pneumonia, unspecified organism: Secondary | ICD-10-CM | POA: Diagnosis present

## 2020-01-06 DIAGNOSIS — M810 Age-related osteoporosis without current pathological fracture: Secondary | ICD-10-CM | POA: Diagnosis present

## 2020-01-06 DIAGNOSIS — R509 Fever, unspecified: Secondary | ICD-10-CM | POA: Diagnosis not present

## 2020-01-06 DIAGNOSIS — E872 Acidosis: Secondary | ICD-10-CM | POA: Diagnosis not present

## 2020-01-06 DIAGNOSIS — M4184 Other forms of scoliosis, thoracic region: Secondary | ICD-10-CM | POA: Diagnosis not present

## 2020-01-06 DIAGNOSIS — F419 Anxiety disorder, unspecified: Secondary | ICD-10-CM | POA: Diagnosis present

## 2020-01-06 DIAGNOSIS — J9622 Acute and chronic respiratory failure with hypercapnia: Secondary | ICD-10-CM | POA: Diagnosis present

## 2020-01-06 DIAGNOSIS — Z515 Encounter for palliative care: Secondary | ICD-10-CM | POA: Diagnosis not present

## 2020-01-06 DIAGNOSIS — Z79899 Other long term (current) drug therapy: Secondary | ICD-10-CM | POA: Diagnosis not present

## 2020-01-06 DIAGNOSIS — R0602 Shortness of breath: Secondary | ICD-10-CM | POA: Diagnosis not present

## 2020-01-06 DIAGNOSIS — J90 Pleural effusion, not elsewhere classified: Secondary | ICD-10-CM | POA: Diagnosis not present

## 2020-01-06 DIAGNOSIS — I700 Atherosclerosis of aorta: Secondary | ICD-10-CM | POA: Diagnosis not present

## 2020-01-06 DIAGNOSIS — Z7401 Bed confinement status: Secondary | ICD-10-CM | POA: Diagnosis not present

## 2020-01-06 DIAGNOSIS — E870 Hyperosmolality and hypernatremia: Secondary | ICD-10-CM | POA: Diagnosis not present

## 2020-01-06 DIAGNOSIS — Z7189 Other specified counseling: Secondary | ICD-10-CM | POA: Diagnosis not present

## 2020-01-06 DIAGNOSIS — Z20822 Contact with and (suspected) exposure to covid-19: Secondary | ICD-10-CM | POA: Diagnosis not present

## 2020-01-06 DIAGNOSIS — Z800 Family history of malignant neoplasm of digestive organs: Secondary | ICD-10-CM | POA: Diagnosis not present

## 2020-01-06 DIAGNOSIS — J9621 Acute and chronic respiratory failure with hypoxia: Secondary | ICD-10-CM | POA: Diagnosis not present

## 2020-01-06 LAB — CBC WITH DIFFERENTIAL/PLATELET
Abs Immature Granulocytes: 0.16 10*3/uL — ABNORMAL HIGH (ref 0.00–0.07)
Basophils Absolute: 0.1 10*3/uL (ref 0.0–0.1)
Basophils Relative: 0 %
Eosinophils Absolute: 0 10*3/uL (ref 0.0–0.5)
Eosinophils Relative: 0 %
HCT: 41.9 % (ref 39.0–52.0)
Hemoglobin: 13.5 g/dL (ref 13.0–17.0)
Immature Granulocytes: 1 %
Lymphocytes Relative: 47 %
Lymphs Abs: 10 10*3/uL — ABNORMAL HIGH (ref 0.7–4.0)
MCH: 31.5 pg (ref 26.0–34.0)
MCHC: 32.2 g/dL (ref 30.0–36.0)
MCV: 97.7 fL (ref 80.0–100.0)
Monocytes Absolute: 0.7 10*3/uL (ref 0.1–1.0)
Monocytes Relative: 4 %
Neutro Abs: 9.9 10*3/uL — ABNORMAL HIGH (ref 1.7–7.7)
Neutrophils Relative %: 48 %
Platelets: 333 10*3/uL (ref 150–400)
RBC: 4.29 MIL/uL (ref 4.22–5.81)
RDW: 13.3 % (ref 11.5–15.5)
WBC: 20.9 10*3/uL — ABNORMAL HIGH (ref 4.0–10.5)
nRBC: 0 % (ref 0.0–0.2)

## 2020-01-06 LAB — BASIC METABOLIC PANEL
Anion gap: 12 (ref 5–15)
BUN: 19 mg/dL (ref 8–23)
CO2: 25 mmol/L (ref 22–32)
Calcium: 8.8 mg/dL — ABNORMAL LOW (ref 8.9–10.3)
Chloride: 104 mmol/L (ref 98–111)
Creatinine, Ser: 0.55 mg/dL — ABNORMAL LOW (ref 0.61–1.24)
GFR calc Af Amer: >60 mL/min (ref >60)
GFR calc non Af Amer: >60 mL/min (ref >60)
Glucose, Bld: 104 mg/dL — ABNORMAL HIGH (ref 70–99)
Potassium: 3.5 mmol/L (ref 3.5–5.1)
Sodium: 141 mmol/L (ref 135–145)

## 2020-01-06 LAB — MAGNESIUM: Magnesium: 1.9 mg/dL (ref 1.7–2.4)

## 2020-01-06 MED ORDER — ALBUTEROL SULFATE (2.5 MG/3ML) 0.083% IN NEBU
2.5000 mg | INHALATION_SOLUTION | Freq: Three times a day (TID) | RESPIRATORY_TRACT | Status: DC
  Administered 2020-01-07 – 2020-01-10 (×10): 2.5 mg via RESPIRATORY_TRACT
  Filled 2020-01-06 (×10): qty 3

## 2020-01-06 MED ORDER — GUAIFENESIN-DM 100-10 MG/5ML PO SYRP
10.0000 mL | ORAL_SOLUTION | Freq: Four times a day (QID) | ORAL | Status: DC
  Administered 2020-01-06 (×2): 10 mL via ORAL
  Filled 2020-01-06 (×3): qty 10

## 2020-01-06 MED ORDER — LORAZEPAM 1 MG PO TABS
1.0000 mg | ORAL_TABLET | Freq: Three times a day (TID) | ORAL | Status: DC
  Administered 2020-01-06 – 2020-01-08 (×3): 1 mg via ORAL
  Filled 2020-01-06 (×3): qty 1

## 2020-01-06 NOTE — Progress Notes (Signed)
PROGRESS NOTE    Manuel Reeves  NLG:[id number] DOB: September 08, 1932 DOA: 01/07/2020 PCP: Crist Infante, MD   Brief Narrative:  Patient is 86 year old male with history of poliomyelitis, scoliosis, osteoporosis, CLL, anxiety who presented from independent living facility presented with low-grade fever, cough.  He has been bedbound at baseline since last several years.  He has home health aide to take care of him for most hours of the day, uses Contractor wheelchair and a trilogy device at night.  As per the wife, he had progressive decline in his physical strength in last several months. He has developed bowel incontinence since he had TURP. He was also found to be confused by his wife since last few days.  On presentation, he had mild fever, was in sinus tachycardia with respiratory rate of 20-30 and requiring 2 L of oxygen.  He had leukocytosis.  Urinalysis was not impressive of UTI.  Chest x-ray showed layering bilateral effusions with bilateral atelectasis or infiltrates right greater than left.  Patient was admitted for management of possible community-acquired pneumonia.  Assessment & Plan:   Active Problems:   Pneumonia  Acute respiratory failure with hypoxia: Required oxygen supplementation for maintenance of saturation.  Currently on 2 L of oxygen per minute.  He is not on oxygen at home.  Will try to taper the oxygen and monitor on room air.  Sepsis: Met SIRS criteria with tachycardia, leukocytosis but no evidence of organ dysfunction.  Has leukocytosis.  Continue current antibiotics.  Follow-up blood cultures.  IV fluids have been discontinued.  Community-acquired pneumonia: Presented with low-grade fever, wet cough, confusion.  Chest x-ray showed layering bilateral effusions with bilateral atelectasis or infiltrates right greater than left.  Continue azithromycin and Rocephin.  Continue mucolytics for cough.  Incentive spirometry, bronchodilators as needed.  He still has  cough but afebrile today. Aspiration pneumonia is also a possibility. We have requested for speech therapy evaluation.  He denies any problem with swallowing.  History of chronic respiratory lung disease: Secondary to poliomyelitis, sclerosis, diaphragmatic palsy.  Uses trilogy device at night and as needed during the day.  Continue Trelegy device  Elevated D-dimer: Mild.  Low suspicion for thromboembolic disease.  Anxiety: Wife reported episodes of restlessness, agitation.  On Zoloft, bupropion and Ativan at home which we will continue.  Poliomyelitis:Diagnosed with polio when he was 45. 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  Debility/deconditioning/generalised weakness: History of poliomyelitis with bedbound status.  Uses wheelchair for ambulation.  Continue supportive care.  Has good support at home.  Will request a PT/OT evaluation.         DVT prophylaxis:Lovenox Code Status: Full Family Communication: Called and discussed with wife on phone on 01/06/20 Status is: Observation  The patient remains OBS appropriate and will d/c before 2 midnights.  Dispo: The patient is from: Home              Anticipated d/c is to: Home              Anticipated d/c date is: 2 days              Patient currently is not medically stable to d/c.  Requiring oxygen for maintenance of saturation. Not stable for discharge yet.   Consultants: None  Procedures: None  Antimicrobials:  Anti-infectives (From admission, onward)   Start  Dose/Rate Route Frequency Ordered Stop   01/06/20 1800  cefTRIAXone (ROCEPHIN) 1 g in sodium chloride 0.9 % 100 mL IVPB        1 g 200 mL/hr over 30 Minutes Intravenous Every 24 hours 12/23/2019 1748     01/06/20 1800  azithromycin (ZITHROMAX) 500 mg in sodium chloride 0.9 % 250 mL IVPB        500 mg 250 mL/hr over 60 Minutes Intravenous Every 24  hours 12/10/2019 1748     12/16/2019 1530  cefTRIAXone (ROCEPHIN) 1 g in sodium chloride 0.9 % 100 mL IVPB        1 g 200 mL/hr over 30 Minutes Intravenous  Once 12/22/2019 1517 01/09/2020 1816   12/18/2019 1530  azithromycin (ZITHROMAX) 500 mg in sodium chloride 0.9 % 250 mL IVPB        500 mg 250 mL/hr over 60 Minutes Intravenous  Once 12/28/2019 1517 12/24/2019 1852      Subjective: Patient seen and examined the bedside this morning.  Hemodynamically stable during my evaluation.  Still having some wet cough.  On 2 L of oxygen per minute.  He says he feels better today.  Denies any nausea, vomiting or abdominal pain.  Objective: Vitals:   12/20/2019 2052 01/06/20 0008 01/06/20 0443 01/06/20 0721  BP:  (!) 154/80 (!) 104/58   Pulse:  87 94   Resp:  20 20   Temp:  98.6 F (37 C) 98.7 F (37.1 C)   TempSrc:  Oral Axillary   SpO2: 94% 93% (!) 88% 95%  Weight:      Height:        Intake/Output Summary (Last 24 hours) at 01/06/2020 0750 Last data filed at 01/06/2020 0600 Gross per 24 hour  Intake 1565 ml  Output 350 ml  Net 1215 ml   Filed Weights   12/31/2019 1840  Weight: 83.5 kg    Examination:  General exam: Not in obvious distress, debilitated, deconditioned Respiratory system: Bilateral diminished air sounds but no wheezes or crackles cardiovascular system: S1 & S2 heard, RRR. No JVD, murmurs, rubs, gallops or clicks. No pedal edema. Gastrointestinal system: Abdomen is nondistended, soft and nontender. No organomegaly or masses felt. Normal bowel sounds heard. Central nervous system: Alert and oriented.  Paraplegia Extremities: No edema, no clubbing ,no cyanosis, atrophy of bilateral lower extremities, contractures , deformities of the upper extremities skin: No rashes, lesions or ulcers,no icterus ,no pallor   Data Reviewed: I have personally reviewed following labs and imaging studies  CBC: Recent Labs  Lab 01/07/2020 1428  WBC 19.4*  NEUTROABS 10.2*  HGB 13.9  HCT 42.4  MCV  97.5  PLT 024   Basic Metabolic Panel: Recent Labs  Lab 12/16/2019 1428  NA 138  K 3.7  CL 100  CO2 28  GLUCOSE 116*  BUN 21  CREATININE 0.59*  CALCIUM 8.6*   GFR: Estimated Creatinine Clearance: 74.1 mL/min (A) (by C-G formula based on SCr of 0.59 mg/dL (L)). Liver Function Tests: Recent Labs  Lab 12/21/2019 1428  AST 28  ALT 20  ALKPHOS 112  BILITOT 0.4  PROT 6.7  ALBUMIN 3.3*   No results for input(s): LIPASE, AMYLASE in the last 168 hours. No results for input(s): AMMONIA in the last 168 hours. Coagulation Profile: No results for input(s): INR, PROTIME in the last 168 hours. Cardiac Enzymes: No results for input(s): CKTOTAL, CKMB, CKMBINDEX, TROPONINI in the last 168 hours. BNP (last 3 results) No results for input(s): PROBNP in  the last 8760 hours. HbA1C: No results for input(s): HGBA1C in the last 72 hours. CBG: No results for input(s): GLUCAP in the last 168 hours. Lipid Profile: No results for input(s): CHOL, HDL, LDLCALC, TRIG, CHOLHDL, LDLDIRECT in the last 72 hours. Thyroid Function Tests: No results for input(s): TSH, T4TOTAL, FREET4, T3FREE, THYROIDAB in the last 72 hours. Anemia Panel: No results for input(s): VITAMINB12, FOLATE, FERRITIN, TIBC, IRON, RETICCTPCT in the last 72 hours. Sepsis Labs: Recent Labs  Lab 01/01/2020 1428  LATICACIDVEN 1.6    Recent Results (from the past 240 hour(s))  SARS Coronavirus 2 by RT PCR (hospital order, performed in [facility] hospital lab) Nasopharyngeal Nasopharyngeal Swab     Status: None   Collection Time: 01/02/2020  2:28 PM   Specimen: Nasopharyngeal Swab  Result Value Ref Range Status   SARS Coronavirus 2 NEGATIVE NEGATIVE Final    Comment: (NOTE) SARS-CoV-2 target nucleic acids are NOT DETECTED.  The SARS-CoV-2 RNA is generally detectable in upper and lower respiratory specimens during the acute phase of infection. The lowest concentration of SARS-CoV-2 viral copies this assay can detect is 250 copies  / mL. A negative result does not preclude SARS-CoV-2 infection and should not be used as the sole basis for treatment or other patient management decisions.  A negative result may occur with improper specimen collection / handling, submission of specimen other than nasopharyngeal swab, presence of viral mutation(s) within the areas targeted by this assay, and inadequate number of viral copies (<250 copies / mL). A negative result must be combined with clinical observations, patient history, and epidemiological information.  Fact Sheet for Patients:   StrictlyIdeas.no  Fact Sheet for Healthcare Providers: BankingDealers.co.za  This test is not yet approved or  cleared by the [place] FDA and has been authorized for detection and/or diagnosis of SARS-CoV-2 by FDA under an Emergency Use Authorization (EUA).  This EUA will remain in effect (meaning this test can be used) for the duration of the COVID-19 declaration under Section 564(b)(1) of the Act, 21 U.S.C. section 360bbb-3(b)(1), unless the authorization is terminated or revoked sooner.  Performed at [facility], [place] [address], [place], [place] [address]          Radiology Studies: DG Chest Portable 1 View  Result Date: 12/16/2019 CLINICAL DATA:  Cough, fever, shortness of breath, hypoxia EXAM: PORTABLE CHEST 1 VIEW COMPARISON:  06/30/2018 FINDINGS: Severe thoracic scoliosis limits the study. Heart is normal size. Bilateral lower lobe airspace opacities. Probable layering effusions. Aortic atherosclerosis. IMPRESSION: Layering bilateral effusions with bibasilar atelectasis or infiltrates, right greater than left. Cannot exclude pneumonia. Electronically Signed   By: Rolm Baptise M.D.   On: 01/04/2020 15:12        Scheduled Meds: . albuterol  2.5 mg Nebulization Q6H  . buPROPion  150 mg Oral Daily  . dextromethorphan-guaiFENesin  1 tablet Oral BID  .  enoxaparin (LOVENOX) injection  40 mg Subcutaneous Q24H  . multivitamin with minerals  1 tablet Oral Daily  . senna  1 tablet Oral BID  . sertraline  50 mg Oral Daily  . sodium chloride flush  3 mL Intravenous Q12H   Continuous Infusions: . sodium chloride 75 mL/hr at 01/06/20 0600  . azithromycin    . cefTRIAXone (ROCEPHIN)  IV       LOS: 0 days    Time spent: 35 mins.More than 50% of that time was spent in counseling and/or coordination of care.      Knight Oelkers  Tawanna Solo, MD [facility] P8/28/2021, 7:50 AM

## 2020-01-07 ENCOUNTER — Inpatient Hospital Stay (HOSPITAL_COMMUNITY): Payer: Medicare Other

## 2020-01-07 LAB — CBC WITH DIFFERENTIAL/PLATELET
Abs Immature Granulocytes: 0.18 10*3/uL — ABNORMAL HIGH (ref 0.00–0.07)
Basophils Absolute: 0.1 10*3/uL (ref 0.0–0.1)
Basophils Relative: 0 %
Eosinophils Absolute: 0 10*3/uL (ref 0.0–0.5)
Eosinophils Relative: 0 %
HCT: 41.3 % (ref 39.0–52.0)
Hemoglobin: 13.1 g/dL (ref 13.0–17.0)
Immature Granulocytes: 1 %
Lymphocytes Relative: 48 %
Lymphs Abs: 10.9 10*3/uL — ABNORMAL HIGH (ref 0.7–4.0)
MCH: 31.4 pg (ref 26.0–34.0)
MCHC: 31.7 g/dL (ref 30.0–36.0)
MCV: 99 fL (ref 80.0–100.0)
Monocytes Absolute: 0.9 10*3/uL (ref 0.1–1.0)
Monocytes Relative: 4 %
Neutro Abs: 10.8 10*3/uL — ABNORMAL HIGH (ref 1.7–7.7)
Neutrophils Relative %: 47 %
Platelets: 329 10*3/uL (ref 150–400)
RBC: 4.17 MIL/uL — ABNORMAL LOW (ref 4.22–5.81)
RDW: 13.4 % (ref 11.5–15.5)
WBC: 22.8 10*3/uL — ABNORMAL HIGH (ref 4.0–10.5)
nRBC: 0 % (ref 0.0–0.2)

## 2020-01-07 LAB — POCT I-STAT 7, (LYTES, BLD GAS, ICA,H+H)
Acid-base deficit: 1 mmol/L (ref 0.0–2.0)
Bicarbonate: 29.6 mmol/L — ABNORMAL HIGH (ref 20.0–28.0)
Calcium, Ion: 1.3 mmol/L (ref 1.15–1.40)
HCT: 43 % (ref 39.0–52.0)
Hemoglobin: 14.6 g/dL (ref 13.0–17.0)
O2 Saturation: 82 %
Patient temperature: 98.4
Potassium: 3.4 mmol/L — ABNORMAL LOW (ref 3.5–5.1)
Sodium: 142 mmol/L (ref 135–145)
TCO2: 32 mmol/L (ref 22–32)
pCO2 arterial: 74.3 mmHg (ref 32.0–48.0)
pH, Arterial: 7.207 — ABNORMAL LOW (ref 7.350–7.450)
pO2, Arterial: 58 mmHg — ABNORMAL LOW (ref 83.0–108.0)

## 2020-01-07 LAB — URINE CULTURE

## 2020-01-07 MED ORDER — LORAZEPAM 2 MG/ML IJ SOLN
1.0000 mg | Freq: Three times a day (TID) | INTRAMUSCULAR | Status: DC | PRN
  Administered 2020-01-08 – 2020-01-10 (×5): 1 mg via INTRAVENOUS
  Filled 2020-01-07 (×5): qty 1

## 2020-01-07 MED ORDER — ACETYLCYSTEINE 20 % IN SOLN
2.0000 mL | Freq: Three times a day (TID) | RESPIRATORY_TRACT | Status: DC
  Administered 2020-01-08: 2 mL via RESPIRATORY_TRACT
  Filled 2020-01-07: qty 4

## 2020-01-07 MED ORDER — CHLORHEXIDINE GLUCONATE CLOTH 2 % EX PADS
6.0000 | MEDICATED_PAD | Freq: Every day | CUTANEOUS | Status: DC
  Administered 2020-01-07 – 2020-01-10 (×4): 6 via TOPICAL

## 2020-01-07 MED ORDER — CHLORHEXIDINE GLUCONATE 0.12 % MT SOLN
15.0000 mL | Freq: Two times a day (BID) | OROMUCOSAL | Status: DC
  Administered 2020-01-09 – 2020-01-10 (×4): 15 mL via OROMUCOSAL
  Filled 2020-01-07 (×3): qty 15

## 2020-01-07 MED ORDER — ACETYLCYSTEINE 10 % IN SOLN
2.0000 mL | Freq: Three times a day (TID) | RESPIRATORY_TRACT | Status: DC
  Filled 2020-01-07 (×2): qty 4

## 2020-01-07 MED ORDER — SODIUM CHLORIDE (PF) 0.9 % IJ SOLN
INTRAMUSCULAR | Status: AC
  Filled 2020-01-07: qty 50

## 2020-01-07 MED ORDER — IOHEXOL 350 MG/ML SOLN
100.0000 mL | Freq: Once | INTRAVENOUS | Status: AC | PRN
  Administered 2020-01-07: 100 mL via INTRAVENOUS

## 2020-01-07 MED ORDER — POTASSIUM CHLORIDE CRYS ER 20 MEQ PO TBCR: 40.0000 meq | EXTENDED_RELEASE_TABLET | Freq: Once | ORAL | Status: DC

## 2020-01-07 MED ORDER — ORAL CARE MOUTH RINSE
15.0000 mL | Freq: Two times a day (BID) | OROMUCOSAL | Status: DC
  Administered 2020-01-08 – 2020-01-11 (×7): 15 mL via OROMUCOSAL

## 2020-01-07 NOTE — Progress Notes (Signed)
SLP Cancellation Note  Patient Details Name: Manuel Reeves MRN: [id number] DOB: 11-07-1932   Cancelled treatment:       Reason Eval/Treat Not Completed: Medical issues which prohibited therapy  Patient currently on continuous BiPAP and not appropriate to come off at this time.  ST will continue efforts to complete swallowing evaluation.   Shelly Flatten, MA, CCC-SLP Acute Rehab SLP [phone]  Lamar Sprinkles 01/07/2020, 1:23 PM

## 2020-01-07 NOTE — Progress Notes (Addendum)
PT Cancellation Note  Patient Details Name: Manuel Reeves MRN: [id number] DOB: [birth year]   Cancelled Treatment:    Reason Eval/Treat Not Completed: PT screened, no needs identified, will sign off. Per chart review, pt is total care-requiring hoyer lift and power chair for mobility. Pt is not appropriate for acute PT services. Will sign off.    [facility]  Office: [phone] Pager: [phone]

## 2020-01-07 NOTE — Progress Notes (Signed)
   01/07/20 1040  Assess: MEWS Score  Temp 98.4 F (36.9 C)  BP (!) 187/97  Pulse Rate (!) 124  Resp (!) 26  SpO2 (!) 64 %  O2 Device Nasal Cannula  O2 Flow Rate (L/min) 2 L/min  Assess: MEWS Score  MEWS Temp 0  MEWS Systolic 0  MEWS Pulse 2  MEWS RR 2  MEWS LOC 0  MEWS Score 4  MEWS Score Color Red  Assess: if the MEWS score is Yellow or Red  Were vital signs taken at a resting state? Yes  Focused Assessment Change from prior assessment (see assessment flowsheet)  Early Detection of Sepsis Score *See Row Information* High  MEWS guidelines implemented *See Row Information* Yes  Treat  MEWS Interventions Escalated (See documentation below)  Escalate  MEWS: Escalate Red: discuss with charge nurse/RN and provider, consider discussing with RRT  Notify: Charge Nurse/RN  Name of Charge Nurse/RN Notified Vergia Alcon RN  Date Charge Nurse/RN Notified 01/07/20  Time Charge Nurse/RN Notified 1045  Notify: Provider  Provider Name/Title Adhikari  Date Provider Notified 01/07/20  Time Provider Notified 1045  Notification Type Page  Notification Reason Change in status  Response Other (Comment)  Date of Provider Response 01/07/20  Time of Provider Response 1055  Notify: Rapid Response  Name of Rapid Response RN Notified Clarise Cruz RN  Date Rapid Response Notified 01/07/20  Time Rapid Response Notified 1696  Document  Patient Outcome Transferred/level of care increased  Progress note created (see row info) Yes  Upon administering patient daily medications at 1030 patient all of a sudden stated that he "did not feel good, I'm getting hot". I observed that patients color became pale and he was diaphoretic. Vitals signs were obtained, at that time Respiratory Technician entered the room. He also checked patients oxygen saturation. At that time the Rapid Response RN Clarise Cruz was called and Dr Tawanna Solo was notified.

## 2020-01-07 NOTE — Progress Notes (Addendum)
PROGRESS NOTE    Manuel Reeves  ALP:[id number] DOB: 10-15-1932 DOA: 01/04/2020 PCP: Crist Infante, MD   Brief Narrative:  Patient is 86 year old male with history of poliomyelitis, scoliosis, osteoporosis, CLL, anxiety who presented from independent living facility presented with low-grade fever, cough.  He has been bedbound at baseline since last several years.  He has home health aide to take care of him for most hours of the day, uses Contractor wheelchair and a trilogy device at night.  As per the wife, he had progressive decline in his physical strength in last several months. He has developed bowel incontinence since he had TURP. He was also found to be confused by his wife since last few days.  On presentation, he had mild fever, was in sinus tachycardia with respiratory rate of 20-30 and requiring 2 L of oxygen.  He had leukocytosis.  Urinalysis was not impressive of UTI.  Chest x-ray showed layering bilateral effusions with bilateral atelectasis or infiltrates right greater than left.  Patient was admitted for management of possible community-acquired pneumonia. Patient went into acute respiratory distress on the morning of 01/07/20 and had to be transferred to stepdown, put on BiPAP.  Assessment & Plan:   Active Problems:   Pneumonia  Acute respiratory failure with hypoxia: Required oxygen supplementation for maintenance of saturation.  He was  on 2 L of oxygen per minute.  He is not on oxygen at home. Patient went into acute respiratory distress this morning and had to be transferred to stepdown, currently on BiPAP.  ABG showed hypercarbia.  CT angiogram did not show any PE but showed bubtotal atelectasis of BILATERAL lower lobes with patchy infiltrates LEFT upper lobe.  Sepsis: Met SIRS criteria with tachycardia, leukocytosis but no evidence of organ dysfunction.  Has leukocytosis.  Continue current antibiotics.  Follow-up blood cultures.  IV fluids have been  discontinued.  Community-acquired pneumonia: Presented with low-grade fever, wet cough, confusion.  Chest x-ray on admission showed layering bilateral effusions with bilateral atelectasis or infiltrates right greater than left.  Continue azithromycin and Rocephin.  Continue mucolytics for cough.  Incentive spirometry, bronchodilators as needed.  He still has cough but afebrile today. Aspiration pneumonia is also a possibility. We have requested for speech therapy evaluation.  He denies any problem with swallowing. CT and chest x-ray findings done today showed possibility of bilateral lower lungs pneumonia  History of chronic respiratory lung disease: Secondary to poliomyelitis, sclerosis, diaphragmatic palsy.  Uses trilogy device at night and as needed during the day.  Continue Trelegy device  Elevated D-dimer: Mild.  Low suspicion for thromboembolic disease.  Anxiety: Wife reported episodes of restlessness, agitation.  On Zoloft, bupropion and Ativan at home which we will continue.  Poliomyelitis:Diagnosed with polio when he was 17. 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  Debility/deconditioning/generalised weakness: History of poliomyelitis with bedbound status.  Uses wheelchair for ambulation.  Continue supportive care.  Has good support at home.We have requested  for PT/OT evaluation.         DVT prophylaxis:Lovenox Code Status: Full Family Communication: Called and discussed with son on phone on 01/07/20 Status is: inpatient    Dispo: The patient is from: Home              Anticipated d/c is to: Home  Anticipated d/c date is: 2-3 days              Patient currently is not medically stable to d/c.  On BiPAP   Consultants: None  Procedures: None  Antimicrobials:  Anti-infectives (From admission, onward)   Start     Dose/Rate Route Frequency  Ordered Stop   01/06/20 1800  cefTRIAXone (ROCEPHIN) 1 g in sodium chloride 0.9 % 100 mL IVPB        1 g 200 mL/hr over 30 Minutes Intravenous Every 24 hours 01/04/2020 1748     01/06/20 1800  azithromycin (ZITHROMAX) 500 mg in sodium chloride 0.9 % 250 mL IVPB        500 mg 250 mL/hr over 60 Minutes Intravenous Every 24 hours 12/19/2019 1748     12/15/2019 1530  cefTRIAXone (ROCEPHIN) 1 g in sodium chloride 0.9 % 100 mL IVPB        1 g 200 mL/hr over 30 Minutes Intravenous  Once 12/30/2019 1517 01/09/2020 1816   12/15/2019 1530  azithromycin (ZITHROMAX) 500 mg in sodium chloride 0.9 % 250 mL IVPB        500 mg 250 mL/hr over 60 Minutes Intravenous  Once 12/23/2019 1517 12/16/2019 1852      Subjective: Patient seen and examined the bedside this morning.  During my evaluation, he was not in any respiratory distress but was still having some cough.  Shortly after my evaluation, patient went into respiratory distress became tachypneic and desaturated.  ABG showed hypercarbia.  Had to be put on BiPAP.  Patient again evaluated this afternoon at the stepdown unit.  He was on BiPAP, comfortable and was not in any kind of respiratory distress.  Objective: Vitals:   01/06/20 1443 01/06/20 2031 01/06/20 2116 01/07/20 0552  BP: (!) 152/71 (!) 151/74  (!) 151/80  Pulse: (!) 106 (!) 109  92  Resp: $Remo'18 20  20  'jLfzs$ Temp: 97.6 F (36.4 C) 98.6 F (37 C)  98.2 F (36.8 C)  TempSrc: Oral Oral  Oral  SpO2: 93% 92% 90% 91%  Weight:      Height:        Intake/Output Summary (Last 24 hours) at 01/07/2020 0846 Last data filed at 01/06/2020 1500 Gross per 24 hour  Intake 240 ml  Output 200 ml  Net 40 ml   Filed Weights   12/28/2019 1840  Weight: 83.5 kg    Examination:  General exam: Deconditioned, debilitated HEENT:PERRL,Oral mucosa moist, Ear/Nose normal on gross exam Respiratory system: BiPAP, no wheezes or crackles  cardiovascular system: S1 & S2 heard, RRR. No JVD, murmurs, rubs, gallops or  clicks. Gastrointestinal system: Abdomen is nondistended, soft and nontender. No organomegaly or masses felt. Normal bowel sounds heard. Central nervous system: Alert and oriented. No focal neurological deficits. Extremities: No edema, no clubbing ,no cyanosis Skin: No rashes, lesions or ulcers,no icterus ,no pallor    Data Reviewed: I have personally reviewed following labs and imaging studies  CBC: Recent Labs  Lab 12/15/2019 1428 01/06/20 0719 01/07/20 0631  WBC 19.4* 20.9* 22.8*  NEUTROABS 10.2* 9.9* 10.8*  HGB 13.9 13.5 13.1  HCT 42.4 41.9 41.3  MCV 97.5 97.7 99.0  PLT 314 333 062   Basic Metabolic Panel: Recent Labs  Lab 01/09/2020 1428 01/06/20 0719  NA 138 141  K 3.7 3.5  CL 100 104  CO2 28 25  GLUCOSE 116* 104*  BUN 21 19  CREATININE 0.59* 0.55*  CALCIUM 8.6* 8.8*  MG  --  1.9   GFR: Estimated Creatinine Clearance: 74.1 mL/min (A) (by C-G formula based on SCr of 0.55 mg/dL (L)). Liver Function Tests: Recent Labs  Lab 12/12/2019 1428  AST 28  ALT 20  ALKPHOS 112  BILITOT 0.4  PROT 6.7  ALBUMIN 3.3*   No results for input(s): LIPASE, AMYLASE in the last 168 hours. No results for input(s): AMMONIA in the last 168 hours. Coagulation Profile: No results for input(s): INR, PROTIME in the last 168 hours. Cardiac Enzymes: No results for input(s): CKTOTAL, CKMB, CKMBINDEX, TROPONINI in the last 168 hours. BNP (last 3 results) No results for input(s): PROBNP in the last 8760 hours. HbA1C: No results for input(s): HGBA1C in the last 72 hours. CBG: No results for input(s): GLUCAP in the last 168 hours. Lipid Profile: No results for input(s): CHOL, HDL, LDLCALC, TRIG, CHOLHDL, LDLDIRECT in the last 72 hours. Thyroid Function Tests: No results for input(s): TSH, T4TOTAL, FREET4, T3FREE, THYROIDAB in the last 72 hours. Anemia Panel: No results for input(s): VITAMINB12, FOLATE, FERRITIN, TIBC, IRON, RETICCTPCT in the last 72 hours. Sepsis Labs: Recent Labs   Lab 12/22/2019 1428  LATICACIDVEN 1.6    Recent Results (from the past 240 hour(s))  Blood culture (routine x 2)     Status: None (Preliminary result)   Collection Time: 12/20/2019  2:28 PM   Specimen: BLOOD  Result Value Ref Range Status   Specimen Description   Final    BLOOD BLOOD LEFT FOREARM Performed at [facility] [address] [place], [place] [address]    Special Requests   Final    BOTTLES DRAWN AEROBIC AND ANAEROBIC Blood Culture adequate volume Performed at [facility] [address] [place], [place] [address]    Culture   Final    NO GROWTH < 12 HOURS Performed at [facility] [address] [place], [place] [address]    Report Status PENDING  Incomplete  SARS Coronavirus 2 by RT PCR (hospital order, performed in [facility] hospital lab) Nasopharyngeal Nasopharyngeal Swab     Status: None   Collection Time: 01/03/2020  2:28 PM   Specimen: Nasopharyngeal Swab  Result Value Ref Range Status   SARS Coronavirus 2 NEGATIVE NEGATIVE Final    Comment: (NOTE) SARS-CoV-2 target nucleic acids are NOT DETECTED.  The SARS-CoV-2 RNA is generally detectable in upper and lower respiratory specimens during the acute phase of infection. The lowest concentration of SARS-CoV-2 viral copies this assay can detect is 250 copies / mL. A negative result does not preclude SARS-CoV-2 infection and should not be used as the sole basis for treatment or other patient management decisions.  A negative result may occur with improper specimen collection / handling, submission of specimen other than nasopharyngeal swab, presence of viral mutation(s) within the areas targeted by this assay, and inadequate number of viral copies (<250 copies / mL). A negative result must be combined with clinical observations, patient history, and epidemiological information.  Fact Sheet for Patients:   StrictlyIdeas.no  Fact Sheet for  Healthcare Providers: BankingDealers.co.za  This test is not yet approved or  cleared by the [place] FDA and has been authorized for detection and/or diagnosis of SARS-CoV-2 by FDA under an Emergency Use Authorization (EUA).  This EUA will remain in effect (meaning this test can be used) for the duration of the COVID-19 declaration under Section 564(b)(1) of the Act, 21 U.S.C. section 360bbb-3(b)(1), unless the authorization is terminated or revoked sooner.  Performed  at [facility], [place] [address] [place], [place] [address]   Blood culture (routine x 2)     Status: None (Preliminary result)   Collection Time: 12/25/2019  2:29 PM   Specimen: BLOOD  Result Value Ref Range Status   Specimen Description   Final    BLOOD LEFT HAND Performed at [facility] [address], [place], [place] [address]    Special Requests   Final    BOTTLES DRAWN AEROBIC AND ANAEROBIC Blood Culture adequate volume Performed at [facility] [address] [place], [place] [address]    Culture   Final    NO GROWTH < 12 HOURS Performed at [facility] [address] [place], [place] [address]    Report Status PENDING  Incomplete         Radiology Studies: DG Chest Portable 1 View  Result Date: 12/18/2019 CLINICAL DATA:  Cough, fever, shortness of breath, hypoxia EXAM: PORTABLE CHEST 1 VIEW COMPARISON:  06/30/2018 FINDINGS: Severe thoracic scoliosis limits the study. Heart is normal size. Bilateral lower lobe airspace opacities. Probable layering effusions. Aortic atherosclerosis. IMPRESSION: Layering bilateral effusions with bibasilar atelectasis or infiltrates, right greater than left. Cannot exclude pneumonia. Electronically Signed   By: Rolm Baptise M.D.   On: 01/04/2020 15:12        Scheduled Meds: . albuterol  2.5 mg Nebulization TID  . buPROPion  150 mg Oral Daily  . enoxaparin (LOVENOX)  injection  40 mg Subcutaneous Q24H  . guaiFENesin-dextromethorphan  10 mL Oral Q6H  . LORazepam  1 mg Oral TID  . multivitamin with minerals  1 tablet Oral Daily  . senna  1 tablet Oral BID  . sertraline  50 mg Oral Daily  . sodium chloride flush  3 mL Intravenous Q12H   Continuous Infusions: . azithromycin 500 mg (01/06/20 1841)  . cefTRIAXone (ROCEPHIN)  IV 1 g (01/06/20 1730)     LOS: 1 day    Time spent: 35 mins.More than 50% of that time was spent in counseling and/or coordination of care.      Shelly Coss, MD [facility] P8/29/2021, 8:46 AM

## 2020-01-08 DIAGNOSIS — J9621 Acute and chronic respiratory failure with hypoxia: Secondary | ICD-10-CM

## 2020-01-08 DIAGNOSIS — J189 Pneumonia, unspecified organism: Secondary | ICD-10-CM

## 2020-01-08 LAB — CBC WITH DIFFERENTIAL/PLATELET
Abs Immature Granulocytes: 0.29 10*3/uL — ABNORMAL HIGH (ref 0.00–0.07)
Basophils Absolute: 0.1 10*3/uL (ref 0.0–0.1)
Basophils Relative: 1 %
Eosinophils Absolute: 0 10*3/uL (ref 0.0–0.5)
Eosinophils Relative: 0 %
HCT: 40.9 % (ref 39.0–52.0)
Hemoglobin: 12.7 g/dL — ABNORMAL LOW (ref 13.0–17.0)
Immature Granulocytes: 1 %
Lymphocytes Relative: 53 %
Lymphs Abs: 12.3 10*3/uL — ABNORMAL HIGH (ref 0.7–4.0)
MCH: 31.4 pg (ref 26.0–34.0)
MCHC: 31.1 g/dL (ref 30.0–36.0)
MCV: 101.2 fL — ABNORMAL HIGH (ref 80.0–100.0)
Monocytes Absolute: 0.9 10*3/uL (ref 0.1–1.0)
Monocytes Relative: 4 %
Neutro Abs: 9.5 10*3/uL — ABNORMAL HIGH (ref 1.7–7.7)
Neutrophils Relative %: 41 %
Platelets: 317 10*3/uL (ref 150–400)
RBC: 4.04 MIL/uL — ABNORMAL LOW (ref 4.22–5.81)
RDW: 13.7 % (ref 11.5–15.5)
WBC: 23 10*3/uL — ABNORMAL HIGH (ref 4.0–10.5)
nRBC: 0 % (ref 0.0–0.2)

## 2020-01-08 LAB — PROCALCITONIN: Procalcitonin: 0.16 ng/mL

## 2020-01-08 LAB — BASIC METABOLIC PANEL
Anion gap: 10 (ref 5–15)
BUN: 17 mg/dL (ref 8–23)
CO2: 25 mmol/L (ref 22–32)
Calcium: 8.9 mg/dL (ref 8.9–10.3)
Chloride: 107 mmol/L (ref 98–111)
Creatinine, Ser: 0.43 mg/dL — ABNORMAL LOW (ref 0.61–1.24)
GFR calc Af Amer: >60 mL/min (ref >60)
GFR calc non Af Amer: >60 mL/min (ref >60)
Glucose, Bld: 113 mg/dL — ABNORMAL HIGH (ref 70–99)
Potassium: 3.8 mmol/L (ref 3.5–5.1)
Sodium: 142 mmol/L (ref 135–145)

## 2020-01-08 LAB — BLOOD GAS, ARTERIAL
Acid-base deficit: 1.1 mmol/L (ref 0.0–2.0)
Bicarbonate: 26.1 mmol/L (ref 20.0–28.0)
Delivery systems: POSITIVE
O2 Content: 40 L/min
O2 Saturation: 97.4 %
Patient temperature: 98.6
pCO2 arterial: 56.7 mmHg — ABNORMAL HIGH (ref 32.0–48.0)
pH, Arterial: 7.285 — ABNORMAL LOW (ref 7.350–7.450)
pO2, Arterial: 93 mmHg (ref 83.0–108.0)

## 2020-01-08 LAB — STREP PNEUMONIAE URINARY ANTIGEN: Strep Pneumo Urinary Antigen: NEGATIVE

## 2020-01-08 NOTE — Progress Notes (Signed)
Patient removed from BiPAP and placed on 3 L. Patient had increased WOB, feels like his breathing is "okay". Encouraged to let myself or nursing staff know of any discomfort with breathing. ABG sent to lab.

## 2020-01-08 NOTE — Consult Note (Signed)
NAME:  Manuel Reeves, MRN:  [id number], DOB:  10/14/1932, LOS: 2 ADMISSION DATE:  01/07/2020, CONSULTATION DATE: 01/08/2020 REFERRING MD: Dr. Tawanna Solo, CHIEF COMPLAINT: Hypoxemic respiratory failure  Brief History   Asked to see patient for hypoxemic respiratory failure  History of present illness   86 year old gentleman with a history of polio, scoliosis, osteoporosis Resides in an independent living facility with his spouse Bedbound, gets around with a wheelchair Progressively struggling with deconditioning Increasing disorientation in the last week, low-grade fever, wet cough Antibiotics started in the outpatient setting 8/24-was not noticing any significant improvement in symptoms and decided to come to the hospital for evaluation  Initial evaluation he was tachypneic, temperature of 100, mild tachycardia Bibasal infiltrate on chest x-ray -Admitted for respiratory failure   Past Medical History   Past Medical History:  Diagnosis Date  . Anxiety   . CLL (chronic lymphocytic leukemia) ([facility]) 02/20/2014  . Osteoporosis   . Pneumonia   . Polio   . Scoliosis      Significant Hospital Events   On BiPAP  Consults:  pccm  Procedures:  None  Significant Diagnostic Tests:  ABG-7.28 5/57/93 CT scan of the chest performed 8/29 Reviewed by myself showing bibasilar atelectasis  Micro Data:  Blood culture 8/27-no organism so far Urine culture 8/27-no organism  Antimicrobials:  Azithromycin 8/27>> Ceftriaxone 8/27>>  Interim history/subjective:  Feels a little bit better, currently on BiPAP  Objective   Blood pressure (!) 148/50, pulse (!) 102, temperature (!) 97.4 F (36.3 C), temperature source Axillary, resp. rate (!) 22, height 6' (1.829 m), weight 83.5 kg, SpO2 100 %.    FiO2 (%):  [50 %] 50 %   Intake/Output Summary (Last 24 hours) at 01/08/2020 1529 Last data filed at 01/08/2020 0645 Gross per 24 hour  Intake 700 ml  Output 450 ml  Net 250 ml    Filed Weights   01/06/2020 1840  Weight: 83.5 kg    Examination: General: Chronically ill-appearing on BiPAP HENT: On BiPAP, mask in place Lungs: Decreased air movement bilaterally Cardiovascular: S1-S2 appreciated Abdomen: Bowel sounds appreciated Extremities: No clubbing, no edema Neuro: Alert and oriented GU:   Resolved Hospital Problem list     Assessment & Plan:  Acute hypoxemic/hypercapnic respiratory failure Acute on chronic hypoxemic respiratory failure -Continue BiPAP -Likely secondary to ongoing pneumonia -Chronic muscle weakness  Sepsis -Leukocytosis/tachycardia/underlying respiratory infection  Community-acquired pneumonia -On appropriate antibiotics on azithromycin and Rocephin  Chronic respiratory failure secondary to history of polio, diaphragmatic palsy -Patient uses a trilogy ventilator at home  Anxiety -Continue home Zoloft, bupropion, Ativan  Debility secondary to post polio syndrome -He is bedbound, wheelchair dependent   Patient is DNR Continue antibiotics Continue BiPAP for support  Best practice:  Diet: As tolerated Pain/Anxiety/Delirium protocol (if indicated): As needed VAP protocol (if indicated): Not indicated DVT prophylaxis: Lovenox Mobility: Bedrest Code Status: Full code  Family Communication: Defer to primary Disposition: Stepdown  Labs   CBC: Recent Labs  Lab 12/20/2019 1428 01/06/20 0719 01/07/20 0631 01/07/20 1104 01/08/20 0303  WBC 19.4* 20.9* 22.8*  --  23.0*  NEUTROABS 10.2* 9.9* 10.8*  --  9.5*  HGB 13.9 13.5 13.1 14.6 12.7*  HCT 42.4 41.9 41.3 43.0 40.9  MCV 97.5 97.7 99.0  --  101.2*  PLT 314 333 329  --  376    Basic Metabolic Panel: Recent Labs  Lab 12/28/2019 1428 01/06/20 0719 01/07/20 1104 01/08/20 0303  NA 138 141 142 142  K 3.7 3.5  3.4* 3.8  CL 100 104  --  107  CO2 28 25  --  25  GLUCOSE 116* 104*  --  113*  BUN 21 19  --  17  CREATININE 0.59* 0.55*  --  0.43*  CALCIUM 8.6* 8.8*  --   8.9  MG  --  1.9  --   --    GFR: Estimated Creatinine Clearance: 74.1 mL/min (A) (by C-G formula based on SCr of 0.43 mg/dL (L)). Recent Labs  Lab 12/12/2019 1428 01/06/20 0719 01/07/20 0631 01/08/20 0303  PROCALCITON  --   --   --  0.16  WBC 19.4* 20.9* 22.8* 23.0*  LATICACIDVEN 1.6  --   --   --     Liver Function Tests: Recent Labs  Lab 12/24/2019 1428  AST 28  ALT 20  ALKPHOS 112  BILITOT 0.4  PROT 6.7  ALBUMIN 3.3*   No results for input(s): LIPASE, AMYLASE in the last 168 hours. No results for input(s): AMMONIA in the last 168 hours.  ABG    Component Value Date/Time   PHART 7.285 (L) 01/08/2020 0900   PCO2ART 56.7 (H) 01/08/2020 0900   PO2ART 93.0 01/08/2020 0900   HCO3 26.1 01/08/2020 0900   TCO2 32 01/07/2020 1104   ACIDBASEDEF 1.1 01/08/2020 0900   O2SAT 97.4 01/08/2020 0900     Coagulation Profile: No results for input(s): INR, PROTIME in the last 168 hours.  Cardiac Enzymes: No results for input(s): CKTOTAL, CKMB, CKMBINDEX, TROPONINI in the last 168 hours.  HbA1C: No results found for: HGBA1C  CBG: No results for input(s): GLUCAP in the last 168 hours.  Review of Systems:   Shortness of breath feels a little bit better Tolerating BiPAP okay Patient used to using trilogy ventilator at home  Past Medical History  He,  has a past medical history of Anxiety, CLL (chronic lymphocytic leukemia) ([place]) (02/20/2014), Osteoporosis, Pneumonia, Polio, and Scoliosis.   Surgical History    Past Surgical History:  Procedure Laterality Date  . CATARACT EXTRACTION  09/2011   at [facility]  . COLONOSCOPY  03/2007  . RETINAL DETACHMENT SURGERY  09/1995  . SPINAL FUSION  10/1952  . TONSILLECTOMY    . TRANSURETHRAL RESECTION OF PROSTATE N/A 06/10/2018   Procedure: TRANSURETHRAL RESECTION OF THE PROSTATE (TURP);  Surgeon: Kathie Rhodes, MD;  Location: [facility];  Service: Urology;  Laterality: N/A;     Social History   reports that he has quit smoking. He  has never used smokeless tobacco. He reports current alcohol use. He reports that he does not use drugs.   Family History   His family history includes Colon cancer (age of onset: 49) in his sister.   Allergies Allergies  Allergen Reactions  . Penicillins Rash    Has patient had a PCN reaction causing immediate rash, facial/tongue/throat swelling, SOB or lightheadedness with hypotension: Y Has patient had a PCN reaction causing severe rash involving mucus membranes or skin necrosis: N Has patient had a PCN reaction that required hospitalization: N Has patient had a PCN reaction occurring within the last 10 years: N If all of the above answers are "NO", then may proceed with Cephalosporin use.       Sherrilyn Rist, MD [facility] Pager: [phone]

## 2020-01-08 NOTE — Progress Notes (Addendum)
PROGRESS NOTE    Manuel Reeves  ZOX:[id number] DOB: 01/29/1933 DOA: 12/23/2019 PCP: Crist Infante, MD   Brief Narrative:  Patient is 86 year old male with history of poliomyelitis, scoliosis, osteoporosis, CLL, anxiety who presented from independent living facility presented with low-grade fever, cough.  He has been bedbound at baseline since last several years.  He has home health aide to take care of him for most hours of the day, uses Contractor wheelchair and a trilogy device at night.  As per the wife, he had progressive decline in his physical strength in last several months. He has developed bowel incontinence since he had TURP. He was also found to be confused by his wife since last few days.  On presentation, he had mild fever, was in sinus tachycardia with respiratory rate of 20-30 and requiring 2 L of oxygen.  He had leukocytosis.  Urinalysis was not impressive of UTI.  Chest x-ray showed layering bilateral effusions with bilateral atelectasis or infiltrates right greater than left.  Patient was admitted for management of possible community-acquired pneumonia. Patient went into acute respiratory distress on the morning of 01/07/20 and had to be transferred to stepdown, put on BiPAP.  PCCM consulted today.  Assessment & Plan:   Active Problems:   Pneumonia  Acute respiratory failure with hypoxia: Required oxygen supplementation for maintenance of saturation.  He was  on 2 L of oxygen per minute.  He is not on oxygen at home. Patient went into acute respiratory distress this morning and had to be transferred to stepdown, currently on BiPAP.  ABG showed hypercarbia.  CT angiogram did not show any PE but showed bubtotal atelectasis of BILATERAL lower lobes with patchy infiltrates LEFT upper lobe.  We  tried to wean him off BiPAP today but we were not successful.  ABG done today showed improved hypercarbia but he still has acidosis.  Respiratory therapist following.  PCCM  consulted today.  His acute respiratory failure is multifactorial from pneumonia, sequelae of poliomyelitis and scoliosis.  Sepsis: Met SIRS criteria with tachycardia, leukocytosis but no evidence of organ dysfunction.  Has leukocytosis and it is worsening.  Will check procalcitonin.  Continue current antibiotics.  Follow-up blood cultures.  IV fluids have been discontinued.  Community-acquired pneumonia: Presented with low-grade fever, wet cough, confusion.  Chest x-ray on admission showed layering bilateral effusions with bilateral atelectasis or infiltrates right greater than left.  Continue azithromycin and Rocephin.  Continue mucolytics for cough.  Incentive spirometry, bronchodilators as needed.  He still has cough but afebrile today. Aspiration pneumonia is also a possibility. We have requested for speech therapy evaluation.  He denies any problem with swallowing. CT and chest x-ray findings done today showed possibility of bilateral lower lungs pneumonia  History of chronic respiratory lung disease: Secondary to poliomyelitis, sclerosis, diaphragmatic palsy.  Uses trilogy device at night and as needed during the day.  Continue Trelegy device  Elevated D-dimer: Mild.  Low suspicion for thromboembolic disease.  CT angiogram did not show any PE.  Anxiety: Wife reported episodes of restlessness, agitation.  On Zoloft, bupropion and Ativan at home which we will continue.  Poliomyelitis:Diagnosed with polio when he was 63. 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  Debility/deconditioning/generalised weakness: History of poliomyelitis with bedbound status.  Uses wheelchair for ambulation.  Continue supportive care.  Has good support at home.We have requested  for PT/OT evaluation.         DVT prophylaxis:Lovenox Code Status: Full Family Communication: Called and discussed with  son on phone on 01/08/20.  Also discussed with wife at bedside. Status is: inpatient    Dispo: The patient is from: Home              Anticipated d/c is to: Home versus skilled nursing facility.              Anticipated d/c date is: 2-3 days              Patient currently is not medically stable to d/c.  On BiPAP   Consultants: None  Procedures: None  Antimicrobials:  Anti-infectives (From admission, onward)   Start     Dose/Rate Route Frequency Ordered Stop   01/06/20 1800  cefTRIAXone (ROCEPHIN) 1 g in sodium chloride 0.9 % 100 mL IVPB        1 g 200 mL/hr over 30 Minutes Intravenous Every 24 hours 01/07/2020 1748     01/06/20 1800  azithromycin (ZITHROMAX) 500 mg in sodium chloride 0.9 % 250 mL IVPB        500 mg 250 mL/hr over 60 Minutes Intravenous Every 24 hours 12/26/2019 1748     01/07/2020 1530  cefTRIAXone (ROCEPHIN) 1 g in sodium chloride 0.9 % 100 mL IVPB        1 g 200 mL/hr over 30 Minutes Intravenous  Once 12/13/2019 1517 01/03/2020 1816   12/20/2019 1530  azithromycin (ZITHROMAX) 500 mg in sodium chloride 0.9 % 250 mL IVPB        500 mg 250 mL/hr over 60 Minutes Intravenous  Once 12/26/2019 1517 12/24/2019 1852      Subjective:  Patient seen and examined the bedside this morning.  Hemodynamically stable during my evaluation but he still on BiPAP.  BiPAP tried to be weaned today but was not successful.  He was not in any kind of respite distress during my evaluation while he was on BiPAP.  Objective: Vitals:   01/08/20 0000 01/08/20 0327 01/08/20 0400 01/08/20 0405  BP: (!) 152/65   (!) 162/71  Pulse: 84  85 85  Resp: _0 Temp:  97.8 F (36.6 C)    TempSrc:  Axillary    SpO2: 100%  100% 100%  Weight:      Height:        Intake/Output Summary (Last 24 hours) at 01/08/2020 0748 Last data filed at 01/08/2020 0645 Gross per 24 hour  Intake 818 ml  Output 450 ml  Net 368 ml   Filed Weights   12/18/2019 1840  Weight: 83.5 kg    Examination:  General exam:  Deconditioned, debilitated Respiratory system: On BiPAP, no wheezes or crackles, scoliosis  cardiovascular system: S1 & S2 heard, RRR. No JVD, murmurs, rubs, gallops or clicks. Gastrointestinal system: Abdomen is nondistended, soft and nontender. No organomegaly or masses felt. Normal bowel sounds heard. Central nervous system: Alert and oriented.  Paraplegia. Extremities: No edema, no clubbing ,no cyanosis, deformities of the upper extremities, strictures/atrophy of the lower extremities. Skin: No rashes, lesions or ulcers,no icterus ,no pallor   Data Reviewed: I have personally reviewed following labs and imaging studies  CBC: Recent Labs  Lab 12/29/2019 1428 01/06/20 0719 01/07/20 0631 01/07/20 1104 01/08/20 0303  WBC 19.4* 20.9* 22.8*  --  23.0*  NEUTROABS 10.2* 9.9* 10.8*  --  9.5*  HGB 13.9 13.5 13.1 14.6 12.7*  HCT 42.4  41.9 41.3 43.0 40.9  MCV 97.5 97.7 99.0  --  101.2*  PLT 314 333 329  --  086   Basic Metabolic Panel: Recent Labs  Lab 01/04/2020 1428 01/06/20 0719 01/07/20 1104 01/08/20 0303  NA 138 141 142 142  K 3.7 3.5 3.4* 3.8  CL 100 104  --  107  CO2 28 25  --  25  GLUCOSE 116* 104*  --  113*  BUN 21 19  --  17  CREATININE 0.59* 0.55*  --  0.43*  CALCIUM 8.6* 8.8*  --  8.9  MG  --  1.9  --   --    GFR: Estimated Creatinine Clearance: 74.1 mL/min (A) (by C-G formula based on SCr of 0.43 mg/dL (L)). Liver Function Tests: Recent Labs  Lab 12/31/2019 1428  AST 28  ALT 20  ALKPHOS 112  BILITOT 0.4  PROT 6.7  ALBUMIN 3.3*   No results for input(s): LIPASE, AMYLASE in the last 168 hours. No results for input(s): AMMONIA in the last 168 hours. Coagulation Profile: No results for input(s): INR, PROTIME in the last 168 hours. Cardiac Enzymes: No results for input(s): CKTOTAL, CKMB, CKMBINDEX, TROPONINI in the last 168 hours. BNP (last 3 results) No results for input(s): PROBNP in the last 8760 hours. HbA1C: No results for input(s): HGBA1C in the last  72 hours. CBG: No results for input(s): GLUCAP in the last 168 hours. Lipid Profile: No results for input(s): CHOL, HDL, LDLCALC, TRIG, CHOLHDL, LDLDIRECT in the last 72 hours. Thyroid Function Tests: No results for input(s): TSH, T4TOTAL, FREET4, T3FREE, THYROIDAB in the last 72 hours. Anemia Panel: No results for input(s): VITAMINB12, FOLATE, FERRITIN, TIBC, IRON, RETICCTPCT in the last 72 hours. Sepsis Labs: Recent Labs  Lab 12/23/2019 1428  LATICACIDVEN 1.6    Recent Results (from the past 240 hour(s))  Urine culture     Status: Abnormal   Collection Time: 01/02/2020  2:06 PM   Specimen: Urine, Clean Catch  Result Value Ref Range Status   Specimen Description   Final    URINE, CLEAN CATCH Performed at [facility], [place] [address], [place], [place] [address]    Special Requests   Final    NONE Performed at [facility], [place] [address], [place], [place] [address]    Culture MULTIPLE SPECIES PRESENT, SUGGEST RECOLLECTION (A)  Final   Report Status 01/07/2020 FINAL  Final  Blood culture (routine x 2)     Status: None (Preliminary result)   Collection Time: 12/15/2019  2:28 PM   Specimen: BLOOD  Result Value Ref Range Status   Specimen Description   Final    BLOOD BLOOD LEFT FOREARM Performed at [facility] [address] [place], [place] [address]    Special Requests   Final    BOTTLES DRAWN AEROBIC AND ANAEROBIC Blood Culture adequate volume Performed at [facility] [address], [place], [place] [address]    Culture   Final    NO GROWTH 2 DAYS Performed at [facility] [address] [place], [place] [address]    Report Status PENDING  Incomplete  SARS Coronavirus 2 by RT PCR (hospital order, performed in [facility] hospital lab) Nasopharyngeal Nasopharyngeal Swab     Status: None   Collection Time: 12/14/2019  2:28 PM   Specimen: Nasopharyngeal Swab  Result Value Ref Range Status     SARS Coronavirus 2 NEGATIVE NEGATIVE Final  Comment: (NOTE) SARS-CoV-2 target nucleic acids are NOT DETECTED.  The SARS-CoV-2 RNA is generally detectable in upper and lower respiratory specimens during the acute phase of infection. The lowest concentration of SARS-CoV-2 viral copies this assay can detect is 250 copies / mL. A negative result does not preclude SARS-CoV-2 infection and should not be used as the sole basis for treatment or other patient management decisions.  A negative result may occur with improper specimen collection / handling, submission of specimen other than nasopharyngeal swab, presence of viral mutation(s) within the areas targeted by this assay, and inadequate number of viral copies (<250 copies / mL). A negative result must be combined with clinical observations, patient history, and epidemiological information.  Fact Sheet for Patients:   StrictlyIdeas.no  Fact Sheet for Healthcare Providers: BankingDealers.co.za  This test is not yet approved or  cleared by the [place] FDA and has been authorized for detection and/or diagnosis of SARS-CoV-2 by FDA under an Emergency Use Authorization (EUA).  This EUA will remain in effect (meaning this test can be used) for the duration of the COVID-19 declaration under Section 564(b)(1) of the Act, 21 U.S.C. section 360bbb-3(b)(1), unless the authorization is terminated or revoked sooner.  Performed at [facility], [place] [address], [place], [place] [address]   Blood culture (routine x 2)     Status: None (Preliminary result)   Collection Time: 12/18/2019  2:29 PM   Specimen: BLOOD  Result Value Ref Range Status   Specimen Description   Final    BLOOD LEFT HAND Performed at [facility] [address], [place], [place] [address]    Special Requests   Final    BOTTLES DRAWN AEROBIC AND ANAEROBIC Blood Culture adequate  volume Performed at [facility] [address] [place], [place] [address]    Culture   Final    NO GROWTH 2 DAYS Performed at [facility] [address] [place], [place] [address]    Report Status PENDING  Incomplete         Radiology Studies: DG Chest 1 View  Result Date: 01/07/2020 CLINICAL DATA:  Shortness of breath. History of scoliosis, osteoporosis and poliomyelitis. Now with low-grade fever and cough EXAM: CHEST  1 VIEW COMPARISON:  12/15/2019 FINDINGS: Limited exam secondary to profound kyphoscoliosis deformity with secondary rotational artifact. There is diminished aeration to both lung bases compatible with atelectasis and airspace consolidation. IMPRESSION: 1. Limited exam. Diminished aeration to both lower lung zones compatible with atelectasis and or pneumonia. Electronically Signed   By: Kerby Moors M.D.   On: 01/07/2020 11:45   CT ANGIO CHEST PE W OR WO CONTRAST  Result Date: 01/07/2020 CLINICAL DATA:  Shortness of breath, severe drop in oxygen saturation, high clinical probability of pulmonary embolism EXAM: CT ANGIOGRAPHY CHEST WITH CONTRAST TECHNIQUE: Multidetector CT imaging of the chest was performed using the standard protocol during bolus administration of intravenous contrast. Multiplanar CT image reconstructions and MIPs were obtained to evaluate the vascular anatomy. CONTRAST:  119m OMNIPAQUE IOHEXOL 350 MG/ML SOLN IV COMPARISON:  06/29/2018 FINDINGS: Cardiovascular: Severe thoracic deformity secondary to scoliosis. Tortuous aorta. Aneurysmal dilatation ascending thoracic aorta 4.1 cm transverse image 51. No aortic dissection. Heart unremarkable. No pericardial effusion. Pulmonary arteries adequately opacified. Respiratory motion artifacts at the lower lobes. No definite pulmonary emboli identified. Mediastinum/Nodes: Base of cervical region unremarkable. No esophageal abnormalities. No thoracic adenopathy. Lungs/Pleura: Subtotal  atelectasis of BILATERAL lower lobes. Patchy infiltrates LEFT upper  lobe. Remaining lungs clear. No pleural effusion or pneumothorax. Upper Abdomen: Hepatic cyst LEFT lobe 2.4 x 2.3 cm unchanged. Remaining visualized upper abdomen unremarkable Musculoskeletal: Marked dextroconvex thoracic scoliosis. Osseous demineralization. Severe thoracic deformity. No acute bone lesions. Review of the MIP images confirms the above findings. IMPRESSION: No definite evidence of pulmonary embolism, with assessment of the lower lobes limited by respiratory motion. Aneurysmal dilatation of the ascending thoracic aorta 4.1 cm transverse; Recommend annual imaging followup by CTA or MRA. This recommendation follows 2010 ACCF/AHA/AATS/ACR/ASA/SCA/SCAI/SIR/STS/SVM Guidelines for the Diagnosis and Management of Patients with Thoracic Aortic Disease. Circulation. 2010; 121: N470-J628. Aortic aneurysm NOS (ICD10-I71.9) Subtotal atelectasis of BILATERAL lower lobes with patchy infiltrates LEFT upper lobe question pneumonia. Severe thoracic deformity secondary to scoliosis. Aortic Atherosclerosis (ICD10-I70.0). Electronically Signed   By: Lavonia Dana M.D.   On: 01/07/2020 12:17        Scheduled Meds: . acetylcysteine  2 mL Nebulization TID  . albuterol  2.5 mg Nebulization TID  . buPROPion  150 mg Oral Daily  . chlorhexidine  15 mL Mouth Rinse BID  . Chlorhexidine Gluconate Cloth  6 each Topical Daily  . enoxaparin (LOVENOX) injection  40 mg Subcutaneous Q24H  . guaiFENesin-dextromethorphan  10 mL Oral Q6H  . LORazepam  1 mg Oral TID  . mouth rinse  15 mL Mouth Rinse q12n4p  . multivitamin with minerals  1 tablet Oral Daily  . potassium chloride  40 mEq Oral Once  . senna  1 tablet Oral BID  . sertraline  50 mg Oral Daily  . sodium chloride flush  3 mL Intravenous Q12H   Continuous Infusions: . azithromycin Stopped (01/07/20 1854)  . cefTRIAXone (ROCEPHIN)  IV Stopped (01/07/20 1742)     LOS: 2 days    Time spent:  35 mins.More than 50% of that time was spent in counseling and/or coordination of care.      Shelly Coss, MD [facility] P8/30/2021, 7:48 AM

## 2020-01-08 NOTE — Progress Notes (Signed)
OT Cancellation Note  Patient Details Name: MILLARD BAUTCH MRN: [id number] DOB: [birth year]   Cancelled Treatment:    Reason Eval/Treat Not Completed: Medical issues which prohibited therapy  Will check on pt next day Kari Baars, [place] [phone] Office- [phone], Thereasa Parkin 01/08/2020, 1:22 PM

## 2020-01-08 NOTE — Evaluation (Signed)
SLP Cancellation Note  Patient Details Name: Manuel Reeves MRN: [id number] DOB: 11/26/1932   Cancelled treatment:       Reason Eval/Treat Not Completed: Other (comment);Medical issues which prohibited therapy (pt remains on bipap at 0930, per RN he is not tolerating being off bipap)   Macario Golds 01/08/2020, 10:46 AM

## 2020-01-09 ENCOUNTER — Inpatient Hospital Stay (HOSPITAL_COMMUNITY): Payer: Medicare Other

## 2020-01-09 LAB — CBC WITH DIFFERENTIAL/PLATELET
Abs Immature Granulocytes: 0.88 10*3/uL — ABNORMAL HIGH (ref 0.00–0.07)
Basophils Absolute: 0 10*3/uL (ref 0.0–0.1)
Basophils Relative: 0 %
Eosinophils Absolute: 0 10*3/uL (ref 0.0–0.5)
Eosinophils Relative: 0 %
HCT: 43.8 % (ref 39.0–52.0)
Hemoglobin: 13.6 g/dL (ref 13.0–17.0)
Immature Granulocytes: 3 %
Lymphocytes Relative: 55 %
Lymphs Abs: 17.9 10*3/uL — ABNORMAL HIGH (ref 0.7–4.0)
MCH: 31.3 pg (ref 26.0–34.0)
MCHC: 31.1 g/dL (ref 30.0–36.0)
MCV: 100.9 fL — ABNORMAL HIGH (ref 80.0–100.0)
Monocytes Absolute: 0.8 10*3/uL (ref 0.1–1.0)
Monocytes Relative: 2 %
Neutro Abs: 13 10*3/uL — ABNORMAL HIGH (ref 1.7–7.7)
Neutrophils Relative %: 40 %
Platelets: 391 10*3/uL (ref 150–400)
RBC: 4.34 MIL/uL (ref 4.22–5.81)
RDW: 14 % (ref 11.5–15.5)
WBC: 32.6 10*3/uL — ABNORMAL HIGH (ref 4.0–10.5)
nRBC: 0 % (ref 0.0–0.2)

## 2020-01-09 LAB — GLUCOSE, CAPILLARY: Glucose-Capillary: 113 mg/dL — ABNORMAL HIGH (ref 70–99)

## 2020-01-09 LAB — BASIC METABOLIC PANEL
Anion gap: 16 — ABNORMAL HIGH (ref 5–15)
BUN: 22 mg/dL (ref 8–23)
CO2: 21 mmol/L — ABNORMAL LOW (ref 22–32)
Calcium: 9.3 mg/dL (ref 8.9–10.3)
Chloride: 107 mmol/L (ref 98–111)
Creatinine, Ser: 0.58 mg/dL — ABNORMAL LOW (ref 0.61–1.24)
GFR calc Af Amer: >60 mL/min (ref >60)
GFR calc non Af Amer: >60 mL/min (ref >60)
Glucose, Bld: 117 mg/dL — ABNORMAL HIGH (ref 70–99)
Potassium: 4.1 mmol/L (ref 3.5–5.1)
Sodium: 144 mmol/L (ref 135–145)

## 2020-01-09 LAB — LEGIONELLA PNEUMOPHILA SEROGP 1 UR AG: L. pneumophila Serogp 1 Ur Ag: NEGATIVE

## 2020-01-09 LAB — PROCALCITONIN: Procalcitonin: <0.1 ng/mL

## 2020-01-09 MED ORDER — SODIUM CHLORIDE 0.9 % IV SOLN
2.0000 g | Freq: Three times a day (TID) | INTRAVENOUS | Status: AC
  Administered 2020-01-09 – 2020-01-11 (×6): 2 g via INTRAVENOUS
  Filled 2020-01-09 (×6): qty 2

## 2020-01-09 MED ORDER — VANCOMYCIN HCL 1750 MG/350ML IV SOLN
1750.0000 mg | Freq: Once | INTRAVENOUS | Status: AC
  Administered 2020-01-09: 1750 mg via INTRAVENOUS
  Filled 2020-01-09: qty 350

## 2020-01-09 MED ORDER — VANCOMYCIN HCL 750 MG/150ML IV SOLN
750.0000 mg | Freq: Two times a day (BID) | INTRAVENOUS | Status: AC
  Administered 2020-01-09 – 2020-01-10 (×3): 750 mg via INTRAVENOUS
  Filled 2020-01-09 (×3): qty 150

## 2020-01-09 NOTE — Progress Notes (Signed)
RN noted patient O2 having drops down to low 80s. RT notifed and FiO2 increased 80%. Over next hour patient still having drops in O2. Patient increased to 100% FiO2. TRH MD aware and RN to notify Critical care of increase of BIPAP settings

## 2020-01-09 NOTE — Progress Notes (Signed)
50 cc urine output for shift at 1700- RN bladder scanned patient- 246 cc in bladder. MD notified and orders received for in/out cath. 400 mL from in/out recorded.

## 2020-01-09 NOTE — Progress Notes (Signed)
° °  NAME:  Manuel Reeves, MRN:  [id number], DOB:  [birth year], LOS: 3 ADMISSION DATE:  01/07/2020, CONSULTATION DATE: 01/08/2020 REFERRING MD: Dr. Tawanna Solo, CHIEF COMPLAINT: Hypoxemic respiratory failure  Brief History   Asked to see patient for hypoxemic respiratory failure   Past Medical History   Past Medical History:  Diagnosis Date   Anxiety    CLL (chronic lymphocytic leukemia) ([place]) 02/20/2014   Osteoporosis    Pneumonia    Polio    Scoliosis      Significant Hospital Events   On BiPAP  Consults:  pccm  Procedures:  None  Significant Diagnostic Tests:  ABG-7.28 5/57/93 CT scan of the chest performed 8/29 Reviewed by myself showing bibasilar atelectasis  Micro Data:  Blood culture 8/27-no organism so far Urine culture 8/27-no organism  Antimicrobials:  Azithromycin 8/27>> Ceftriaxone 8/27>>  Interim history/subjective:  Still w/ sig WOB  Objective   Blood pressure (Abnormal) 159/77, pulse (Abnormal) 106, temperature (Abnormal) 97.1 F (36.2 C), temperature source Axillary, resp. rate (Abnormal) 30, height 6' (1.829 m), weight 83.5 kg, SpO2 91 %.    FiO2 (%):  [50 %-80 %] 50 %   Intake/Output Summary (Last 24 hours) at 01/09/2020 1116 Last data filed at 01/09/2020 0600 Gross per 24 hour  Intake 100.06 ml  Output 300 ml  Net -199.94 ml   Filed Weights   12/16/2019 1840  Weight: 83.5 kg    Examination:  General this is remarkable for ill-appearing white male who is still on noninvasive ventilatory support with fairly significant increased work of breathing HENT NCAT BIPAP mask in place Pulm sig decreased t/o. Still on high level support via BIPAP  Card RRR.  abd not tender + bowel sounds Ext warm and dry  gu clear yellow   Resolved Hospital Problem list     Assessment & Plan:   Acute hypoxemic/hypercapnic respiratory failure in the setting of community acquired pneumonia superimposed on underlying postpolio syndrome with  diaphragmatic palsy as well as restrictive lung disease from scoliosis -Not really improving much still requiring significant positive pressure support -cough mechanics very poor Portable chest x-ray personally reviewed: Persistent right sided airspace disease/element of collapse left basilar volume loss as well cannot exclude element of right effusion no significant improvement Plan Continuing BiPAP, he needs to have mandatory rest every 4 hours off of BiPAP shooting for at least 30 minutes Scheduled bronchodilators He is on antibiotic day #5, he was on ceftriaxone and azithromycin, he has been changed to vancomycin and cefepime.  I am not convinced were missing a bug here, I think this is more deconditioning and the consequences of an acute illness superimposed on such a limited baseline functional status We will try IV Lasix today Little more to offer other than supportive care, it certainly possible this could be a terminal illness Trend cbc  Anxiety Plan  Continue home medications  continue home Zoloft, bupropion, Ativan  Debility secondary to post polio syndrome -He is bedbound, wheelchair dependent Plan Supportive care  Patient is DNR Continue antibiotics Continue BiPAP for support  Best practice:  Diet: As tolerated Pain/Anxiety/Delirium protocol (if indicated): As needed VAP protocol (if indicated): Not indicated DVT prophylaxis: Lovenox Mobility: Bedrest Code Status: Full code  Family Communication: Defer to primary Disposition: [facility] Pager # [phone] OR # [phone] if no answer

## 2020-01-09 NOTE — Progress Notes (Signed)
OT Cancellation Note  Patient Details Name: Manuel Reeves MRN: [id number] DOB: 1932/06/17   Cancelled Treatment:    Reason Eval/Treat Not Completed: Medical issues which prohibited therapy. Patient still with confusion, on Bipap. Spoke with RN who reports patient has had functional decline for past 15 years, wheelchair dependent for mobility. Will follow up tomorrow as schedule permits.  Delbert Phenix OT OT pager: [phone]   Rosemary Holms 01/09/2020, 12:05 PM

## 2020-01-09 NOTE — Progress Notes (Addendum)
PROGRESS NOTE    Manuel Reeves  DVV:[id number] DOB: 06/11/1932 DOA: 01/07/2020 PCP: Crist Infante, MD   Brief Narrative:  Patient is 86 year old male with history of poliomyelitis, scoliosis, osteoporosis, CLL, anxiety who presented from independent living facility presented with low-grade fever, cough.  He has been bedbound at baseline since last several years.  He has home health aide to take care of him for most hours of the day, uses Contractor wheelchair and a trilogy device at night.  As per the wife, he had progressive decline in his physical strength in last several months. He has developed bowel incontinence since he had TURP. He was also found to be confused by his wife since last few days.  On presentation, he had mild fever, was in sinus tachycardia with respiratory rate of 20-30 and requiring 2 L of oxygen.  He had leukocytosis.  Urinalysis was not impressive of UTI.  Chest x-ray showed layering bilateral effusions with bilateral atelectasis or infiltrates right greater than left.  Patient was admitted for management of possible community-acquired pneumonia. Patient went into acute respiratory distress on the morning of 01/07/20 and had to be transferred to stepdown, put on BiPAP.  PCCM consulted and following.Unable to wean him off bipap and requiring 100% FiO2 today.  Assessment & Plan:   Active Problems:   Pneumonia  Acute respiratory failure with hypoxia: Required oxygen supplementation for maintenance of saturation.  He was  on 2 L of oxygen per minute.  He is not on oxygen at home. Patient went into acute respiratory distress in the morning of 01/07/20 and had to be transferred to stepdown, currently on BiPAP.  ABG showed hypercarbia.  CT angiogram did not show any PE but showed atelectasis of BILATERAL lower lobes with patchy infiltrates LEFT upper lobe.  We  tried to wean him off BiPAP  but we were not successful.   PCCM consulted.  His acute respiratory failure  is multifactorial from pneumonia, sequelae of poliomyelitis and scoliosis. Chest x-ray done today showed bibasilar atelectasis/infiltrates and small pleural effusions .  He has been BiPAP dependent now and requiring more FiO2.  Sepsis: Met SIRS criteria with tachycardia, leukocytosis but no evidence of organ dysfunction.  Has leukocytosis and it is worsening.  Mildly elevated procalcitonin.  Antibiotics upgraded to vancomycin and cefepime.  Follow-up blood cultures. NGTD. IV fluids have been discontinued. Follow-up leukocytosis trend.  Community-acquired pneumonia: Presented with low-grade fever, wet cough, confusion.  Chest x-ray on admission showed layering bilateral effusions with bilateral atelectasis or infiltrates right greater than left. Was on  azithromycin and Rocephin but now changed to vancomycin and cefepime because of worsening respiratory status..  Continue mucolytics for cough.  Incentive spirometry, bronchodilators as needed.  As per the family, he has history of chronic dysphagia but patient denies.  Speech therapy is also following. CT and chest x-ray done on 01/07/20 showed possibility of bilateral lower lungs pneumonia. Chest x-ray done today showed bibasilar atelectasis/infiltrates and small pleural Effusions.  History of chronic respiratory lung disease: Secondary to poliomyelitis, scoliosis, diaphragmatic palsy.  Uses trilogy device at night and as needed during the day.    Elevated D-dimer: Mild.  Low suspicion for thromboembolic disease.  CT angiogram did not show any PE.  Anxiety: Wife reported episodes of restlessness, agitation.  On Zoloft, bupropion and Ativan at home which we will continue.  Poliomyelitis:Diagnosed with polio when he was 3. 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  Debility/deconditioning/generalised weakness: History of poliomyelitis  with bedbound status.  Uses wheelchair for ambulation.  Continue supportive care.  Has good support at home.We have requested  for PT/OT evaluation.  Goals of care: 86 year old with multiple comorbidities, bedbound status, severely debilitated.  Discussed with son extensively about goals of care.  He is  open for discussion about comfort care/hospice if he is overall condition does not improve in the next 24 to 48 hours.         DVT prophylaxis:Lovenox Code Status: DNR Family Communication: Called and discussed with son  on phone on 01/09/20. Status is: inpatient    Dispo: The patient is from: Home              Anticipated d/c is to: Home versus skilled nursing facility.              Anticipated d/c date is: Not sure at this moment               Patient currently is not medically stable to d/c.  On BiPAP.  Patient is critically ill   Consultants:PCCM  Procedures: None  Antimicrobials:  Anti-infectives (From admission, onward)   Start     Dose/Rate Route Frequency Ordered Stop   01/06/20 1800  cefTRIAXone (ROCEPHIN) 1 g in sodium chloride 0.9 % 100 mL IVPB        1 g 200 mL/hr over 30 Minutes Intravenous Every 24 hours 12/22/2019 1748     01/06/20 1800  azithromycin (ZITHROMAX) 500 mg in sodium chloride 0.9 % 250 mL IVPB        500 mg 250 mL/hr over 60 Minutes Intravenous Every 24 hours 12/24/2019 1748     12/26/2019 1530  cefTRIAXone (ROCEPHIN) 1 g in sodium chloride 0.9 % 100 mL IVPB        1 g 200 mL/hr over 30 Minutes Intravenous  Once 12/16/2019 1517 12/18/2019 1816   12/10/2019 1530  azithromycin (ZITHROMAX) 500 mg in sodium chloride 0.9 % 250 mL IVPB        500 mg 250 mL/hr over 60 Minutes Intravenous  Once 12/13/2019 1517 12/13/2019 1852      Subjective:  Patient seen and examined the bedside this morning.  His respiratory status worsened today.  He is requiring 100% FiO2 and was in moderate respiratory distress during my evaluation.  His mental status also worsened and he was  not able to open his eyes when calling his name.  He is maintaining saturation at 100% at maximum FiO2.  Objective: Vitals:   01/09/20 0400 01/09/20 0444 01/09/20 0500 01/09/20 0600  BP: (!) 91/46 123/73 125/74   Pulse: 87 98 91 100  Resp: 18 (!) _0 Temp:      TempSrc:      SpO2: 100% 93% 98% 100%  Weight:      Height:        Intake/Output Summary (Last 24 hours) at 01/09/2020 0731 Last data filed at 01/09/2020 0600 Gross per 24 hour  Intake 100.06 ml  Output 300 ml  Net -199.94 ml   Filed Weights   01/07/2020 1840  Weight: 83.5 kg    Examination:  General exam: In moderate to severe distress, deconditioned, debilitated Respiratory system: Bilateral diminished air sounds, BiPAP  cardiovascular system: Sinus tach,. No JVD, murmurs, rubs, gallops or clicks. Gastrointestinal system: Abdomen is nondistended, soft and nontender. No organomegaly or masses  felt. Normal bowel sounds heard. Central nervous system: Not Alert and oriented.  Paraplegia Extremities: No edema, no clubbing ,no cyanosis, atrophy of muscles of lower extremities, deformities of hands Skin: No rashes, lesions or ulcers,no icterus ,no pallor    Data Reviewed: I have personally reviewed following labs and imaging studies  CBC: Recent Labs  Lab 12/26/2019 1428 01/04/2020 1428 01/06/20 0719 01/07/20 0631 01/07/20 1104 01/08/20 0303 01/09/20 0255  WBC 19.4*  --  20.9* 22.8*  --  23.0* 32.6*  NEUTROABS 10.2*  --  9.9* 10.8*  --  9.5* 13.0*  HGB 13.9   < > 13.5 13.1 14.6 12.7* 13.6  HCT 42.4   < > 41.9 41.3 43.0 40.9 43.8  MCV 97.5  --  97.7 99.0  --  101.2* 100.9*  PLT 314  --  333 329  --  317 391   < > = values in this interval not displayed.   Basic Metabolic Panel: Recent Labs  Lab 01/01/2020 1428 01/06/20 0719 01/07/20 1104 01/08/20 0303 01/09/20 0255  NA 138 141 142 142 144  K 3.7 3.5 3.4* 3.8 4.1  CL 100 104  --  107 107  CO2 28 25  --  25 21*  GLUCOSE 116* 104*  --  113* 117*  BUN  21 19  --  17 22  CREATININE 0.59* 0.55*  --  0.43* 0.58*  CALCIUM 8.6* 8.8*  --  8.9 9.3  MG  --  1.9  --   --   --    GFR: Estimated Creatinine Clearance: 74.1 mL/min (A) (by C-G formula based on SCr of 0.58 mg/dL (L)). Liver Function Tests: Recent Labs  Lab 12/29/2019 1428  AST 28  ALT 20  ALKPHOS 112  BILITOT 0.4  PROT 6.7  ALBUMIN 3.3*   No results for input(s): LIPASE, AMYLASE in the last 168 hours. No results for input(s): AMMONIA in the last 168 hours. Coagulation Profile: No results for input(s): INR, PROTIME in the last 168 hours. Cardiac Enzymes: No results for input(s): CKTOTAL, CKMB, CKMBINDEX, TROPONINI in the last 168 hours. BNP (last 3 results) No results for input(s): PROBNP in the last 8760 hours. HbA1C: No results for input(s): HGBA1C in the last 72 hours. CBG: No results for input(s): GLUCAP in the last 168 hours. Lipid Profile: No results for input(s): CHOL, HDL, LDLCALC, TRIG, CHOLHDL, LDLDIRECT in the last 72 hours. Thyroid Function Tests: No results for input(s): TSH, T4TOTAL, FREET4, T3FREE, THYROIDAB in the last 72 hours. Anemia Panel: No results for input(s): VITAMINB12, FOLATE, FERRITIN, TIBC, IRON, RETICCTPCT in the last 72 hours. Sepsis Labs: Recent Labs  Lab 01/08/2020 1428 01/08/20 0303 01/09/20 0255  PROCALCITON  --  0.16 <0.10  LATICACIDVEN 1.6  --   --     Recent Results (from the past 240 hour(s))  Urine culture     Status: Abnormal   Collection Time: 12/16/2019  2:06 PM   Specimen: Urine, Clean Catch  Result Value Ref Range Status   Specimen Description   Final    URINE, CLEAN CATCH Performed at [facility], [place] [address], [place], [place] [address]    Special Requests   Final    NONE Performed at [facility], [place] [address] [place], [place] [address]    Culture MULTIPLE SPECIES PRESENT, SUGGEST RECOLLECTION (A)  Final   Report Status 01/07/2020 FINAL  Final  Blood culture (routine x  2)     Status: None (Preliminary result)  Collection Time: 12/23/2019  2:28 PM   Specimen: BLOOD  Result Value Ref Range Status   Specimen Description   Final    BLOOD BLOOD LEFT FOREARM Performed at [facility] [address] [place], [place] [address]    Special Requests   Final    BOTTLES DRAWN AEROBIC AND ANAEROBIC Blood Culture adequate volume Performed at [facility] [address] [place], [place] [address]    Culture   Final    NO GROWTH 3 DAYS Performed at [facility], [place] [address] [place], [place] [address]    Report Status PENDING  Incomplete  SARS Coronavirus 2 by RT PCR (hospital order, performed in [facility] hospital lab) Nasopharyngeal Nasopharyngeal Swab     Status: None   Collection Time: 12/28/2019  2:28 PM   Specimen: Nasopharyngeal Swab  Result Value Ref Range Status   SARS Coronavirus 2 NEGATIVE NEGATIVE Final    Comment: (NOTE) SARS-CoV-2 target nucleic acids are NOT DETECTED.  The SARS-CoV-2 RNA is generally detectable in upper and lower respiratory specimens during the acute phase of infection. The lowest concentration of SARS-CoV-2 viral copies this assay can detect is 250 copies / mL. A negative result does not preclude SARS-CoV-2 infection and should not be used as the sole basis for treatment or other patient management decisions.  A negative result may occur with improper specimen collection / handling, submission of specimen other than nasopharyngeal swab, presence of viral mutation(s) within the areas targeted by this assay, and inadequate number of viral copies (<250 copies / mL). A negative result must be combined with clinical observations, patient history, and epidemiological information.  Fact Sheet for Patients:   StrictlyIdeas.no  Fact Sheet for Healthcare Providers: BankingDealers.co.za  This test is not yet approved or  cleared by the  [place] FDA and has been authorized for detection and/or diagnosis of SARS-CoV-2 by FDA under an Emergency Use Authorization (EUA).  This EUA will remain in effect (meaning this test can be used) for the duration of the COVID-19 declaration under Section 564(b)(1) of the Act, 21 U.S.C. section 360bbb-3(b)(1), unless the authorization is terminated or revoked sooner.  Performed at [facility], [place] [address], [place], [place] [address]   Blood culture (routine x 2)     Status: None (Preliminary result)   Collection Time: 12/15/2019  2:29 PM   Specimen: BLOOD  Result Value Ref Range Status   Specimen Description   Final    BLOOD LEFT HAND Performed at [facility] [address], [place], [place] [address]    Special Requests   Final    BOTTLES DRAWN AEROBIC AND ANAEROBIC Blood Culture adequate volume Performed at [facility] [address], [place], [place] [address]    Culture   Final    NO GROWTH 3 DAYS Performed at [facility], [place] [address], [place],  [address]    Report Status PENDING  Incomplete         Radiology Studies: DG Chest 1 View  Result Date: 01/07/2020 CLINICAL DATA:  Shortness of breath. History of scoliosis, osteoporosis and poliomyelitis. Now with low-grade fever and cough EXAM: CHEST  1 VIEW COMPARISON:  01/01/2020 FINDINGS: Limited exam secondary to profound kyphoscoliosis deformity with secondary rotational artifact. There is diminished aeration to both lung bases compatible with atelectasis and airspace consolidation. IMPRESSION: 1. Limited exam. Diminished aeration to both lower lung zones compatible with atelectasis and  or pneumonia. Electronically Signed   By: Kerby Moors M.D.   On: 01/07/2020 11:45   CT ANGIO CHEST PE W OR WO CONTRAST  Result Date: 01/07/2020 CLINICAL DATA:  Shortness of breath, severe drop in oxygen saturation, high clinical probability of pulmonary  embolism EXAM: CT ANGIOGRAPHY CHEST WITH CONTRAST TECHNIQUE: Multidetector CT imaging of the chest was performed using the standard protocol during bolus administration of intravenous contrast. Multiplanar CT image reconstructions and MIPs were obtained to evaluate the vascular anatomy. CONTRAST:  144m OMNIPAQUE IOHEXOL 350 MG/ML SOLN IV COMPARISON:  06/29/2018 FINDINGS: Cardiovascular: Severe thoracic deformity secondary to scoliosis. Tortuous aorta. Aneurysmal dilatation ascending thoracic aorta 4.1 cm transverse image 51. No aortic dissection. Heart unremarkable. No pericardial effusion. Pulmonary arteries adequately opacified. Respiratory motion artifacts at the lower lobes. No definite pulmonary emboli identified. Mediastinum/Nodes: Base of cervical region unremarkable. No esophageal abnormalities. No thoracic adenopathy. Lungs/Pleura: Subtotal atelectasis of BILATERAL lower lobes. Patchy infiltrates LEFT upper lobe. Remaining lungs clear. No pleural effusion or pneumothorax. Upper Abdomen: Hepatic cyst LEFT lobe 2.4 x 2.3 cm unchanged. Remaining visualized upper abdomen unremarkable Musculoskeletal: Marked dextroconvex thoracic scoliosis. Osseous demineralization. Severe thoracic deformity. No acute bone lesions. Review of the MIP images confirms the above findings. IMPRESSION: No definite evidence of pulmonary embolism, with assessment of the lower lobes limited by respiratory motion. Aneurysmal dilatation of the ascending thoracic aorta 4.1 cm transverse; Recommend annual imaging followup by CTA or MRA. This recommendation follows 2010 ACCF/AHA/AATS/ACR/ASA/SCA/SCAI/SIR/STS/SVM Guidelines for the Diagnosis and Management of Patients with Thoracic Aortic Disease. Circulation. 2010; 121:: P809-X833 Aortic aneurysm NOS (ICD10-I71.9) Subtotal atelectasis of BILATERAL lower lobes with patchy infiltrates LEFT upper lobe question pneumonia. Severe thoracic deformity secondary to scoliosis. Aortic Atherosclerosis  (ICD10-I70.0). Electronically Signed   By: MLavonia DanaM.D.   On: 01/07/2020 12:17        Scheduled Meds: . albuterol  2.5 mg Nebulization TID  . buPROPion  150 mg Oral Daily  . chlorhexidine  15 mL Mouth Rinse BID  . Chlorhexidine Gluconate Cloth  6 each Topical Daily  . enoxaparin (LOVENOX) injection  40 mg Subcutaneous Q24H  . guaiFENesin-dextromethorphan  10 mL Oral Q6H  . LORazepam  1 mg Oral TID  . mouth rinse  15 mL Mouth Rinse q12n4p  . multivitamin with minerals  1 tablet Oral Daily  . potassium chloride  40 mEq Oral Once  . senna  1 tablet Oral BID  . sertraline  50 mg Oral Daily  . sodium chloride flush  3 mL Intravenous Q12H   Continuous Infusions: . azithromycin 500 mg (01/08/20 1756)  . cefTRIAXone (ROCEPHIN)  IV Stopped (01/08/20 1743)     LOS: 3 days    Time spent: 35 mins.More than 50% of that time was spent in counseling and/or coordination of care.      AShelly Coss MD [facility] P8/31/2021, 7:31 AM

## 2020-01-09 NOTE — Progress Notes (Signed)
   01/09/20 1000  Clinical Encounter Type  Visited With Patient;Patient not available  Visit Type Initial;Spiritual support;Psychological support;Critical Care  Referral From Family  Consult/Referral To Chaplain  Spiritual Encounters  Spiritual Needs Emotional;Other (Comment) (Spiritual Care Support/Prayer Heart )  Stress Factors  Patient Stress Factors Not reviewed   I attempted to visit with Gwyndolyn Saxon per referral from a family friend who provides [place] with out prayer hearts. She requested that I take him one. I took him a prayer heart, but Jareth was asleep. So I left it at his bedside.  Please, contact Spiritual Care for further assistance.   [place] M.Div. [place]

## 2020-01-09 NOTE — Evaluation (Signed)
SLP Cancellation Note  Patient Details Name: Manuel Reeves MRN: [id number] DOB: 07/03/1932   Cancelled treatment:       Reason Eval/Treat Not Completed: Other (comment);Medical issues which prohibited therapy   Kathleen Lime, MS [facility] SLP Acute Rehab Services Office [phone]  Macario Golds 01/09/2020, 2:59 PM

## 2020-01-09 NOTE — Progress Notes (Signed)
Pharmacy Antibiotic Note  Manuel Reeves is a 86 y.o. male with hx polio syndrome with chronic weakness in his legs and diaphragm and was recently diagnosed with PNA, presented to the ED on 12/20/2019 with worsening of SOB and AMS.  Chest CTA on 8/29 was negative for PE, but had findings with concern for PNA.  He was started on azithromycin and ceftriaxone on admission.  WBC increased to 32.6 on 8/31. Pharmacy is consulted to change abx to vancomycin and cefepime for broad coverage.   Plan: - cefepime 2 gm IV q8h - vancomycin 1750 mg IV x1, then 750 mg IV q12h for goal trough 15-20 _____________________________________   Height: 6' (182.9 cm) Weight: 83.5 kg (184 lb 1.4 oz) IBW/kg (Calculated) : 77.6  Temp (24hrs), Avg:97.5 F (36.4 C), Min:97.1 F (36.2 C), Max:98.1 F (36.7 C)  Recent Labs  Lab 12/10/2019 1428 01/06/20 0719 01/07/20 0631 01/08/20 0303 01/09/20 0255  WBC 19.4* 20.9* 22.8* 23.0* 32.6*  CREATININE 0.59* 0.55*  --  0.43* 0.58*  LATICACIDVEN 1.6  --   --   --   --     Estimated Creatinine Clearance: 74.1 mL/min (A) (by C-G formula based on SCr of 0.58 mg/dL (L)).    Allergies  Allergen Reactions  . Penicillins Rash    Has patient had a PCN reaction causing immediate rash, facial/tongue/throat swelling, SOB or lightheadedness with hypotension: Y Has patient had a PCN reaction causing severe rash involving mucus membranes or skin necrosis: N Has patient had a PCN reaction that required hospitalization: N Has patient had a PCN reaction occurring within the last 10 years: N If all of the above answers are "NO", then may proceed with Cephalosporin use.       Thank you for allowing pharmacy to be a part of this patient's care.  Lynelle Doctor 01/09/2020 10:53 AM

## 2020-01-09 NOTE — Evaluation (Signed)
SLP Cancellation Note  Patient Details Name: Manuel Reeves MRN: [id number] DOB: June 13, 1932   Cancelled treatment:       Reason Eval/Treat Not Completed: Other (comment) (pt remains on BiPap at this time, will continue efforts; pt has a h/o dysphagia likely due to his post-polio)  Kathleen Lime, MS [place] Office [phone]  Macario Golds 01/09/2020, 7:17 AM

## 2020-01-10 DIAGNOSIS — Z7189 Other specified counseling: Secondary | ICD-10-CM

## 2020-01-10 DIAGNOSIS — Z515 Encounter for palliative care: Secondary | ICD-10-CM

## 2020-01-10 DIAGNOSIS — J1100 Influenza due to unidentified influenza virus with unspecified type of pneumonia: Secondary | ICD-10-CM

## 2020-01-10 LAB — BASIC METABOLIC PANEL
Anion gap: 13 (ref 5–15)
BUN: 30 mg/dL — ABNORMAL HIGH (ref 8–23)
CO2: 25 mmol/L (ref 22–32)
Calcium: 9.7 mg/dL (ref 8.9–10.3)
Chloride: 110 mmol/L (ref 98–111)
Creatinine, Ser: 0.77 mg/dL (ref 0.61–1.24)
GFR calc Af Amer: >60 mL/min (ref >60)
GFR calc non Af Amer: >60 mL/min (ref >60)
Glucose, Bld: 111 mg/dL — ABNORMAL HIGH (ref 70–99)
Potassium: 4.1 mmol/L (ref 3.5–5.1)
Sodium: 148 mmol/L — ABNORMAL HIGH (ref 135–145)

## 2020-01-10 LAB — CBC WITH DIFFERENTIAL/PLATELET
Abs Immature Granulocytes: 0.76 10*3/uL — ABNORMAL HIGH (ref 0.00–0.07)
Basophils Absolute: 0 10*3/uL (ref 0.0–0.1)
Basophils Relative: 0 %
Eosinophils Absolute: 0 10*3/uL (ref 0.0–0.5)
Eosinophils Relative: 0 %
HCT: 43.2 % (ref 39.0–52.0)
Hemoglobin: 13 g/dL (ref 13.0–17.0)
Immature Granulocytes: 2 %
Lymphocytes Relative: 56 %
Lymphs Abs: 18.3 10*3/uL — ABNORMAL HIGH (ref 0.7–4.0)
MCH: 31.2 pg (ref 26.0–34.0)
MCHC: 30.1 g/dL (ref 30.0–36.0)
MCV: 103.6 fL — ABNORMAL HIGH (ref 80.0–100.0)
Monocytes Absolute: 0.8 10*3/uL (ref 0.1–1.0)
Monocytes Relative: 2 %
Neutro Abs: 13.2 10*3/uL — ABNORMAL HIGH (ref 1.7–7.7)
Neutrophils Relative %: 40 %
Platelets: 397 10*3/uL (ref 150–400)
RBC: 4.17 MIL/uL — ABNORMAL LOW (ref 4.22–5.81)
RDW: 14.4 % (ref 11.5–15.5)
WBC: 33 10*3/uL — ABNORMAL HIGH (ref 4.0–10.5)
nRBC: 0 % (ref 0.0–0.2)

## 2020-01-10 LAB — CULTURE, BLOOD (ROUTINE X 2)
Culture: NO GROWTH
Culture: NO GROWTH
Special Requests: ADEQUATE
Special Requests: ADEQUATE

## 2020-01-10 LAB — PROCALCITONIN: Procalcitonin: 0.12 ng/mL

## 2020-01-10 MED ORDER — BISACODYL 10 MG RE SUPP: 10.0000 mg | Freq: Every day | RECTAL | Status: DC | PRN

## 2020-01-10 MED ORDER — LORAZEPAM 2 MG/ML IJ SOLN: 1.0000 mg | INTRAMUSCULAR | Status: DC | PRN

## 2020-01-10 MED ORDER — GLYCOPYRROLATE 0.2 MG/ML IJ SOLN: 0.4000 mg | INTRAMUSCULAR | Status: DC | PRN

## 2020-01-10 MED ORDER — MORPHINE SULFATE (PF) 2 MG/ML IV SOLN
1.0000 mg | INTRAVENOUS | Status: AC | PRN
  Administered 2020-01-10: 1 mg via INTRAVENOUS
  Filled 2020-01-10 (×2): qty 1

## 2020-01-10 MED ORDER — POLYVINYL ALCOHOL 1.4 % OP SOLN
1.0000 [drp] | Freq: Four times a day (QID) | OPHTHALMIC | Status: DC | PRN
  Filled 2020-01-10: qty 15

## 2020-01-10 MED ORDER — LORAZEPAM 2 MG/ML IJ SOLN: 0.5000 mg | Freq: Three times a day (TID) | INTRAMUSCULAR | Status: DC

## 2020-01-10 MED ORDER — MORPHINE SULFATE (PF) 2 MG/ML IV SOLN
1.0000 mg | INTRAVENOUS | Status: AC | PRN
  Administered 2020-01-11: 2 mg via INTRAVENOUS
  Filled 2020-01-10: qty 1

## 2020-01-10 MED ORDER — GLYCOPYRROLATE 0.2 MG/ML IJ SOLN: 0.1000 mg | Freq: Once | INTRAMUSCULAR | Status: DC

## 2020-01-10 MED ORDER — LORAZEPAM 2 MG/ML IJ SOLN
0.5000 mg | Freq: Three times a day (TID) | INTRAMUSCULAR | Status: DC
  Administered 2020-01-10 – 2020-01-11 (×3): 1 mg via INTRAVENOUS
  Filled 2020-01-10 (×3): qty 1

## 2020-01-10 MED ORDER — BIOTENE DRY MOUTH MT LIQD: 15.0000 mL | OROMUCOSAL | Status: DC | PRN

## 2020-01-10 NOTE — Progress Notes (Signed)
PROGRESS NOTE  Manuel Reeves  BSW:[id number] DOB: February 09, 1933 DOA: 12/30/2019 PCP: Crist Infante, MD   Brief Narrative: Manuel Reeves is an 86 y.o. male with history of poliomyelitis, scoliosis, osteoporosis, CLL, anxiety who presented from independent living facility presented with low-grade fever, cough.  He has been bedbound at baseline since last several years.  He has home health aide to take care of him for most hours of the day, uses Contractor wheelchair and a trilogy device at night.  As per the wife, he had progressive decline in his physical strength in last several months. He has developed bowel incontinence since he had TURP. He was also found to be confused by his wife since last few days.  On presentation, he had mild fever, was in sinus tachycardia with respiratory rate of 20-30 and requiring 2 L of oxygen.  He had leukocytosis.  Urinalysis was not impressive of UTI.  Chest x-ray showed layering bilateral effusions with bilateral atelectasis or infiltrates right greater than left.  Patient was admitted for management of possible community-acquired pneumonia. Patient went into acute respiratory distress on the morning of 01/07/20 and had to be transferred to stepdown, put on BiPAP.  PCCM consulted and following. Unable to wean him off bipap and requiring 100% FiO2. With progressive respiratory failure, PCCM recommended palliative care discussions. With ongoing conversations and continued clinical decline, the patient's family has confirmed the desire to progress toward full comfort measures once full family visitation has been completed.   Assessment & Plan: Active Problems:   Pneumonia   Palliative care by specialist  Acute on chronic hypoxic respiratory failure: On trilogy at home due to restrictive lung disease, post-polio syndrome with diaphragmatic palsy and scoliosis. Patient went into acute respiratory distress in the morning of 01/07/20 and had to be transferred  to stepdown, currently on BiPAP.  ABG showed hypercarbia.  CT angiogram did not show any PE but showed atelectasis of BILATERAL lower lobes with patchy infiltrates LEFT upper lobe.   - PCCM consulted: His acute respiratory failure is multifactorial from pneumonia, sequelae of poliomyelitis and scoliosis. Prognosis is very poor due to preexisting lung disease.    Sepsis due to CAP: Met SIRS criteria with tachycardia, leukocytosis but no evidence of organ dysfunction.  Has leukocytosis and it is worsening.  Mildly elevated procalcitonin.   - Antibiotics upgraded to vancomycin and cefepime.   - Follow-up blood cultures. NGTD.  - IV fluids have been discontinued.  Community-acquired pneumonia: Presented with low-grade fever, wet cough, confusion.  Chest x-ray on admission showed layering bilateral effusions with bilateral atelectasis or infiltrates right greater than left. Was on  azithromycin and Rocephin but now changed to vancomycin and cefepime because of worsening respiratory status..  Continue mucolytics for cough.  Incentive spirometry, bronchodilators as needed.  As per the family, he has history of chronic dysphagia but patient denies. Speech therapy is also following. CT and chest x-ray done on 01/07/20 showed possibility of bilateral lower lungs pneumonia. Chest x-ray done today showed bibasilar atelectasis/infiltrates and small pleural Effusions.  Elevated D-dimer: Mild.  Low suspicion for thromboembolic disease.  CT angiogram did not show any PE.  Anxiety: Wife reported episodes of restlessness, agitation. - Continue home Zoloft, bupropion and Ativan as able  History of poliomyelitis: Diagnosed with polio when he was 20. 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  Debility/deconditioning/generalised  weakness: History of poliomyelitis with bedbound status.  Uses wheelchair  for ambulation.  Continue supportive care.    Goals of care: Transition to comfort measures per discussions with palliative care colleagues. Low threshold to start morphine gtt.  DVT prophylaxis: Lovenox Code Status: DNR Family Communication: None at bedside this AM. Family meeting 3pm Disposition Plan:  Status is: Inpatient  Remains inpatient appropriate because:Inpatient level of care appropriate due to severity of illness   Dispo: The patient is from: Home              Anticipated d/c is to: Anticipate inability to transfer to residential hospice and therefore, inpatient demise              Anticipated d/c date is: 1 day              Patient currently is not medically stable to d/c.  Consultants:   PCCM  Palliative care  Procedures:   BiPAP  Antimicrobials:  Ceftriaxone, azithromycin   Subjective: Unable to meaningfully respond, has been on BiPAP for >36 hours.   Objective: Vitals:   01/10/20 1400 01/10/20 1447 01/10/20 1500 01/10/20 1525  BP: (!) 157/59  (!) 170/58 (!) 149/55  Pulse: (!) 107  (!) 121 (!) 123  Resp: (!) 23  (!) 32 (!) 28  Temp:      TempSrc:      SpO2: 100% (!) 88% (!) 87% 95%  Weight:      Height:        Intake/Output Summary (Last 24 hours) at 01/10/2020 1955 Last data filed at 01/10/2020 0900 Gross per 24 hour  Intake 439.39 ml  Output 100 ml  Net 339.39 ml   Filed Weights   12/23/2019 1840 01/10/20 0406  Weight: 83.5 kg 85.9 kg    Gen: Elderly, ill-appearing male in no distress Pulm: Coarse, on BiPAP  CV: Regular tachycardia. No murmur, rub, or gallop. No JVD, no pitting pedal edema. GI: Abdomen soft, non-tender, non-distended, with normoactive bowel sounds. No organomegaly or masses felt. Ext: Warm, no deformities Skin: No rashes, lesions or ulcers Neuro: Does not open eyes to voice, does not follow commands.  Psych: UTD  Data Reviewed: I have personally reviewed following labs and imaging studies  CBC: Recent Labs  Lab  01/06/20 0719 01/06/20 0719 01/07/20 0631 01/07/20 1104 01/08/20 0303 01/09/20 0255 01/10/20 0248  WBC 20.9*  --  22.8*  --  23.0* 32.6* 33.0*  NEUTROABS 9.9*  --  10.8*  --  9.5* 13.0* 13.2*  HGB 13.5   < > 13.1 14.6 12.7* 13.6 13.0  HCT 41.9   < > 41.3 43.0 40.9 43.8 43.2  MCV 97.7  --  99.0  --  101.2* 100.9* 103.6*  PLT 333  --  329  --  317 391 397   < > = values in this interval not displayed.   Basic Metabolic Panel: Recent Labs  Lab 12/14/2019 1428 01/04/2020 1428 01/06/20 0719 01/07/20 1104 01/08/20 0303 01/09/20 0255 01/10/20 0248  NA 138   < > 141 142 142 144 148*  K 3.7   < > 3.5 3.4* 3.8 4.1 4.1  CL 100  --  104  --  107 107 110  CO2 28  --  25  --  25 21* 25  GLUCOSE 116*  --  104*  --  113* 117* 111*  BUN 21  --  19  --  17 22 30*  CREATININE 0.59*  --  0.55*  --  0.43* 0.58* 0.77  CALCIUM 8.6*  --  8.8*  --  8.9 9.3 9.7  MG  --   --  1.9  --   --   --   --    < > = values in this interval not displayed.   GFR: Estimated Creatinine Clearance: 74.1 mL/min (by C-G formula based on SCr of 0.77 mg/dL). Liver Function Tests: Recent Labs  Lab 12/20/2019 1428  AST 28  ALT 20  ALKPHOS 112  BILITOT 0.4  PROT 6.7  ALBUMIN 3.3*   No results for input(s): LIPASE, AMYLASE in the last 168 hours. No results for input(s): AMMONIA in the last 168 hours. Coagulation Profile: No results for input(s): INR, PROTIME in the last 168 hours. Cardiac Enzymes: No results for input(s): CKTOTAL, CKMB, CKMBINDEX, TROPONINI in the last 168 hours. BNP (last 3 results) No results for input(s): PROBNP in the last 8760 hours. HbA1C: No results for input(s): HGBA1C in the last 72 hours. CBG: Recent Labs  Lab 01/09/20 1945  GLUCAP 113*   Lipid Profile: No results for input(s): CHOL, HDL, LDLCALC, TRIG, CHOLHDL, LDLDIRECT in the last 72 hours. Thyroid Function Tests: No results for input(s): TSH, T4TOTAL, FREET4, T3FREE, THYROIDAB in the last 72 hours. Anemia Panel: No  results for input(s): VITAMINB12, FOLATE, FERRITIN, TIBC, IRON, RETICCTPCT in the last 72 hours. Urine analysis:    Component Value Date/Time   COLORURINE YELLOW 12/29/2019 1406   APPEARANCEUR HAZY (A) 12/10/2019 1406   LABSPEC 1.030 12/28/2019 1406   PHURINE 5.0 01/03/2020 1406   GLUCOSEU NEGATIVE 01/04/2020 1406   HGBUR NEGATIVE 01/07/2020 1406   BILIRUBINUR NEGATIVE 12/18/2019 1406   KETONESUR 5 (A) 01/06/2020 1406   PROTEINUR 100 (A) 12/13/2019 1406   NITRITE NEGATIVE 12/26/2019 1406   LEUKOCYTESUR LARGE (A) 12/23/2019 1406   Recent Results (from the past 240 hour(s))  Urine culture     Status: Abnormal   Collection Time: 12/12/2019  2:06 PM   Specimen: Urine, Clean Catch  Result Value Ref Range Status   Specimen Description   Final    URINE, CLEAN CATCH Performed at [facility], [place] [address], [place], [place] [address]    Special Requests   Final    NONE Performed at [facility], [place] [address] [place], [place] [address]    Culture MULTIPLE SPECIES PRESENT, SUGGEST RECOLLECTION (A)  Final   Report Status 01/07/2020 FINAL  Final  Blood culture (routine x 2)     Status: None   Collection Time: 12/19/2019  2:28 PM   Specimen: BLOOD  Result Value Ref Range Status   Specimen Description   Final    BLOOD BLOOD LEFT FOREARM Performed at [facility] [address], [place], [place] [address]    Special Requests   Final    BOTTLES DRAWN AEROBIC AND ANAEROBIC Blood Culture adequate volume Performed at [facility] [address], [place], [place] [address]    Culture   Final    NO GROWTH 5 DAYS Performed at [facility], [place] [address] [place],  [address]    Report Status 01/10/2020 FINAL  Final  SARS Coronavirus 2 by RT PCR (hospital order, performed in [facility] hospital lab) Nasopharyngeal Nasopharyngeal Swab     Status: None   Collection Time: 01/06/2020  2:28 PM   Specimen:  Nasopharyngeal Swab  Result Value Ref Range Status   SARS Coronavirus 2 NEGATIVE NEGATIVE Final  Comment: (NOTE) SARS-CoV-2 target nucleic acids are NOT DETECTED.  The SARS-CoV-2 RNA is generally detectable in upper and lower respiratory specimens during the acute phase of infection. The lowest concentration of SARS-CoV-2 viral copies this assay can detect is 250 copies / mL. A negative result does not preclude SARS-CoV-2 infection and should not be used as the sole basis for treatment or other patient management decisions.  A negative result may occur with improper specimen collection / handling, submission of specimen other than nasopharyngeal swab, presence of viral mutation(s) within the areas targeted by this assay, and inadequate number of viral copies (<250 copies / mL). A negative result must be combined with clinical observations, patient history, and epidemiological information.  Fact Sheet for Patients:   StrictlyIdeas.no  Fact Sheet for Healthcare Providers: BankingDealers.co.za  This test is not yet approved or  cleared by the [place] FDA and has been authorized for detection and/or diagnosis of SARS-CoV-2 by FDA under an Emergency Use Authorization (EUA).  This EUA will remain in effect (meaning this test can be used) for the duration of the COVID-19 declaration under Section 564(b)(1) of the Act, 21 U.S.C. section 360bbb-3(b)(1), unless the authorization is terminated or revoked sooner.  Performed at [facility], [place] [address] [place], [place] [address]   Blood culture (routine x 2)     Status: None   Collection Time: 01/01/2020  2:29 PM   Specimen: BLOOD  Result Value Ref Range Status   Specimen Description   Final    BLOOD LEFT HAND Performed at [facility] [address], [place], [place] [address]    Special Requests   Final    BOTTLES DRAWN AEROBIC AND  ANAEROBIC Blood Culture adequate volume Performed at [facility] [address] [place], [place] [address]    Culture   Final    NO GROWTH 5 DAYS Performed at [facility], [place] [address] [place], [place] [address]    Report Status 01/10/2020 FINAL  Final      Radiology Studies: DG CHEST PORT 1 VIEW  Result Date: 01/09/2020 CLINICAL DATA:  Low-grade fever.  Cough. EXAM: PORTABLE CHEST 1 VIEW COMPARISON:  Chest x-ray 01/29/2020. FINDINGS: Deformity of the chest and severe scoliosis of the thoracic spine again noted and again limiting evaluation. Heart size normal. Bibasilar infiltrates and small bilateral pleural effusions again noted. Similar findings on prior exams. No pneumothorax. IMPRESSION: Limited exam. Bibasilar atelectasis/infiltrates and small pleural effusions again noted. Similar findings noted on prior exam. Electronically Signed   By: [place]   On: 01/09/2020 08:28    Scheduled Meds:  chlorhexidine  15 mL Mouth Rinse BID   Chlorhexidine Gluconate Cloth  6 each Topical Daily   LORazepam  0.5-1 mg Intravenous Q8H   mouth rinse  15 mL Mouth Rinse q12n4p   sodium chloride flush  3 mL Intravenous Q12H   Continuous Infusions:  ceFEPime (MAXIPIME) IV Stopped (01/10/20 1202)   vancomycin Stopped (01/10/20 1341)     LOS: 4 days   Time spent: 35 minutes.  Patrecia Pour, MD [facility] [email or web address] 01/10/2020, 7:55 PM

## 2020-01-10 NOTE — Progress Notes (Signed)
Palliative care paged to set up family meeting time for goals of care. Family able to meet at 3pm today. [facility] and palliative care provider both notified of time.

## 2020-01-10 NOTE — Progress Notes (Signed)
   NAME:  Manuel Reeves, MRN:  [id number], DOB:  1932-12-01, LOS: 4 ADMISSION DATE:  01/04/2020, CONSULTATION DATE: 01/08/2020 REFERRING MD: Dr. Tawanna Solo, CHIEF COMPLAINT: Hypoxemic respiratory failure  Brief History   Asked to see patient for hypoxemic respiratory failure   Past Medical History   Past Medical History:  Diagnosis Date  . Anxiety   . CLL (chronic lymphocytic leukemia) ([facility]) 02/20/2014  . Osteoporosis   . Pneumonia   . Polio   . Scoliosis      Significant Hospital Events   On BiPAP  Consults:  pccm  Procedures:  None  Significant Diagnostic Tests:  ABG-7.28 5/57/93 CT scan of the chest performed 8/29 Reviewed by myself showing bibasilar atelectasis  Micro Data:  Blood culture 8/27-no organism so far Urine culture 8/27-no organism  Antimicrobials:  Azithromycin 8/27>>8/31 Ceftriaxone 8/27>>8/31  Interim history/subjective:  Still requiring high levels of BiPAP support  Objective   Blood pressure (Abnormal) 144/95, pulse (Abnormal) 107, temperature 97.7 F (36.5 C), temperature source Axillary, resp. rate (Abnormal) 26, height 6' (1.829 m), weight 85.9 kg, SpO2 100 %.    FiO2 (%):  [50 %-80 %] 60 %   Intake/Output Summary (Last 24 hours) at 01/10/2020 0842 Last data filed at 01/10/2020 5284 Gross per 24 hour  Intake 339.39 ml  Output 500 ml  Net -160.61 ml   Filed Weights   01/07/2020 1840 01/10/20 0406  Weight: 83.5 kg 85.9 kg    Examination:  General this 86 year old white male currently on noninvasive positive pressure ventilation he appears terminally ill HEENT normocephalic.  BiPAP mask in place.  Has intermittent air leak from mask seal Pulmonary currently on NIPPV 70% FiO2/PEEP 7/pressure support 14.  He has marked accessory use.  He is tachypneic.  Has scattered rhonchi, more diminished in the bases Cardiac regular rate and rhythm Abdomen soft nontender Extremities are warm and dry Neuro agitated at times currently appears  comfortable  Resolved Hospital Problem list     Assessment & Plan:   Acute hypoxemic/hypercapnic respiratory failure in the setting of community acquired pneumonia superimposed on underlying postpolio syndrome with diaphragmatic palsy as well as restrictive lung disease from scoliosis -Clinically looks as though he is fatiguing -cough mechanics very poor Plan Day 6 abx Cont BIPAP as tolerated.  PRN morphine  Cont BDs PRN lasix  Nothing more I can offer -->would consider transition to comfort  Anxiety Plan  Cont home meds Agree w/ morphine for air hunger  Debility secondary to post polio syndrome -He is bedbound, wheelchair dependent Plan Supportive care  Patient is DNR Continue antibiotics Continue BiPAP for support  Best practice:  Diet: As tolerated Pain/Anxiety/Delirium protocol (if indicated): As needed VAP protocol (if indicated): Not indicated DVT prophylaxis: Lovenox Mobility: Bedrest Code Status: Full code  Family Communication: Defer to primary Disposition: Stepdown Critical care will sign off we have nothing else to add here I think she would benefit from transitioning to comfort.  Apparently family is prepared for this.  Palliative care has been consulted.  [place] Pager # [phone] OR # [phone] if no answer

## 2020-01-10 NOTE — Evaluation (Signed)
SLP Cancellation Note  Patient Details Name: Manuel Reeves MRN: [id number] DOB: 10-21-1932   Cancelled treatment:       Reason Eval/Treat Not Completed: Other (comment) (pt remains on bipap, note recommendations for comfort)   Manuel Reeves 01/10/2020, 12:49 PM   Manuel Lime, MS [facility] SLP [place] Office [phone]

## 2020-01-10 NOTE — Progress Notes (Addendum)
Around 04:15 pt became increasingly agitated and ripped off his BiPap mask. RN placed mask back on (done many times previously in the night) but O2 would not increase over 79%. Increased FiO2 to 100% and gave PRN ativan before patient was able to oxygenate at 95%. [place] provider paged and ordered PRN Morphine for air hunger, but not given. Patient resting comfortably at this time and RN will continue to monitor closely.

## 2020-01-10 NOTE — Consult Note (Signed)
Consultation Note Date: 01/10/2020   Patient Name: Manuel Reeves  DOB: 1932/10/06  MRN: [id number]  Age / Sex: 86 y.o., male  PCP: Crist Infante, MD Referring Physician: Patrecia Pour, MD  Reason for Consultation: Establishing goals of care  HPI/Patient Profile: 86 y.o. male  with past medical history of poliomyelitis, scoliosis, CLL, anxiety admitted from independent living facility on 12/17/2019 with low-grade fever and cough with more confusion related to community-acquired pneumonia with acute on chronic respiratory failure with underlying postpolio syndrome and diaphragmatic palsy and restrictive lung disease from scoliosis. He has had functional decline at home and over past few years has been bedbound and requires assistance with all ADLs with help from aides using Contractor wheelchair. Also uses trilogy at home. He has become BiPAP dependent and is not improving. Palliative care requested to assist with family discussions regarding consideration of transition to comfort.   Clinical Assessment and Goals of Care: I met today after receiving update from bedside RN and reviewing records. I met with wife, 2 sons, pastoral support person, and also 2 granddaughters via telephone. We discussed his general decline over the past several years but more significant decline just recently. Discussed chronic lung dysfunction as evidenced by need for trilogy in the setting of physical deconditioning and acute pneumonia. They understand that his lungs and his body are not strong enough to clear this infection. We discussed how to move forward. They confirm DNR. Wife Manuel Reeves expresses clearly that they want for him to be comfortable and not suffer. We discussed use of comfort medications and morphine for breathing relief. We discussed use of BiPAP and that this is very uncomfortable and becomes in itself a form of  life support. Ultimately we decided to make him comfortable beginning with low dose comfort medications and using BiPAP temporarily until more family can arrive (~3-4 hours away) to be present. Once family have arrived will pursue full comfort care and BiPAP to be discontinued.   He will need to have adequate premedication with morphine to d/c BiPAP comfortably. Low threshold to add morphine infusion as this may be needed to provide him relief from air hunger.   All questions/concerns addressed. Emotional support provided. Updated Dr. Bonner Puna and Manuel Griffon, NP PCCM. Discussed plans for liberalized visitation and comfort care with bedside RN.   Primary Decision Maker NEXT OF KIN wife with support of 2 adult children      SUMMARY OF RECOMMENDATIONS   - DNR - Comfort care - Utilize BiPAP until family can arrive to bedside (awaiting 2 granddaughters)  Code Status/Advance Care Planning:  DNR   Symptom Management:   SOB/pain: Morphine 1-4 mg IV every hour as needed for pain/shortness of breath. May liberalize as needed to achieve comfort. Low threshold for morphine infusion.   Anxiety: Takes ativan chronically at home. Will add scheduled IV ativan with prn doses for breakthrough anxiety.   Secretions: None currently. Robinul IV prn.   Palliative Prophylaxis:   Frequent Pain Assessment, Oral Care and  Turn Reposition  Additional Recommendations (Limitations, Scope, Preferences):  Full Comfort Care  Psycho-social/Spiritual:   Desire for further Chaplaincy support:yes  Additional Recommendations: Grief/Bereavement Support  Prognosis:   Hours - Days  Discharge Planning: Anticipated Hospital Death      Primary Diagnoses: Present on Admission: . Pneumonia   I have reviewed the medical record, interviewed the patient and family, and examined the patient. The following aspects are pertinent.  Past Medical History:  Diagnosis Date  . Anxiety   . CLL (chronic lymphocytic  leukemia) ([place]) 02/20/2014  . Osteoporosis   . Pneumonia   . Polio   . Scoliosis    Social History   Socioeconomic History  . Marital status: Married    Spouse name: Not on file  . Number of children: Not on file  . Years of education: Not on file  . Highest education level: Not on file  Occupational History  . Not on file  Tobacco Use  . Smoking status: Former Research scientist (life sciences)  . Smokeless tobacco: Never Used  . Tobacco comment: Quit at age 56  Vaping Use  . Vaping Use: Never used  Substance and Sexual Activity  . Alcohol use: Yes    Comment: occasional beer or wine manybe monthly  . Drug use: No  . Sexual activity: Not Currently  Other Topics Concern  . Not on file  Social History Narrative   .    Social Determinants of Health   Financial Resource Strain:   . Difficulty of Paying Living Expenses: Not on file  Food Insecurity:   . Worried About Charity fundraiser in the Last Year: Not on file  . Ran Out of Food in the Last Year: Not on file  Transportation Needs:   . Lack of Transportation (Medical): Not on file  . Lack of Transportation (Non-Medical): Not on file  Physical Activity:   . Days of Exercise per Week: Not on file  . Minutes of Exercise per Session: Not on file  Stress:   . Feeling of Stress : Not on file  Social Connections:   . Frequency of Communication with Friends and Family: Not on file  . Frequency of Social Gatherings with Friends and Family: Not on file  . Attends Religious Services: Not on file  . Active Member of Clubs or Organizations: Not on file  . Attends Archivist Meetings: Not on file  . Marital Status: Not on file   Family History  Problem Relation Age of Onset  . Colon cancer Sister 52   Scheduled Meds: . albuterol  2.5 mg Nebulization TID  . buPROPion  150 mg Oral Daily  . chlorhexidine  15 mL Mouth Rinse BID  . Chlorhexidine Gluconate Cloth  6 each Topical Daily  . enoxaparin (LOVENOX) injection  40 mg Subcutaneous  Q24H  . guaiFENesin-dextromethorphan  10 mL Oral Q6H  . LORazepam  1 mg Oral TID  . mouth rinse  15 mL Mouth Rinse q12n4p  . multivitamin with minerals  1 tablet Oral Daily  . potassium chloride  40 mEq Oral Once  . senna  1 tablet Oral BID  . sertraline  50 mg Oral Daily  . sodium chloride flush  3 mL Intravenous Q12H   Continuous Infusions: . ceFEPime (MAXIPIME) IV Stopped (01/10/20 1202)  . vancomycin 750 mg (01/10/20 1241)   PRN Meds:.acetaminophen **OR** acetaminophen, hydrALAZINE, ipratropium-albuterol, LORazepam, magnesium hydroxide, morphine injection, ondansetron **OR** ondansetron (ZOFRAN) IV, sorbitol Allergies  Allergen Reactions  . Penicillins Rash  Has patient had a PCN reaction causing immediate rash, facial/tongue/throat swelling, SOB or lightheadedness with hypotension: Y Has patient had a PCN reaction causing severe rash involving mucus membranes or skin necrosis: N Has patient had a PCN reaction that required hospitalization: N Has patient had a PCN reaction occurring within the last 10 years: N If all of the above answers are "NO", then may proceed with Cephalosporin use.      Review of Systems  Unable to perform ROS: Acuity of condition    Physical Exam Vitals and nursing note reviewed.  Constitutional:      General: He is in acute distress.     Appearance: He is ill-appearing and diaphoretic.  Cardiovascular:     Rate and Rhythm: Tachycardia present.  Pulmonary:     Effort: Tachypnea, accessory muscle usage and respiratory distress present.  Abdominal:     General: Abdomen is flat.     Palpations: Abdomen is soft.  Skin:    General: Skin is warm.  Neurological:     Mental Status: He is alert.     Vital Signs: BP 107/86   Pulse (!) 105   Temp (!) 96.7 F (35.9 C) (Axillary) Comment: warm blankets applied  Resp (!) 22   Ht 6' (1.829 m)   Wt 85.9 kg   SpO2 100%   BMI 25.68 kg/m  Pain Scale: PAINAD   Pain Score: 0-No pain   SpO2:  SpO2: 100 % O2 Device:SpO2: 100 % O2 Flow Rate: .O2 Flow Rate (L/min): 10 L/min  IO: Intake/output summary:   Intake/Output Summary (Last 24 hours) at 01/10/2020 1344 Last data filed at 01/10/2020 0900 Gross per 24 hour  Intake 439.39 ml  Output 500 ml  Net -60.61 ml    LBM: Last BM Date: 12/23/2019 Baseline Weight: Weight: 83.5 kg Most recent weight: Weight: 85.9 kg     Palliative Assessment/Data:     Time In: 1500 Time Out: 1610 Time Total: 70 min Greater than 50%  of this time was spent counseling and coordinating care related to the above assessment and plan.  Signed by: Vinie Sill, NP Palliative Medicine Team Pager # [phone] (M-F 8a-5p) Team Phone # [phone] (Nights/Weekends)

## 2020-01-10 NOTE — TOC Progression Note (Signed)
Transition of Care [facility]) - Progression Note    Patient Details  Name: Manuel Reeves MRN: [id number] Date of Birth: [birth year]  [facility] CM/SW Contact  [name], RN Phone Number: 01/10/2020, 9:38 AM  Clinical Narrative:    Continues on the bipap had episode of air hunger during last night, started on iv ms drip and became comfortable. Plan is to return to [facility].  Pt is bed bound and get around in a wheelchair due to polio.        Expected Discharge Plan and Services                                                 Social Determinants of Health (SDOH) Interventions    Readmission Risk Interventions No flowsheet data found.

## 2020-01-10 NOTE — Progress Notes (Signed)
OT Cancellation Note  Patient Details Name: Manuel Reeves MRN: [id number] DOB: 1932/07/05   Cancelled Treatment:    Reason Eval/Treat Not Completed: Medical issues which prohibited therapy. Patient with ongoing medical issues, continued bipap. At baseline per chart review patient requiring extensive assistance for transfers/mobility. Will discontinue acute OT services at this time, please re-consult if new needs arise.  Delbert Phenix OT OT pager: [place] 01/10/2020, 6:43 AM

## 2020-01-10 DEATH — deceased

## 2020-01-11 DIAGNOSIS — Z9989 Dependence on other enabling machines and devices: Secondary | ICD-10-CM

## 2020-01-11 MED ORDER — MORPHINE BOLUS VIA INFUSION
1.0000 mg | INTRAVENOUS | Status: DC | PRN
  Administered 2020-01-11: 1 mg via INTRAVENOUS
  Filled 2020-01-11: qty 2

## 2020-01-11 MED ORDER — LORAZEPAM 2 MG/ML IJ SOLN: 1.0000 mg | INTRAMUSCULAR | Status: DC | PRN

## 2020-01-11 MED ORDER — MORPHINE 100MG IN NS 100ML (1MG/ML) PREMIX INFUSION
1.0000 mg/h | INTRAVENOUS | Status: DC
  Administered 2020-01-11: 1 mg/h via INTRAVENOUS
  Filled 2020-01-11: qty 100

## 2020-02-09 NOTE — Progress Notes (Signed)
Palliative:  HPI: 86 y.o. male  with past medical history of poliomyelitis, scoliosis, CLL, anxiety admitted from independent living facility on 12/18/2019 with low-grade fever and cough with more confusion related to community-acquired pneumonia with acute on chronic respiratory failure with underlying postpolio syndrome and diaphragmatic palsy and restrictive lung disease from scoliosis. He has had functional decline at home and over past few years has been bedbound and requires assistance with all ADLs with help from aides using Contractor wheelchair. Also uses trilogy at home. He has become BiPAP dependent and is not improving. Palliative care requested to assist with family discussions regarding consideration of transition to comfort.    I met today with multiple family members at bedside. Mr. Olden appears comfortable on BiPAP. We discussed use of morphine infusion for management of breathing comfort in order to d/c BiPAP. They agree. We also discussed prognosis as likely only hours but potentially 1-2 days although I feel this is less likely. We discussed signs of discomfort vs expected signs of progression at end of life. I was present throughout process of removing BiPAP. He was placed on nasal cannula at family request. He appeared comfortably and without distress throughout this process. Breathing very shallow and oxygen declining. I updated family that he would likely not live much longer. I left them in the care of nursing but will be available as needed.   All questions/concerns addressed. Emotional support provided.   Exam: BiPAP removed and placed on nasal cannula. Agonal breathing. Hands cold to touch. No distress.   Plan: - Full comfort care.  - Anticipate hospital death shortly.   [place], NP Palliative Medicine Team Pager [phone] (Please see [email or web address] for schedule) Team Phone [phone]    Greater than 50%  of this time was spent  counseling and coordinating care related to the above assessment and plan

## 2020-02-09 NOTE — Death Summary Note (Signed)
Patient cardiac time of death occurred at 25. Patient was transitioned to comfort care this morning per palliative orders. Morphine gtt was initiated at 1mg /hr w/1mg  bolus, bipap removed/mouth care completed, patient placed on 2L [place] per family request.   Wife and patients 2 sons at bedside, granddaughters also present. All patient belongings taken by family. Funeral home information obtained.   CDS notified of time of death, potential for tissue donation.

## 2020-02-09 NOTE — Death Summary Note (Addendum)
DEATH SUMMARY   Patient Details  Name: Manuel Reeves MRN: [id number] DOB: 1933-04-17  Admission/Discharge Information   Admit Date:  01-29-2020  Date of Death: Date of Death: 2020-02-04  Time of Death: Time of Death: 1133/08/24  Length of Stay: 5  Referring Physician: Crist Infante, MD   Reason(s) for Hospitalization  Sepsis and acute respiratory failure  Diagnoses  Preliminary cause of death: Acute on chronic hypoxemic and hypercarbic respiratory failure Secondary Diagnoses (including complications and co-morbidities):  Active Problems:   Pneumonia   Palliative care by specialist   BiPAP (biphasic positive airway pressure) dependence Sepsis due to community acquired pneumonia Bilateral pleural effusions Post-polio syndrome Restrictive lung disease Encounter for palliative care Acidosis Anemia NOS DMII with complications History of tobacco abuse, former smoker Bacteriuria Hypernatremia Deconditioning Debility Generalized weakness Goals of care counseling CLL Osteoporosis  Brief Hospital Course (including significant findings, care, treatment, and services provided and events leading to death)  Manuel Reeves is an 86 y.o. male with history of poliomyelitis, scoliosis, osteoporosis, CLL, anxiety who presented from independent living facility presented with low-grade fever, cough. He has been bedbound at baseline since last several years. He has home health aide to take care of him for most hours of the day, uses Contractor wheelchair and a trilogy device at night. As per the wife, he had progressive decline in his physical strength in last several months. He has developed bowel incontinence since he had TURP. He was also found to be confused by his wife since last few days. On presentation, he had mild fever, was in sinus tachycardia with respiratory rate of 20-30 and requiring 2 L of oxygen. He had leukocytosis. Urinalysis was not impressive of UTI.  Chest x-ray showed layering bilateral effusions with bilateral atelectasis or infiltrates right greater than left. Patient was admitted for management of possible community-acquired pneumonia. Patient went into acute respiratory distress on the morning of 01/07/20 and had to be transferred to stepdown, put on BiPAP. PCCM consulted and following. Unable to wean him off bipap and requiring 100% FiO2. With progressive respiratory failure, PCCM recommended palliative care discussions. With ongoing conversations and continued clinical decline, the patient's family has confirmed the desire to progress toward full comfort measures once full family visitation has been completed. Morphine infusion was ordered and the patient passed shortly thereafter.  Pertinent Labs and Studies  Significant Diagnostic Studies DG Chest 1 View  Result Date: 01/07/2020 CLINICAL DATA:  Shortness of breath. History of scoliosis, osteoporosis and poliomyelitis. Now with low-grade fever and cough EXAM: CHEST  1 VIEW COMPARISON:  2020-01-29 FINDINGS: Limited exam secondary to profound kyphoscoliosis deformity with secondary rotational artifact. There is diminished aeration to both lung bases compatible with atelectasis and airspace consolidation. IMPRESSION: 1. Limited exam. Diminished aeration to both lower lung zones compatible with atelectasis and or pneumonia. Electronically Signed   By: Kerby Moors M.D.   On: 01/07/2020 11:45   CT ANGIO CHEST PE W OR WO CONTRAST  Result Date: 01/07/2020 CLINICAL DATA:  Shortness of breath, severe drop in oxygen saturation, high clinical probability of pulmonary embolism EXAM: CT ANGIOGRAPHY CHEST WITH CONTRAST TECHNIQUE: Multidetector CT imaging of the chest was performed using the standard protocol during bolus administration of intravenous contrast. Multiplanar CT image reconstructions and MIPs were obtained to evaluate the vascular anatomy. CONTRAST:  1102mL OMNIPAQUE IOHEXOL 350 MG/ML SOLN  IV COMPARISON:  06/29/2018 FINDINGS: Cardiovascular: Severe thoracic deformity secondary to scoliosis. Tortuous aorta. Aneurysmal dilatation ascending thoracic aorta  4.1 cm transverse image 51. No aortic dissection. Heart unremarkable. No pericardial effusion. Pulmonary arteries adequately opacified. Respiratory motion artifacts at the lower lobes. No definite pulmonary emboli identified. Mediastinum/Nodes: Base of cervical region unremarkable. No esophageal abnormalities. No thoracic adenopathy. Lungs/Pleura: Subtotal atelectasis of BILATERAL lower lobes. Patchy infiltrates LEFT upper lobe. Remaining lungs clear. No pleural effusion or pneumothorax. Upper Abdomen: Hepatic cyst LEFT lobe 2.4 x 2.3 cm unchanged. Remaining visualized upper abdomen unremarkable Musculoskeletal: Marked dextroconvex thoracic scoliosis. Osseous demineralization. Severe thoracic deformity. No acute bone lesions. Review of the MIP images confirms the above findings. IMPRESSION: No definite evidence of pulmonary embolism, with assessment of the lower lobes limited by respiratory motion. Aneurysmal dilatation of the ascending thoracic aorta 4.1 cm transverse; Recommend annual imaging followup by CTA or MRA. This recommendation follows 2010 ACCF/AHA/AATS/ACR/ASA/SCA/SCAI/SIR/STS/SVM Guidelines for the Diagnosis and Management of Patients with Thoracic Aortic Disease. Circulation. 2010; 121: L465-K354. Aortic aneurysm NOS (ICD10-I71.9) Subtotal atelectasis of BILATERAL lower lobes with patchy infiltrates LEFT upper lobe question pneumonia. Severe thoracic deformity secondary to scoliosis. Aortic Atherosclerosis (ICD10-I70.0). Electronically Signed   By: Lavonia Dana M.D.   On: 01/07/2020 12:17   DG CHEST PORT 1 VIEW  Result Date: 01/09/2020 CLINICAL DATA:  Low-grade fever.  Cough. EXAM: PORTABLE CHEST 1 VIEW COMPARISON:  Chest x-ray 01/29/2020. FINDINGS: Deformity of the chest and severe scoliosis of the thoracic spine again noted and again  limiting evaluation. Heart size normal. Bibasilar infiltrates and small bilateral pleural effusions again noted. Similar findings on prior exams. No pneumothorax. IMPRESSION: Limited exam. Bibasilar atelectasis/infiltrates and small pleural effusions again noted. Similar findings noted on prior exam. Electronically Signed   By: Marcello Moores  Register   On: 01/09/2020 08:28   DG Chest Portable 1 View  Result Date: 12/12/2019 CLINICAL DATA:  Cough, fever, shortness of breath, hypoxia EXAM: PORTABLE CHEST 1 VIEW COMPARISON:  06/30/2018 FINDINGS: Severe thoracic scoliosis limits the study. Heart is normal size. Bilateral lower lobe airspace opacities. Probable layering effusions. Aortic atherosclerosis. IMPRESSION: Layering bilateral effusions with bibasilar atelectasis or infiltrates, right greater than left. Cannot exclude pneumonia. Electronically Signed   By: Rolm Baptise M.D.   On: 12/23/2019 15:12    Microbiology Recent Results (from the past 240 hour(s))  Urine culture     Status: Abnormal   Collection Time: 12/29/2019  2:06 PM   Specimen: Urine, Clean Catch  Result Value Ref Range Status   Specimen Description   Final    URINE, CLEAN CATCH Performed at [facility], [place] [address] [place], [place] [address]    Special Requests   Final    NONE Performed at [facility], [place] [address] [place], [place] [address]    Culture MULTIPLE SPECIES PRESENT, SUGGEST RECOLLECTION (A)  Final   Report Status 01/07/2020 FINAL  Final  Blood culture (routine x 2)     Status: None   Collection Time: 01/01/2020  2:28 PM   Specimen: BLOOD  Result Value Ref Range Status   Specimen Description   Final    BLOOD BLOOD LEFT FOREARM Performed at [place] [address], [place], [place] [address]    Special Requests   Final    BOTTLES DRAWN AEROBIC AND ANAEROBIC Blood Culture adequate volume Performed at [place] [address], [place],  [address]    Culture   Final    NO GROWTH 5 DAYS Performed at [facility], [place] [address], [place], [place]  [address]    Report Status 01/10/2020 FINAL  Final  SARS Coronavirus 2 by RT PCR (hospital order, performed in [facility] hospital lab) Nasopharyngeal Nasopharyngeal Swab     Status: None   Collection Time: 12/10/2019  2:28 PM   Specimen: Nasopharyngeal Swab  Result Value Ref Range Status   SARS Coronavirus 2 NEGATIVE NEGATIVE Final    Comment: (NOTE) SARS-CoV-2 target nucleic acids are NOT DETECTED.  The SARS-CoV-2 RNA is generally detectable in upper and lower respiratory specimens during the acute phase of infection. The lowest concentration of SARS-CoV-2 viral copies this assay can detect is 250 copies / mL. A negative result does not preclude SARS-CoV-2 infection and should not be used as the sole basis for treatment or other patient management decisions.  A negative result may occur with improper specimen collection / handling, submission of specimen other than nasopharyngeal swab, presence of viral mutation(s) within the areas targeted by this assay, and inadequate number of viral copies (<250 copies / mL). A negative result must be combined with clinical observations, patient history, and epidemiological information.  Fact Sheet for Patients:   StrictlyIdeas.no  Fact Sheet for Healthcare Providers: BankingDealers.co.za  This test is not yet approved or  cleared by the [place] FDA and has been authorized for detection and/or diagnosis of SARS-CoV-2 by FDA under an Emergency Use Authorization (EUA).  This EUA will remain in effect (meaning this test can be used) for the duration of the COVID-19 declaration under Section 564(b)(1) of the Act, 21 U.S.C. section 360bbb-3(b)(1), unless the authorization is terminated or revoked sooner.  Performed at [facility], [place]  [address], [place], [place] [address]   Blood culture (routine x 2)     Status: None   Collection Time: 12/15/2019  2:29 PM   Specimen: BLOOD  Result Value Ref Range Status   Specimen Description   Final    BLOOD LEFT HAND Performed at [place] [address] [place], [place] [address]    Special Requests   Final    BOTTLES DRAWN AEROBIC AND ANAEROBIC Blood Culture adequate volume Performed at [facility] [address], [place], [place] [address]    Culture   Final    NO GROWTH 5 DAYS Performed at [facility], [place] [address] [place], [place] [address]    Report Status 01/10/2020 FINAL  Final    Lab Basic Metabolic Panel: Recent Labs  Lab 12/11/2019 1428 01/03/2020 1428 01/06/20 0719 01/07/20 1104 01/08/20 0303 01/09/20 0255 01/10/20 0248  NA 138   < > 141 142 142 144 148*  K 3.7   < > 3.5 3.4* 3.8 4.1 4.1  CL 100  --  104  --  107 107 110  CO2 28  --  25  --  25 21* 25  GLUCOSE 116*  --  104*  --  113* 117* 111*  BUN 21  --  19  --  17 22 30*  CREATININE 0.59*  --  0.55*  --  0.43* 0.58* 0.77  CALCIUM 8.6*  --  8.8*  --  8.9 9.3 9.7  MG  --   --  1.9  --   --   --   --    < > = values in this interval not displayed.   Liver Function Tests: Recent Labs  Lab 12/19/2019 1428  AST 28  ALT 20  ALKPHOS 112  BILITOT 0.4  PROT 6.7  ALBUMIN  3.3*   No results for input(s): LIPASE, AMYLASE in the last 168 hours. No results for input(s): AMMONIA in the last 168 hours. CBC: Recent Labs  Lab 01/06/20 0719 01/06/20 0719 01/07/20 0631 01/07/20 1104 01/08/20 0303 01/09/20 0255 01/10/20 0248  WBC 20.9*  --  22.8*  --  23.0* 32.6* 33.0*  NEUTROABS 9.9*  --  10.8*  --  9.5* 13.0* 13.2*  HGB 13.5   < > 13.1 14.6 12.7* 13.6 13.0  HCT 41.9   < > 41.3 43.0 40.9 43.8 43.2  MCV 97.7  --  99.0  --  101.2* 100.9* 103.6*  PLT 333  --  329  --  317 391 397   < > = values in this interval not displayed.   Cardiac Enzymes: No  results for input(s): CKTOTAL, CKMB, CKMBINDEX, TROPONINI in the last 168 hours. Sepsis Labs: Recent Labs  Lab 12/29/2019 1428 01/06/20 0719 01/07/20 0631 01/08/20 0303 01/09/20 0255 01/10/20 0248  PROCALCITON  --   --   --  0.16 <0.10 0.12  WBC 19.4*   < > 22.8* 23.0* 32.6* 33.0*  LATICACIDVEN 1.6  --   --   --   --   --    < > = values in this interval not displayed.    Procedures/Operations  BiPAP   Patrecia Pour, MD 01-19-20, 12:43 PM

## 2020-02-09 DEATH — deceased
